# Patient Record
Sex: Male | Born: 1955
Health system: Southern US, Community
[De-identification: ages and names within clinical notes are randomized; demographics above are authoritative.]

## PROBLEM LIST (undated history)

## (undated) DIAGNOSIS — D649 Anemia, unspecified: Secondary | ICD-10-CM

## (undated) DIAGNOSIS — Z87442 Personal history of urinary calculi: Secondary | ICD-10-CM

## (undated) DIAGNOSIS — G4730 Sleep apnea, unspecified: Secondary | ICD-10-CM

## (undated) DIAGNOSIS — I10 Essential (primary) hypertension: Secondary | ICD-10-CM

## (undated) DIAGNOSIS — F32A Depression, unspecified: Secondary | ICD-10-CM

## (undated) DIAGNOSIS — F329 Major depressive disorder, single episode, unspecified: Secondary | ICD-10-CM

## (undated) DIAGNOSIS — M1990 Unspecified osteoarthritis, unspecified site: Secondary | ICD-10-CM

## (undated) DIAGNOSIS — E559 Vitamin D deficiency, unspecified: Secondary | ICD-10-CM

## (undated) DIAGNOSIS — G4700 Insomnia, unspecified: Secondary | ICD-10-CM

## (undated) DIAGNOSIS — I5031 Acute diastolic (congestive) heart failure: Secondary | ICD-10-CM

## (undated) DIAGNOSIS — R0600 Dyspnea, unspecified: Secondary | ICD-10-CM

## (undated) HISTORY — PX: CHOLECYSTECTOMY: SHX55

## (undated) HISTORY — DX: Morbid (severe) obesity due to excess calories: E66.01

## (undated) HISTORY — PX: HERNIA REPAIR: SHX51

## (undated) HISTORY — DX: Insomnia, unspecified: G47.00

## (undated) HISTORY — PX: ANKLE SURGERY: SHX546

## (undated) HISTORY — PX: APPENDECTOMY: SHX54

---
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
## 1998-07-04 ENCOUNTER — Ambulatory Visit (HOSPITAL_COMMUNITY): Admission: RE | Admit: 1998-07-04 | Discharge: 1998-07-04 | Payer: Self-pay | Admitting: Family Medicine

## 1998-07-10 ENCOUNTER — Encounter: Admission: RE | Admit: 1998-07-10 | Discharge: 1998-10-08 | Payer: Self-pay | Admitting: Family Medicine

## 1999-09-28 ENCOUNTER — Ambulatory Visit (HOSPITAL_BASED_OUTPATIENT_CLINIC_OR_DEPARTMENT_OTHER): Admission: RE | Admit: 1999-09-28 | Discharge: 1999-09-28 | Payer: Self-pay | Admitting: Urology

## 2004-06-15 ENCOUNTER — Emergency Department (HOSPITAL_COMMUNITY): Admission: EM | Admit: 2004-06-15 | Discharge: 2004-06-15 | Payer: Self-pay | Admitting: Family Medicine

## 2007-08-28 ENCOUNTER — Emergency Department (HOSPITAL_COMMUNITY): Admission: EM | Admit: 2007-08-28 | Discharge: 2007-08-28 | Payer: Self-pay | Admitting: Family Medicine

## 2007-09-14 ENCOUNTER — Encounter: Admission: RE | Admit: 2007-09-14 | Discharge: 2007-09-14 | Payer: Self-pay | Admitting: Gastroenterology

## 2008-05-09 ENCOUNTER — Emergency Department (HOSPITAL_COMMUNITY): Admission: EM | Admit: 2008-05-09 | Discharge: 2008-05-09 | Payer: Self-pay | Admitting: Emergency Medicine

## 2009-05-31 ENCOUNTER — Emergency Department (HOSPITAL_COMMUNITY): Admission: EM | Admit: 2009-05-31 | Discharge: 2009-05-31 | Payer: Self-pay

## 2011-02-15 NOTE — Op Note (Signed)
[place]. [facility]  Patient:    Jeffrey Skinner                         MRN: [id number] Proc. Date: 09/28/99 Adm. Date:  [id number] Attending:  Lindaann Slough                           Operative Report  PREOPERATIVE DIAGNOSIS:  Elective sterilization.  POSTOPERATIVE DIAGNOSIS:  Elective sterilization.  PROCEDURE:  Bilateral vasectomy.  SURGEON:  Lindaann Slough, M.D.  ANESTHESIA:  General.  INDICATIONS:  Patient is a 55 year old male, who would like to have a vasectomy. He was scheduled for the procedure in the office, but because of discomfort in he scrotal area, the procedure was scheduled to be done under anesthesia.  Patient has two children and the risks and benefits of the procedure were discussed with him and his wife and they are agreeable.  DESCRIPTION OF PROCEDURE:  Under general anesthesia, patient was prepped and draped and placed in the supine position.  A 1 cm incision was made on the right scrotum. The incision was carried down to the subcutaneous tissues.  The vas was secured  with an Allis clamp and brought out through the wound.  The vas was then dissected from the surrounding tissues.  A segment of the vas was excised.  The ends of the vas were fulgurated.  Each end of the vas was then reversed on each end and doubly ligated with #0 Vicryl.  The proximal end of the vas was then covered with subcutaneous tissues and the distal end of the vas was placed under the skin. he skin was then closed with #3-0 Vicryl.  The same procedure was done on the left  side.  The patient tolerated the procedure well and left the OR in satisfactory condition to post anesthesia care unit. DD:  09/28/99 TD:  09/29/99 Job: [id number]

## 2011-06-28 LAB — WOUND CULTURE

## 2017-01-08 ENCOUNTER — Emergency Department (HOSPITAL_COMMUNITY): Payer: 59

## 2017-01-08 ENCOUNTER — Observation Stay (HOSPITAL_COMMUNITY)
Admission: EM | Admit: 2017-01-08 | Discharge: 2017-01-10 | Disposition: A | Discharge status: Home / Self Care | Payer: 59 | Attending: Oncology | Admitting: Oncology

## 2017-01-08 ENCOUNTER — Encounter (HOSPITAL_COMMUNITY): Payer: Self-pay | Admitting: Emergency Medicine

## 2017-01-08 ENCOUNTER — Encounter (HOSPITAL_COMMUNITY): Payer: Self-pay

## 2017-01-08 ENCOUNTER — Ambulatory Visit (HOSPITAL_COMMUNITY)
Admission: EM | Admit: 2017-01-08 | Discharge: 2017-01-08 | Disposition: A | Discharge status: Nursing Home (Includes ICF) | Payer: 59 | Source: Home / Self Care

## 2017-01-08 DIAGNOSIS — R0603 Acute respiratory distress: Secondary | ICD-10-CM

## 2017-01-08 DIAGNOSIS — Z6841 Body Mass Index (BMI) 40.0 and over, adult: Secondary | ICD-10-CM | POA: Diagnosis not present

## 2017-01-08 DIAGNOSIS — G4730 Sleep apnea, unspecified: Secondary | ICD-10-CM | POA: Diagnosis not present

## 2017-01-08 DIAGNOSIS — R000 Tachycardia, unspecified: Secondary | ICD-10-CM | POA: Diagnosis not present

## 2017-01-08 DIAGNOSIS — R0602 Shortness of breath: Secondary | ICD-10-CM

## 2017-01-08 DIAGNOSIS — J9691 Respiratory failure, unspecified with hypoxia: Principal | ICD-10-CM | POA: Diagnosis present

## 2017-01-08 DIAGNOSIS — I10 Essential (primary) hypertension: Secondary | ICD-10-CM | POA: Insufficient documentation

## 2017-01-08 DIAGNOSIS — R0902 Hypoxemia: Secondary | ICD-10-CM

## 2017-01-08 LAB — CBC
HEMATOCRIT: 47.7 % (ref 39.0–52.0)
HEMOGLOBIN: 14.8 g/dL (ref 13.0–17.0)
MCH: 29.1 pg (ref 26.0–34.0)
MCHC: 31 g/dL (ref 30.0–36.0)
MCV: 93.7 fL (ref 78.0–100.0)
Platelets: 199 10*3/uL (ref 150–400)
RBC: 5.09 MIL/uL (ref 4.22–5.81)
RDW: 14.1 % (ref 11.5–15.5)
WBC: 10.4 10*3/uL (ref 4.0–10.5)

## 2017-01-08 LAB — BASIC METABOLIC PANEL
ANION GAP: 7 (ref 5–15)
BUN: 7 mg/dL (ref 6–20)
CHLORIDE: 98 mmol/L — AB (ref 101–111)
CO2: 33 mmol/L — ABNORMAL HIGH (ref 22–32)
Calcium: 8.7 mg/dL — ABNORMAL LOW (ref 8.9–10.3)
Creatinine, Ser: 1.05 mg/dL (ref 0.61–1.24)
GFR calc Af Amer: >60 mL/min (ref >60)
GLUCOSE: 142 mg/dL — AB (ref 65–99)
POTASSIUM: 3.9 mmol/L (ref 3.5–5.1)
Sodium: 138 mmol/L (ref 135–145)

## 2017-01-08 LAB — BRAIN NATRIURETIC PEPTIDE: B NATRIURETIC PEPTIDE 5: 10.5 pg/mL (ref 0.0–100.0)

## 2017-01-08 LAB — I-STAT TROPONIN, ED: Troponin i, poc: 0 ng/mL (ref 0.00–0.08)

## 2017-01-08 MED ORDER — ALBUTEROL SULFATE (2.5 MG/3ML) 0.083% IN NEBU
INHALATION_SOLUTION | RESPIRATORY_TRACT | Status: AC
  Filled 2017-01-08: qty 3

## 2017-01-08 MED ORDER — SODIUM CHLORIDE 0.9 % IN NEBU
INHALATION_SOLUTION | RESPIRATORY_TRACT | Status: AC
  Filled 2017-01-08: qty 3

## 2017-01-08 MED ORDER — IPRATROPIUM-ALBUTEROL 0.5-2.5 (3) MG/3ML IN SOLN
3.0000 mL | Freq: Once | RESPIRATORY_TRACT | Status: AC
  Administered 2017-01-08: 3 mL via RESPIRATORY_TRACT
  Filled 2017-01-08: qty 3

## 2017-01-08 MED ORDER — IOPAMIDOL (ISOVUE-370) INJECTION 76%
INTRAVENOUS | Status: AC
  Administered 2017-01-08: 100 mL
  Filled 2017-01-08: qty 100

## 2017-01-08 MED ORDER — ALBUTEROL SULFATE (2.5 MG/3ML) 0.083% IN NEBU
2.5000 mg | INHALATION_SOLUTION | Freq: Once | RESPIRATORY_TRACT | Status: AC
  Administered 2017-01-08: 2.5 mg via RESPIRATORY_TRACT

## 2017-01-08 NOTE — ED Provider Notes (Signed)
CSN: [id number]     Arrival date & time 01/08/17  1617 History   None    Chief Complaint  Patient presents with  . Shortness of Breath   (Consider location/radiation/quality/duration/timing/severity/associated sxs/prior Treatment) Patient c/o severe SOB.  He states he has had breathing problems for last 6 months.  He states he has come in because his wife has told him he needs to see a doctor.  He has severe sleep apnea untreated and he has developed worsening breathing problems over last few days.  He states he is afraid to go to sleep.   The history is provided by the patient.  Shortness of Breath  Severity:  Severe Onset quality:  Sudden Progression:  Worsening Chronicity:  New Relieved by:  None tried Worsened by:  Nothing Ineffective treatments:  None tried   History reviewed. No pertinent past medical history. Past Surgical History:  Procedure Laterality Date  . APPENDECTOMY    . HERNIA REPAIR     No family history on file. Social History  Substance Use Topics  . Smoking status: Never Smoker  . Smokeless tobacco: Not on file  . Alcohol use Yes    Review of Systems  Constitutional: Negative.   HENT: Negative.   Eyes: Negative.   Respiratory: Positive for shortness of breath.   Cardiovascular: Negative.   Gastrointestinal: Negative.   Endocrine: Negative.   Genitourinary: Negative.   Musculoskeletal: Negative.   Allergic/Immunologic: Negative.   Neurological: Negative.   Hematological: Negative.     Allergies  Patient has no known allergies.  Home Medications   Prior to Admission medications   Not on File   Meds Ordered and Administered this Visit   Medications  albuterol (PROVENTIL) (2.5 MG/3ML) 0.083% nebulizer solution 2.5 mg (2.5 mg Nebulization Given 01/08/17 1650)    Resp (!) 32 Comment: after walking  SpO2 97% Comment: with treatment No data found.   Physical Exam  Constitutional: He is oriented to person, place, and time. He appears  well-developed and well-nourished.  HENT:  Head: Normocephalic and atraumatic.  Eyes: Conjunctivae and EOM are normal. Pupils are equal, round, and reactive to light.  Neck: Normal range of motion. Neck supple.  Cardiovascular: Normal rate, regular rhythm and normal heart sounds.   Pulmonary/Chest: He is in respiratory distress.  Bilateral Breath sounds diminished throughout and patient has tachypnea.  Abdominal: Soft. Bowel sounds are normal.  Musculoskeletal: Normal range of motion.  Neurological: He is alert and oriented to person, place, and time.  Nursing note and vitals reviewed.   Urgent Care Course     Procedures (including critical care time)  Labs Review Labs Reviewed - No data to display  Imaging Review No results found.   Visual Acuity Review  Right Eye Distance:   Left Eye Distance:   Bilateral Distance:    Right Eye Near:   Left Eye Near:    Bilateral Near:         MDM   1. SOB (shortness of breath)   2. Respiratory distress    Neb treatment with albuterol 2.5/3cc's now  Patient advised he needs to go to ED for higher level of care.    Deatra Canter, FNP 01/08/17 1715

## 2017-01-08 NOTE — Discharge Instructions (Signed)
Go straight to Emergency Room °

## 2017-01-08 NOTE — ED Notes (Signed)
Patient transported to CT 

## 2017-01-08 NOTE — ED Provider Notes (Signed)
Emergency Department Provider Note   I have reviewed the triage vital signs and the nursing notes.   HISTORY  Chief Complaint Shortness of Breath   HPI Jeffrey Skinner is a 61 y.o. male with no PMH and no PCP the emergency room in for evaluation of acute on chronic difficulty breathing. Patient states he's had difficulty breathing over the past year but has become significantly worse over the past one to months. Describes significant dyspnea with even minimal amounts of movement. States that his truck is approximately 30 feet outside his house and by the time he gets there is very winded. He denies any chest pain. No fevers or chills. No productive cough. He has noticed some lower extremity edema that seems new but cannot describe exactly when he first noticed it. He presented to the emergency department today because he describes symptoms to his daughter insisted he go. 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  History reviewed. No pertinent past medical history.  There are no active problems to display for this patient.   Past Surgical History:  Procedure Laterality Date  . APPENDECTOMY    . HERNIA REPAIR        Allergies Patient has no known allergies.  No family history on file.  Social History Social History  Substance Use Topics  . Smoking status: Never Smoker  . Smokeless tobacco: Never Used  . Alcohol use Yes    Review of Systems  Constitutional: No fever/chills Eyes: No visual changes. ENT: No sore throat. Cardiovascular: Denies chest pain. Respiratory: Positive shortness of breath. Gastrointestinal: No abdominal pain.  No nausea, no vomiting.  No diarrhea.  No constipation. Genitourinary: Negative for dysuria. Musculoskeletal: Negative for back pain. Positive LE edema.  Skin: Negative for rash. Neurological: Negative for headaches, focal weakness or numbness.  10-point ROS otherwise  negative.  ____________________________________________   PHYSICAL EXAM:  VITAL SIGNS: ED Triage Vitals  Enc Vitals Group     BP 01/08/17 1726 140/81     Pulse Rate 01/08/17 1726 (!) 103     Resp --      Temp 01/08/17 1726 97.7 F (36.5 C)     SpO2 01/08/17 1726 90 %     Pain Score 01/08/17 1844 4   Constitutional: Alert and oriented. Well appearing and in no acute distress. Obese. Becomes dyspneic with talking or moving in bed.  Eyes: Conjunctivae are normal.  Head: Atraumatic. Nose: No congestion/rhinnorhea. Mouth/Throat: Mucous membranes are moist.   Neck: No stridor.  Cardiovascular: Tachcycardia. Good peripheral circulation. Grossly normal heart sounds.   Respiratory: Normal respiratory effort.  No retractions. Lungs CTAB. Gastrointestinal: Soft and nontender. No distention.  Musculoskeletal: No lower extremity tenderness nor edema. No gross deformities of extremities. Neurologic:  Normal speech and language. No gross focal neurologic deficits are appreciated.  Skin:  Skin is warm, dry and intact. No rash noted.  ____________________________________________   LABS (all labs ordered are listed, but only abnormal results are displayed)  Labs Reviewed  BASIC METABOLIC PANEL - Abnormal; Notable for the following:       Result Value   Chloride 98 (*)    CO2 33 (*)    Glucose, Bld 142 (*)    Calcium 8.7 (*)    All other components within normal limits  CBC  BRAIN NATRIURETIC PEPTIDE  I-STAT TROPOININ, ED   ____________________________________________  EKG   EKG Interpretation  Date/Time:  Wednesday January 08 2017  17:37:14 EDT Ventricular Rate:  107 PR Interval:  148 QRS Duration: 84 QT Interval:  342 QTC Calculation: 456 R Axis:   -5 Text Interpretation:  Sinus tachycardia Nonspecific T wave abnormality Abnormal ECG No STEMI.  Confirmed by Jinx Gilden MD, Hannahgrace Lalli [phone]) on 01/08/2017 8:07:02 PM        ____________________________________________  RADIOLOGY  Dg Chest 2 View  Result Date: 01/08/2017 CLINICAL DATA:  Shortness of breath x6 months EXAM: CHEST  2 VIEW COMPARISON:  None. FINDINGS: Lungs are clear.  No pleural effusion or pneumothorax. The heart is normal in size. Visualized osseous structures are within normal limits. IMPRESSION: Normal chest radiographs. Electronically Signed   By: Charline Bills M.D.   On: 01/08/2017 18:22   Ct Angio Chest Pe W And/or Wo Contrast  Result Date: 01/08/2017 CLINICAL DATA:  Chronic shortness of breath.  Initial encounter. EXAM: CT ANGIOGRAPHY CHEST WITH CONTRAST TECHNIQUE: Multidetector CT imaging of the chest was performed using the standard protocol during bolus administration of intravenous contrast. Multiplanar CT image reconstructions and MIPs were obtained to evaluate the vascular anatomy. The study was repeated due to limitations in the timing of the contrast bolus. CONTRAST:  175 mL of Isovue 370 IV contrast COMPARISON:  Chest radiograph performed earlier today at 6:08 p.m. FINDINGS: Cardiovascular: There is no evidence of central pulmonary embolus. Evaluation for pulmonary embolus is suboptimal due to limitations in the timing of the contrast bolus. The heart is borderline normal in size. The great vessels are grossly unremarkable in appearance. The thoracic aorta is grossly unremarkable. Mediastinum/Nodes: Calcified mediastinal nodes are noted at the azygoesophageal recess. No mediastinal lymphadenopathy is seen. The visualized portions of the thyroid gland are unremarkable. No axial lymphadenopathy is appreciated. Lungs/Pleura: Mild bilateral atelectasis is noted. No pleural effusion or pneumothorax is seen. A calcified granuloma is noted at the right lung base. No masses are identified. Upper Abdomen: The visualized portions of the liver and spleen are unremarkable. The visualized portions of the gallbladder, pancreas, adrenal glands and  kidneys are within normal limits. Musculoskeletal: No acute osseous abnormalities are identified. The visualized musculature is unremarkable in appearance. Review of the MIP images confirms the above findings. IMPRESSION: 1. No evidence of central pulmonary embolus. 2. Mild bilateral atelectasis noted. 3. Calcified mediastinal nodes likely reflect remote granulomatous disease. Electronically Signed   By: Roanna Raider M.D.   On: 01/08/2017 23:29    ____________________________________________   PROCEDURES  Procedure(s) performed:   Procedures  None ____________________________________________   INITIAL IMPRESSION / ASSESSMENT AND PLAN / ED COURSE  Pertinent labs & imaging results that were available during my care of the patient were reviewed by me and considered in my medical decision making (see chart for details).  Patient presents to the emergency department for evaluation of acute on chronic dyspnea. He is not a primary care physician and no history of asthma, COPD, congestive heart failure. No chest pain symptoms. Has a large body habitus which may be driving some of his respiratory difficulties. He was 88% on room air in the urgent care and started on oxygen and transferred here to the emergency department. He has very faint mostly right upper old expiratory wheezing. No smoking history. Plan for DuoNeb, chest x-ray, additional lab work including BNP.   08:08 PM Lab work is largely unremarkable. Patient ambulated with pulse ox and oxygen dropped to 84% on room air after his DuoNeb treatment. No chest pain. The patient has notable dyspnea. Plan for CT angiogram the chest given  otherwise unremarkable workup and hypoxemia of unknown etiology.  11:39 PM Patient with normal CT angio. Hypoxemia with any significant movement or ambulation. Lowest O2 sat or 84% with ambulation. Plan feeling slightly better after Duonebs so unclear if there is an underlying COPD component vs new CHF.    Discussed patient's case with IM teaching team. Patient and family (if present) updated with plan. Care transferred to IM teaching service.  I reviewed all nursing notes, vitals, pertinent old records, EKGs, labs, imaging (as available).  ____________________________________________  FINAL CLINICAL IMPRESSION(S) / ED DIAGNOSES  Final diagnoses:  Hypoxia  SOB (shortness of breath)     MEDICATIONS GIVEN DURING THIS VISIT:  Medications  ipratropium-albuterol (DUONEB) 0.5-2.5 (3) MG/3ML nebulizer solution 3 mL (not administered)  ipratropium-albuterol (DUONEB) 0.5-2.5 (3) MG/3ML nebulizer solution 3 mL (3 mLs Nebulization Given 01/08/17 1908)  iopamidol (ISOVUE-370) 76 % injection (100 mLs  Contrast Given 01/08/17 2228)     NEW OUTPATIENT MEDICATIONS STARTED DURING THIS VISIT:  None   Note:  This document was prepared using Dragon voice recognition software and may include unintentional dictation errors.  Alona Bene, MD Emergency Medicine   Maia Plan, MD 01/09/17 0005

## 2017-01-08 NOTE — ED Notes (Signed)
Patient denies a plan to harm self.  Patient just desperate for a change, for feeling better, for losing weight.  "anything's got to be better than this"

## 2017-01-08 NOTE — ED Triage Notes (Signed)
Pt reports feeling short of breath for six months. Reports it is starting to scare him. Denies any pain. Reports some mild swelling in his ankles.  Patient is in no apparent distress in triage. Was 90% on RA and 96% once placed on 2 l [place].

## 2017-01-08 NOTE — ED Triage Notes (Signed)
Patient sob and audible upper airway wheezing while ambulating from lobby to treatment room.  Notified provider.    Patient has been sob for 6 months, for the last 2 months has been "afraid to go to sleep" due to breathing concerns. patient in department today due to daughter nagging him to come.   Denies chest pain.

## 2017-01-09 DIAGNOSIS — R0602 Shortness of breath: Secondary | ICD-10-CM | POA: Insufficient documentation

## 2017-01-09 DIAGNOSIS — R0683 Snoring: Secondary | ICD-10-CM | POA: Diagnosis not present

## 2017-01-09 DIAGNOSIS — J9691 Respiratory failure, unspecified with hypoxia: Secondary | ICD-10-CM | POA: Diagnosis present

## 2017-01-09 DIAGNOSIS — Z809 Family history of malignant neoplasm, unspecified: Secondary | ICD-10-CM | POA: Diagnosis not present

## 2017-01-09 DIAGNOSIS — R0902 Hypoxemia: Secondary | ICD-10-CM

## 2017-01-09 DIAGNOSIS — I10 Essential (primary) hypertension: Secondary | ICD-10-CM | POA: Diagnosis not present

## 2017-01-09 LAB — BASIC METABOLIC PANEL
Anion gap: 7 (ref 5–15)
BUN: 7 mg/dL (ref 6–20)
CHLORIDE: 99 mmol/L — AB (ref 101–111)
CO2: 33 mmol/L — AB (ref 22–32)
CREATININE: 0.85 mg/dL (ref 0.61–1.24)
Calcium: 8.4 mg/dL — ABNORMAL LOW (ref 8.9–10.3)
GFR calc non Af Amer: >60 mL/min (ref >60)
Glucose, Bld: 108 mg/dL — ABNORMAL HIGH (ref 65–99)
Potassium: 4.1 mmol/L (ref 3.5–5.1)
Sodium: 139 mmol/L (ref 135–145)

## 2017-01-09 LAB — LIPID PANEL
Cholesterol: 157 mg/dL (ref 0–200)
HDL: 40 mg/dL — AB (ref >40)
LDL CALC: 108 mg/dL — AB (ref 0–99)
TRIGLYCERIDES: 45 mg/dL (ref <150)
Total CHOL/HDL Ratio: 3.9 RATIO
VLDL: 9 mg/dL (ref 0–40)

## 2017-01-09 LAB — CBC
HCT: 46.4 % (ref 39.0–52.0)
Hemoglobin: 14.2 g/dL (ref 13.0–17.0)
MCH: 28.8 pg (ref 26.0–34.0)
MCHC: 30.6 g/dL (ref 30.0–36.0)
MCV: 94.1 fL (ref 78.0–100.0)
PLATELETS: 189 10*3/uL (ref 150–400)
RBC: 4.93 MIL/uL (ref 4.22–5.81)
RDW: 14.5 % (ref 11.5–15.5)
WBC: 10.4 10*3/uL (ref 4.0–10.5)

## 2017-01-09 LAB — TROPONIN I
Troponin I: <0.03 ng/mL (ref <0.03)
Troponin I: <0.03 ng/mL (ref <0.03)

## 2017-01-09 LAB — HEMOGLOBIN A1C
Hgb A1c MFr Bld: 5.5 % (ref 4.8–5.6)
Mean Plasma Glucose: 111 mg/dL

## 2017-01-09 LAB — MRSA PCR SCREENING: MRSA BY PCR: NEGATIVE

## 2017-01-09 LAB — TSH: TSH: 2.692 u[IU]/mL (ref 0.350–4.500)

## 2017-01-09 LAB — HIV ANTIBODY (ROUTINE TESTING W REFLEX): HIV SCREEN 4TH GENERATION: NONREACTIVE

## 2017-01-09 MED ORDER — LISINOPRIL 10 MG PO TABS
10.0000 mg | ORAL_TABLET | Freq: Every day | ORAL | Status: DC
  Administered 2017-01-09 – 2017-01-10 (×3): 10 mg via ORAL
  Filled 2017-01-09 (×3): qty 1

## 2017-01-09 MED ORDER — ENOXAPARIN SODIUM 120 MG/0.8ML ~~LOC~~ SOLN
110.0000 mg | SUBCUTANEOUS | Status: DC
  Administered 2017-01-09: 110 mg via SUBCUTANEOUS
  Filled 2017-01-09 (×2): qty 0.73

## 2017-01-09 MED ORDER — SENNOSIDES-DOCUSATE SODIUM 8.6-50 MG PO TABS
1.0000 | ORAL_TABLET | Freq: Every evening | ORAL | Status: DC | PRN
  Filled 2017-01-09: qty 1

## 2017-01-09 MED ORDER — SODIUM CHLORIDE 0.9% FLUSH
3.0000 mL | Freq: Two times a day (BID) | INTRAVENOUS | Status: DC
  Administered 2017-01-09 – 2017-01-10 (×2): 3 mL via INTRAVENOUS

## 2017-01-09 MED ORDER — ALBUTEROL SULFATE (2.5 MG/3ML) 0.083% IN NEBU: 2.5000 mg | INHALATION_SOLUTION | RESPIRATORY_TRACT | Status: DC | PRN

## 2017-01-09 MED ORDER — PREDNISONE 20 MG PO TABS
40.0000 mg | ORAL_TABLET | Freq: Every day | ORAL | Status: DC
  Administered 2017-01-09 – 2017-01-10 (×2): 40 mg via ORAL
  Filled 2017-01-09 (×2): qty 2

## 2017-01-09 MED ORDER — IPRATROPIUM-ALBUTEROL 0.5-2.5 (3) MG/3ML IN SOLN
3.0000 mL | Freq: Four times a day (QID) | RESPIRATORY_TRACT | Status: DC
  Administered 2017-01-09 (×3): 3 mL via RESPIRATORY_TRACT
  Filled 2017-01-09 (×3): qty 3

## 2017-01-09 MED ORDER — ACETAMINOPHEN 325 MG PO TABS: 650.0000 mg | ORAL_TABLET | Freq: Four times a day (QID) | ORAL | Status: DC | PRN

## 2017-01-09 MED ORDER — ACETAMINOPHEN 650 MG RE SUPP: 650.0000 mg | Freq: Four times a day (QID) | RECTAL | Status: DC | PRN

## 2017-01-09 MED ORDER — IPRATROPIUM-ALBUTEROL 0.5-2.5 (3) MG/3ML IN SOLN
3.0000 mL | Freq: Two times a day (BID) | RESPIRATORY_TRACT | Status: DC
Start: 2017-01-10 — End: 2017-01-10
  Administered 2017-01-10: 3 mL via RESPIRATORY_TRACT
  Filled 2017-01-09: qty 3

## 2017-01-09 NOTE — Progress Notes (Signed)
Patient admitted to Room 35 from ED via stretcher, self ambulated to bed. Is being admitted for SOB X 6 months, and worse in the last two months.  This 60 year old male is alert and oriented X 4, lives at home with wife. Armband verified. Placed on telemetry per order, box number 18, and had a second verifier.  Has a LAC IV which is saline locked.  Currently on 2 L 02 via nasal cannula. He is to have routine vitals and daily weights.  MRSA swab sent to lab as he has a history of MRSA. Test Pending. Patient weighs 511 lbs. Bariatric bed will be ordered.  Safety maintained.

## 2017-01-09 NOTE — H&P (Signed)
Date: 01/09/2017               Patient Name:  Jeffrey Skinner MRN: [id number]  DOB: 1956-08-14 Age / Sex: 61 y.o., male   PCP: No Pcp Per Patient         Medical Service: Internal Medicine Teaching Service         Attending Physician: Dr. Levert Feinstein, MD    First Contact: Dr. Reymundo Poll  Pager: [phone]  Second Contact: Dr. Deneise Lever Pager: [phone]       After Hours (After 5p/  First Contact Pager: [phone]  weekends / holidays): Second Contact Pager: [phone]   Chief Complaint: Shortness of breath  History of Present Illness: The patient is a 61 year old obese African-American male with no significant past medical history who presents to the emergency department with shortness of breath. The patient states that he has had shortness of breath for the prior 6 months. He describes his shortness of breath as episodes that occur with walking from his house to his truck and from his truck back to his house. These episodes of shortness of breath last between 3 and 5 minutes. There is no associated cough. They are improved with rest. He states they have gotten more frequent and severe over the previous 2 months. He presents to the emergency department today as his daughter persuaded him to come. His symptoms are not currently acutely worse than they have been over the previous 2 months. He denies chest pain, tightness or pressure with these episodes. He does state that he has been told he snores very loudly at night and has episodes where it appears that he stops breathing. He has no history of asthma, allergies or eczema as a child. He denies headache or changes in vision. He denies nausea, vomiting or abdominal pain. He denies diarrhea or constipation. He denies night sweats, fatigue or weight loss. 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  In the emergency department the patient was hypertensive with a blood pressure 146/84, and  tachypneic with a respiratory rate ranging from 18-25, satting 95-98% on 3 L oxygen via nasal cannula. Basic metabolic panel showed an elevated bicarbonate 33 and glucose at 142. CBC was within normal limits. I-STAT troponin negative. BNP not elevated. A two-view diagnostic chest radiograph showed no acute cardiopulmonary abnormality. CT angiography chest showed no evidence of central pulmonary embolism, mild bilateral atelectasis noted and calcified mediastinal nodes which may reflect remote granulomatous disease. He was treated with duo nebulizers and given 40 mg of oral prednisone. He was then admitted to the teaching service for further workup and management.  Meds:  The patient takes no daily medications.   Allergies: Allergies as of 01/08/2017  . (No Known Allergies)   History reviewed. No pertinent past medical history.  Family History: No family history of asthma. Family history of cancer in the patient's mother and brother although he does not remember the type.   Social History: Denies tobacco or illicit drug use. Drinks socially.  Review of Systems: A complete ROS was negative except as per HPI.   Physical Exam: Blood pressure (!) 146/84, pulse 88, temperature 97.7 F (36.5 C), resp. rate 18, SpO2 96 %. Physical Exam  Constitutional: He is oriented to person, place, and time. He appears well-developed and well-nourished.  Morbidly obese  HENT:  Head: Normocephalic and atraumatic.  Nasal cannula in place  Cardiovascular:  Normal rate and regular rhythm.  Exam reveals no gallop and no friction rub.   No murmur heard. Heart sounds distant and difficult to auscultate secondary to patient's body habitus  Respiratory: He has wheezes.  Bilateral expiratory wheezes  GI: Soft. Bowel sounds are normal. He exhibits no distension.  Musculoskeletal: He exhibits edema.  Bilateral lower extremity edema  Neurological: He is alert and oriented to person, place, and time.     EKG:  Sinus tachycardia, no acute ST segment elevation  CXR: No acute cardiopulmonary abnormality  Assessment & Plan by Problem: Active Problems:   Respiratory failure with hypoxia [facility]) The patient is a 61 year old morbidly obese African-American gentleman with no significant past medical history who presents with a 6 month history of exertional shortness of breath.  # Hypoxic respiratory failure with unspecified chronicity Patient has a six-month history of shortness of breath. No history of asthma. Plain film of the chest normal. CT angiography chest negative for pulmonary embolism. Physical examination shows morbidly obese body habitus and bilateral expiratory wheeze. Patient does have occupational exposure to burned wood and cole. He also has a history of snoring with episodes where it appears he is not breathing. The exact etiology of his respiratory failure is unclear at this time although I suspect components of both obstructive sleep apnea and obesity hypoventilation syndrome. Additionally, the patient may have reactive airway disease secondary to his occupational exposure. Also, although less likely he may have late onset asthma. CT findings showed calcified lymphadenopathy suggestive of prior granulomatous disease. This may also be playing a role in the patient's presentation. COPD is also on the differential given the patient's occupational exposure. Patient does have bilateral lower extremity edema and history of dyspnea on exertion. He may have some component of heart failure although his BNP is normal. I think he would also benefit from an echo while inpatient. -- Duo nebs scheduled every 6 hours -- Albuterol every 2 hours as needed for wheezing -- Prednisone 40 mg 5 days -- Echocardiogram -- Would benefit from sleep study in the outpatient setting -- Would benefit from pulmonary function testing in the outpatient setting  # Probable obstructive sleep apnea # Probable obesity  hypoventilation syndrome Patient's history and body habitus suggestive of both obstructive sleep apnea and obesity hypoventilation syndrome. These etiologies may be contributing to his hypertension and also makes it more difficult to lose weight. They may also be contributing to his chronic daily shortness of breath. -- Outpatient workup  # Hypertension Currently, does not carry a diagnosis of hypertension. He is hypertensive since admission. -- Lisinopril 10 mg daily -- Hemoglobin A1c -- Lipid panel  DVT/PE prophylaxis: Lovenox FEN/GI: Normal diet Code: Full code  Dispo: Admit patient to Observation with expected length of stay less than 2 midnights.  Signed: Thomasene Lot, MD 01/09/2017, 12:32 AM  Pager: [phone]

## 2017-01-09 NOTE — Progress Notes (Signed)
Called portable and ordered bariatric bed.

## 2017-01-09 NOTE — Progress Notes (Signed)
Called ED to get report on Patient. Spoke to [place], who said he would call me back.  Awaiting call back.

## 2017-01-09 NOTE — Progress Notes (Signed)
PFT lab notified of PFT order.

## 2017-01-09 NOTE — Progress Notes (Signed)
Called portable ordered K pad

## 2017-01-09 NOTE — Progress Notes (Signed)
SATURATION QUALIFICATIONS: (This note is used to comply with regulatory documentation for home oxygen)  Patient Saturations on Room Air at Rest = 87%      

## 2017-01-09 NOTE — Progress Notes (Signed)
   Subjective: Patient was sleeping comfortably on ounces morning. He was snoring very loudly. He awoke easily. He had no complaints, breathing has improved. Inquiring about discharge.   Objective:  Vital signs in last 24 hours: Vitals:   01/09/17 0129 01/09/17 0300 01/09/17 0611 01/09/17 1059  BP: (!) 168/112 (!) 153/88 (!) 156/63 (!) 154/52  Pulse: 87 79 85   Resp: Temp: 97.8 F (36.6 C) 97.7 F (36.5 C) 98.6 F (37 C)   TempSrc:  Oral Oral   SpO2: 98% 97% 96%   Weight: (!) 511 lb (231.8 kg) (!) 511 lb (231.8 kg)    Height:  (1.956 m)  (1.956 m)     Physical Exam Constitutional: Morbidly obese, NAD, appears comfortable HEENT: Atraumatic, normocephalic. PERRL, anicteric sclera.  Neck: Supple, trachea midline.  Cardiovascular: Distant heart sounds, RRR, no murmurs, rubs, or gallops.  Pulmonary/Chest: Limited due to habitus but CTAB Abdominal: Soft, non tender, distended due to habitus. +BS.  Extremities: Warm and well perfused. Non pitting lower extremity edema. Neurological: A&Ox3, CN II - XII grossly intact.   Assessment/Plan:  The patient is a 61 year old morbidly obese African-American gentleman with no significant past medical history who presents with a 6 month history of exertional shortness of breath.  Hypoxic Respiratory Failure: Unclear chronicity. Patient presented to the ED with 6 month history of SOB and dyspnea on exertion. On arrival to the ED he was noted to be hypoxic to 88% and tachypneic with RR 15-25. Oxygenation improved to 95-98% on 3L [facility]. Chest xray was unremarkable and CTA chest was negative for acute PE, but notable for calcified mediastinal nodes. He was also found to have bilateral wheezing on exam and was treated with albuterol and duonebs. 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 He is also morbidly obese and endorses  symptoms concerning for OSA (snoring, headaches, and intermittent confusion in the mornings). Give his mediastinal nodes, sarcoidosis is certainly on the differential as well. For now we will continue with breathing treatments, obtain PFTs, and plan for outpatient sleep study after discharge.  -- Wean oxygen as tolerated -- Albuterol q2 prn  -- Duonebs q6 scheduled -- Prednisone 40 mg x 5 days  -- Echocardiogram  -- PFTs  -- Will refer for sleep study on discharge   HTN: Elevated BP on admission, does not take any medications at home and has no formal HTN diagnosis.  -- Continue lisinopril 10 mg daily  -- A1C pending  -- Lipid panel >> Total cholesterol 157, HDL 40, LDL 108   FEN: No fluids, replete lytes prn, regular diet VTE ppx: Lovenox  Code Status: FULL   Dispo: Anticipated discharge in approximately 1-2 day(s).   Reymundo Poll, MD 01/09/2017, 1:03 PM Pager: [phone]

## 2017-01-10 ENCOUNTER — Telehealth: Payer: Self-pay | Admitting: Internal Medicine

## 2017-01-10 ENCOUNTER — Observation Stay (HOSPITAL_BASED_OUTPATIENT_CLINIC_OR_DEPARTMENT_OTHER): Payer: 59

## 2017-01-10 DIAGNOSIS — J9691 Respiratory failure, unspecified with hypoxia: Secondary | ICD-10-CM | POA: Diagnosis not present

## 2017-01-10 DIAGNOSIS — Z9981 Dependence on supplemental oxygen: Secondary | ICD-10-CM | POA: Diagnosis not present

## 2017-01-10 DIAGNOSIS — R0600 Dyspnea, unspecified: Secondary | ICD-10-CM

## 2017-01-10 DIAGNOSIS — I10 Essential (primary) hypertension: Secondary | ICD-10-CM | POA: Diagnosis not present

## 2017-01-10 DIAGNOSIS — Z6841 Body Mass Index (BMI) 40.0 and over, adult: Secondary | ICD-10-CM

## 2017-01-10 MED ORDER — TIOTROPIUM BROMIDE MONOHYDRATE 1.25 MCG/ACT IN AERS: 2.0000 | INHALATION_SPRAY | Freq: Every day | RESPIRATORY_TRACT | 2 refills | Status: DC

## 2017-01-10 MED ORDER — ALBUTEROL SULFATE HFA 108 (90 BASE) MCG/ACT IN AERS: 2.0000 | INHALATION_SPRAY | Freq: Four times a day (QID) | RESPIRATORY_TRACT | 2 refills | Status: DC | PRN

## 2017-01-10 MED ORDER — PREDNISONE 20 MG PO TABS: 40.0000 mg | ORAL_TABLET | Freq: Every day | ORAL | 0 refills | Status: DC

## 2017-01-10 MED ORDER — LISINOPRIL 10 MG PO TABS: 10.0000 mg | ORAL_TABLET | Freq: Every day | ORAL | 1 refills | Status: DC

## 2017-01-10 NOTE — Care Management Note (Signed)
Case Management Note  Patient Details  Name: PERKINS MOLINA MRN: [id number] Date of Birth: 12/28/1955  Subjective/Objective:              Admitted with respiratory failure with hypoxia.      Action/Plan: Plan is to d/c to home today.  Expected Discharge Date:  01/10/17               Expected Discharge Plan:  Home/Self Care  In-House Referral:     Discharge planning Services  CM Consult  Post Acute Care Choice:    Choice offered to:  Patient  DME Arranged:  Oxygen DME Agency:  [facility] (referral made with jermaine @ [phone])  HH Arranged:    HH Agency:     Status of Service:  Completed, signed off  If discussed at [employer] of Stay Meetings, dates discussed:    Additional Comments:  Epifanio Lesches, RN 01/10/2017, 4:25 PM

## 2017-01-10 NOTE — Progress Notes (Signed)
SATURATION QUALIFICATIONS: (This note is used to comply with regulatory documentation for home oxygen)  Patient Saturations on Room Air at Rest = 94%  Patient Saturations on Room Air while Ambulating = 77%  Patient Saturations on 1 Liters of oxygen while Ambulating = 97%

## 2017-01-10 NOTE — Telephone Encounter (Signed)
Needs TOC Discharge date 01/10/17 HFU 01/24/17

## 2017-01-10 NOTE — Progress Notes (Signed)
Jeffrey Skinner to be D/C'd  To home with home health O2 once echo has been completed per MD order. Spoke to MD about a cardiologist not been available to read echo today. MD aware and okay to proceed with discharge. Discussed with the patient and all questions fully answered.  VSS, Skin clean, dry and intact without evidence of skin break down, no evidence of skin tears noted. IV catheter discontinued intact. Site without signs and symptoms of complications. Dressing and pressure applied.  An After Visit Summary was printed and given to the patient. Patient received prescriptions and oxygen to room.  D/c education completed with patient/family including follow up instructions, medication list, d/c activities limitations if indicated, with other d/c instructions as indicated by MD - patient able to verbalize understanding, all questions fully answered.   Patient instructed to return to ED, call 911, or call MD for any changes in condition.   Patient escorted via WC, and D/C home via private auto.  [name] 01/10/2017 6:09 PM

## 2017-01-10 NOTE — Progress Notes (Signed)
  Echocardiogram 2D Echocardiogram has been performed.  Fawaz Borquez T Suzie Vandam 01/10/2017, 6:36 PM

## 2017-01-10 NOTE — Progress Notes (Signed)
   Subjective: Patient feels well this morning. Reports his breathing has improved with the breathing treatments. He is off oxygen. Eager for discharge.   Objective:  Vital signs in last 24 hours: Vitals:   01/09/17 2027 01/09/17 2157 01/10/17 0548 01/10/17 1041  BP:  (!) 131/58 138/76 (!) 160/83  Pulse:  86 85   Resp:  19 18   Temp:  97.9 F (36.6 C) 98.1 F (36.7 C)   TempSrc:   Oral   SpO2: 96% 94%    Weight:      Height:       Physical Exam Constitutional: Morbidly obese, NAD, appears comfortable HEENT: Atraumatic, normocephalic. PERRL, anicteric sclera.  Neck: Supple, trachea midline.  Cardiovascular: Distant heart sounds, RRR, no murmurs, rubs, or gallops.  Pulmonary/Chest: Limited due to habitus but CTAB Abdominal: Soft, non tender, distended due to habitus. +BS.  Extremities: Warm and well perfused. Non pitting lower extremity edema. Neurological: A&Ox3, CN II - XII grossly intact.   Assessment/Plan:  The patient is a 61 year old morbidly obese African-American gentleman with no significant past medical history who presents with a 6 month history of exertional shortness of breath.  Hypoxic Respiratory Failure: Likely multifactorial, unclear chronicity. Patient presented to the ED with 6 month history of SOB and dyspnea on exertion. On arrival to the ED he was noted to be hypoxic to 88% and tachypneic with RR 15-25. Oxygenation improved to 95-98% on 3L [place]. Chest xray was unremarkable and CTA chest was negative for acute PE, but notable for calcified mediastinal nodes. He was also found to have bilateral wheezing on exam and was treated with albuterol and duonebs. 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 He is also morbidly obese and endorses symptoms concerning for OSA (snoring, headaches, and intermittent confusion in the mornings). Give his mediastinal nodes,  sarcoidosis is certainly on the differential as well. Will plan for echocardiogram today, PFTs if possible, and discharge with plans for [facility] follow up and outpatient sleep study.  -- Wean oxygen as tolerated -- Albuterol q2 prn  -- Duonebs q6 scheduled -- Prednisone 40 mg x 5 days  -- Echocardiogram  -- PFTs  -- Will refer for sleep study on discharge   HTN: Elevated BP on admission, does not take any medications at home and has no formal HTN diagnosis.  -- Continue lisinopril 10 mg daily  -- A1C pending  -- Lipid panel >> Total cholesterol 157, HDL 40, LDL 108   FEN: No fluids, replete lytes prn, regular diet VTE ppx: Lovenox  Code Status: FULL   Dispo: Anticipated discharge today after echocardiogram and possible PFTs.   Reymundo Poll, MD 01/10/2017, 12:27 PM Pager: [phone]

## 2017-01-10 NOTE — Discharge Summary (Signed)
Name: Jeffrey Skinner MRN: [id number] DOB: 01-21-1956 61 y.o. PCP: No Pcp Per Patient  Date of Admission: 01/08/2017  5:46 PM Date of Discharge: 01/10/2017 Attending Physician: Levert Feinstein, MD  Discharge Diagnosis: 1. Hypoxic Respiratory Failure  Discharge Medications: Allergies as of 01/10/2017   No Known Allergies     Medication List    TAKE these medications   albuterol 108 (90 Base) MCG/ACT inhaler Commonly known as:  PROVENTIL HFA;VENTOLIN HFA Inhale 2 puffs into the lungs every 6 (six) hours as needed for wheezing or shortness of breath.   lisinopril 10 MG tablet Commonly known as:  PRINIVIL,ZESTRIL Take 1 tablet (10 mg total) by mouth daily. Start taking on:  01/11/2017   predniSONE 20 MG tablet Commonly known as:  DELTASONE Take 2 tablets (40 mg total) by mouth daily with breakfast. Start taking on:  01/11/2017   Tiotropium Bromide Monohydrate 1.25 MCG/ACT Aers Commonly known as:  SPIRIVA RESPIMAT Inhale 2 puffs into the lungs daily.       Disposition and follow-up:   Mr.Jeffrey Skinner was discharged from [facility] in Stable condition.  At the hospital follow up visit please address:  1.  Hypoxic Respiratory Failure: Unclear chronicity, likely multifactorial due to undiagnosed COPD vs. OSA vs. Obesity Hypoventilation Syndrome. 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 He was wheezing on admission and symptoms improved with duonebs and albuterol. Patient was discharged with home oxygen 1L, spiriva daily, prn albuterol, and a prescription for prednisone 40 mg daily x 3 days to complete a 5 day course. Patient will need formal PFTs and  nocturnal polysomnogram. Please order at follow up.   2. HTN: Patient was started on lisinopril 10 mg daily this admission. Please follow up blood pressure and adjust as necessary.     3.  Labs / imaging needed at time of follow-up: Pulmonary Function Testing, Sleep Study   4.  Pending labs/ test needing follow-up: None   Follow-up Appointments: Follow-up Information    [facility]. Go on 01/24/2017.   Why:  at 9:45am for hospital follow up. Our clinic is located on the ground floor of the hospial in the east wing. Please bring all of your medications and arrive 15 minutes early. Thank you!  Contact information: [address] [place] [address] [phone]          Hospital Course by problem list:  1. Hypoxic Respiratory Failure: Of unclear chronicity. Likely multifactorial due to undiagnosed COPD vs. OSA vs. Obesity Hypoventilation Syndrome. Patient presented to the ED with 6 month history of SOB and dyspnea on exertion. On arrival to the ED he was noted to be hypoxic to 88% and tachypneic with RR 15-25. Oxygenation improved to 95-98% on 3L [place]. Chest xray was unremarkable and CTA chest was negative for acute PE, but notable for calcified mediastinal nodes. He was also found to have bilateral wheezing on exam and was treated with albuterol and duonebs. 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 He is also morbidly obese putting him at risk for obesity hypoventilation syndrome. Bicarb was elevated on BMP concerning for underlying  chronic respiratory acidosis. He also endorses symptoms concerning for OSA (snoring, headaches, and intermittent confusion in the mornings). Given his mediastinal nodes, sarcoidosis is certainly on the differential as well. Echocardiogram showed normal systolic function (EF 60-65%), no wall motion abnormalities, and mild concentric LVH. Symptoms improved with breathing treatments. He was discharged with home oxygen 1L [place] due to desaturating to 77% on room air while  ambulating. He was discharged with plans for further outpatient work up with pulmonary function testing and formal nocturnal polysomnogram. If his FEV1 is much higher than 1.0, his presumed chronic respiratory acidosis is quite likely from obesity hypoventilation syndrome and this fact will likely dictate BiPap for OSA rather than CPAP, should this be found. He was discharged on Spiriva daily, albuterol prn, and given a prescription for prednisone 40 mg daily x 3 days to complete a 5 day course.   2. HTN: BP was elevated on admission 170s/90s. He has no formal HTN diagnosis but has not seen a doctor in many years. He was started on lisinopril 10 mg daily this admission. There is likely an underlying component of undiagnosed OSA vs. Obesity hypoventilation syndrome contributing to his elevated BP as well. Please follow up.   Discharge Vitals:   BP (!) 160/83   Pulse 85   Temp 98.1 F (36.7 C) (Oral)   Resp 18   Ht  (1.956 m)   Wt (!) 511 lb (231.8 kg)   SpO2 94%   BMI 60.60 kg/m   Pertinent Labs, Studies, and Procedures:   01/08/2017 CXR 2 View:  IMPRESSION: Normal chest radiographs.  01/08/2017 CTA Chest:  IMPRESSION: 1. No evidence of central pulmonary embolus. 2. Mild bilateral atelectasis noted. 3. Calcified mediastinal nodes likely reflect remote granulomatous disease.  01/10/2017 Echocardiogram:  Study Conclusions - Left ventricle: The cavity size was normal. There was mild   concentric hypertrophy with moderate, focal hypertrophy of the   basal septum. Systolic function was normal. The estimated   ejection fraction was in the range of 60% to 65%. Wall motion was   normal; there were no regional wall motion abnormalities. Left   ventricular diastolic function parameters were normal. - Aortic valve: Transvalvular velocity was within the normal range.   There was no stenosis. There was no regurgitation. - Mitral valve: Transvalvular velocity was within the normal range.    There was no evidence for stenosis. There was no regurgitation. - Right ventricle: The cavity size was normal. Wall thickness was   normal. Systolic function was normal. - Tricuspid valve: There was trivial regurgitation.  Discharge Instructions: Discharge Instructions    Call MD for:  difficulty breathing, headache or visual disturbances    Complete by:  As directed    Call MD for:  persistant dizziness or light-headedness    Complete by:  As directed    Call MD for:  temperature >100.4    Complete by:  As directed    Diet - low sodium heart healthy    Complete by:  As directed    Discharge instructions    Complete by:  As directed    Mr. Tuohy,  It was a pleasure taking care of you. You were started on a medicine for high blood pressure this admission called lisinopril. Please continue to take this daily.  I would also like you to continue to take prednisone 40 mg daily for the next 3 days. I have also discharged you on two inhalers. Please use Spiriva 2 puffs daily in  the morning. You may use albuterol 2 puffs every 6 hours as needed for wheezing and shortness of breath. I have sent prescriptions to your pharmacy. You are scheduled to follow up in the Internal Medicine Clinic on Friday the 27th at 9:45 am. Please keep this appointment for hospital follow up. We will refer you for a sleep study at that time. If you have any questions or concerns, call our clinic at [phone] or after hours call [phone] and ask for the internal medicine resident on call. Thank you!  - Dr. Antony Contras   Increase activity slowly    Complete by:  As directed       Signed: Reymundo Poll, MD 01/10/2017, 1:54 PM   Pager: [phone]

## 2017-01-11 LAB — ECHOCARDIOGRAM COMPLETE
Height: 77 in
Weight: 8176 oz

## 2017-01-24 ENCOUNTER — Ambulatory Visit: Payer: 59

## 2018-02-24 ENCOUNTER — Ambulatory Visit: Payer: 59 | Admitting: Cardiovascular Disease

## 2018-02-25 ENCOUNTER — Encounter: Payer: Self-pay | Admitting: Cardiovascular Disease

## 2018-03-04 ENCOUNTER — Other Ambulatory Visit: Payer: Self-pay

## 2018-03-04 ENCOUNTER — Encounter (HOSPITAL_COMMUNITY): Payer: Self-pay | Admitting: *Deleted

## 2018-03-04 ENCOUNTER — Emergency Department (HOSPITAL_COMMUNITY)
Admission: EM | Admit: 2018-03-04 | Discharge: 2018-03-05 | Disposition: A | Discharge status: Home / Self Care | Payer: 59 | Attending: Emergency Medicine | Admitting: Emergency Medicine

## 2018-03-04 ENCOUNTER — Emergency Department (HOSPITAL_COMMUNITY): Payer: 59

## 2018-03-04 DIAGNOSIS — J449 Chronic obstructive pulmonary disease, unspecified: Secondary | ICD-10-CM | POA: Diagnosis not present

## 2018-03-04 DIAGNOSIS — R0602 Shortness of breath: Secondary | ICD-10-CM | POA: Diagnosis not present

## 2018-03-04 DIAGNOSIS — R079 Chest pain, unspecified: Secondary | ICD-10-CM | POA: Diagnosis present

## 2018-03-04 DIAGNOSIS — Z79899 Other long term (current) drug therapy: Secondary | ICD-10-CM | POA: Diagnosis not present

## 2018-03-04 DIAGNOSIS — R2243 Localized swelling, mass and lump, lower limb, bilateral: Secondary | ICD-10-CM | POA: Diagnosis not present

## 2018-03-04 DIAGNOSIS — R071 Chest pain on breathing: Secondary | ICD-10-CM | POA: Insufficient documentation

## 2018-03-04 LAB — BASIC METABOLIC PANEL
Anion gap: 8 (ref 5–15)
BUN: 7 mg/dL (ref 6–20)
CO2: 29 mmol/L (ref 22–32)
Calcium: 8.8 mg/dL — ABNORMAL LOW (ref 8.9–10.3)
Chloride: 103 mmol/L (ref 101–111)
Creatinine, Ser: 0.94 mg/dL (ref 0.61–1.24)
GFR calc Af Amer: >60 mL/min (ref >60)
GLUCOSE: 127 mg/dL — AB (ref 65–99)
Potassium: 3.8 mmol/L (ref 3.5–5.1)
Sodium: 140 mmol/L (ref 135–145)

## 2018-03-04 LAB — CBC
HCT: 41.5 % (ref 39.0–52.0)
Hemoglobin: 12.8 g/dL — ABNORMAL LOW (ref 13.0–17.0)
MCH: 28.4 pg (ref 26.0–34.0)
MCHC: 30.8 g/dL (ref 30.0–36.0)
MCV: 92 fL (ref 78.0–100.0)
PLATELETS: 258 10*3/uL (ref 150–400)
RBC: 4.51 MIL/uL (ref 4.22–5.81)
RDW: 13.2 % (ref 11.5–15.5)
WBC: 10.7 10*3/uL — ABNORMAL HIGH (ref 4.0–10.5)

## 2018-03-04 LAB — TROPONIN I

## 2018-03-04 LAB — BRAIN NATRIURETIC PEPTIDE: B NATRIURETIC PEPTIDE 5: 11.8 pg/mL (ref 0.0–100.0)

## 2018-03-04 NOTE — ED Triage Notes (Signed)
The pt is c/o chest pain today for 8 hours .  No chest pain now  No sob no dizziness no nausea he was hospitalized  In January for  chf and respiratory failure

## 2018-03-04 NOTE — ED Notes (Signed)
Pt in room and alert with family @ bedside VS documented 

## 2018-03-04 NOTE — ED Provider Notes (Signed)
Patient placed in Quick Look pathway, seen and evaluated   Chief Complaint: chest pain   HPI:   62 year old male with a history of respiratory failure with hypoxia who presents to the emergency department with a chief complaint of chest pain.  The patient endorses left-sided chest pain and left-sided upper back pain.  He reports the pain was constant and began approximately 8 hours prior to arrival.  He reports that the pain has since subsided since he arrived to the ED.  He reports associated dizziness, but reports that this is chronic.  He denies nausea, vomiting, dyspnea, fever, or chills.  The patient's wife also reports that his lower legs have been swelling over the last few days.  He denies orthopnea or anasarca.  ROS: chest pain   Physical Exam:   Gen: No distress  Neuro: Awake and Alert  Skin: Warm    Focused Exam: Heart Is regular rate and rhythm.  No murmurs rubs or gallops.  Lungs are clear to auscultation bilaterally.  Edema noted to the bilateral lower extremities.  No reproducible tenderness to the left shoulder, elbow, or wrist.  No tenderness to the spinous processes of the cervical, thoracic, or lumbar spine or to the bilateral paraspinal muscles.  Anterior chest wall is nontender to palpation.   Initiation of care has begun. The patient has been counseled on the process, plan, and necessity for staying for the completion/evaluation, and the remainder of the medical screening examination    Barkley BoardsMcDonald, Tashi Band A, PA-C 03/04/18 1859    Pricilla LovelessGoldston, Scott, MD 03/09/18 [phone]

## 2018-03-05 ENCOUNTER — Emergency Department (HOSPITAL_COMMUNITY): Payer: 59

## 2018-03-05 LAB — I-STAT TROPONIN, ED: Troponin i, poc: 0 ng/mL (ref 0.00–0.08)

## 2018-03-05 MED ORDER — IOPAMIDOL (ISOVUE-370) INJECTION 76%
INTRAVENOUS | Status: AC
  Filled 2018-03-05: qty 100

## 2018-03-05 MED ORDER — HYDROCODONE-ACETAMINOPHEN 5-325 MG PO TABS: 1.0000 | ORAL_TABLET | Freq: Four times a day (QID) | ORAL | 0 refills | Status: DC | PRN

## 2018-03-05 MED ORDER — IOPAMIDOL (ISOVUE-370) INJECTION 76%
100.0000 mL | Freq: Once | INTRAVENOUS | Status: AC | PRN
Start: 2018-03-05 — End: 2018-03-05
  Administered 2018-03-05: 59 mL via INTRAVENOUS

## 2018-03-05 NOTE — ED Notes (Signed)
Patient transported to CT 

## 2018-03-05 NOTE — Discharge Instructions (Addendum)
Hydrocodone is prescribed as needed for pain.  Return to the emergency department for difficulty breathing, high fever, productive cough, or other new and concerning symptoms.

## 2018-03-05 NOTE — ED Notes (Signed)
Patient verbalizes understanding of discharge instructions. Opportunity for questioning and answers were provided. Armband removed by staff, pt discharged from ED.  

## 2018-03-05 NOTE — ED Notes (Signed)
Pt alert and resting 

## 2018-03-05 NOTE — ED Provider Notes (Signed)
[facility] EMERGENCY DEPARTMENT Provider Note   CSN: [id number] Arrival date & time: 03/04/18  1827     History   Chief Complaint Chief Complaint  Patient presents with  . Chest Pain    HPI Easten QUINDELL SHERE is a 62 y.o. male.  Patient is a 62 year old male with past medical history of obesity, obstructive sleep apnea, and recent admission with prolonged hospitalization for hypoxic respiratory failure.  From reviewing the chart, it seems as though the etiology of his respiratory failure was multifactorial with COPD and obesity hypoventilation playing a role.  He did have a normal echocardiogram during that hospitalization, but it was believed there was also a component of CHF.   He presents today with complaints of chest pain.  This started 2 days ago and is located to the left side of the chest, left upper chest, and left upper back.  It is worse when he moves and breathes.  He denies any fevers or chills.  He denies any hemoptysis.  He does report swelling in both legs.  The history is provided by the patient.  Chest Pain   This is a new problem. The current episode started 2 days ago. The problem occurs constantly. The problem has not changed since onset.The pain is associated with breathing and movement. The pain is present in the lateral region. The pain is moderate. The quality of the pain is described as sharp. The pain does not radiate. Associated symptoms include shortness of breath. Pertinent negatives include no cough, no diaphoresis and no sputum production.    History reviewed. No pertinent past medical history.  Patient Active Problem List   Diagnosis Date Noted  . Respiratory failure with hypoxia (HCC) 01/09/2017  . SOB (shortness of breath)   . Hypoxia     Past Surgical History:  Procedure Laterality Date  . APPENDECTOMY    . HERNIA REPAIR          Home Medications    Prior to Admission medications   Medication Sig Start Date End Date  Taking? Authorizing Provider  amLODipine (NORVASC) 5 MG tablet Take 5 mg by mouth daily. 12/14/17  Yes [provider]  buPROPion (WELLBUTRIN XL) 150 MG 24 hr tablet Take 150 mg by mouth daily. 01/29/18  Yes [provider]  furosemide (LASIX) 80 MG tablet Take 80 mg by mouth daily. 12/14/17  Yes [provider]  ipratropium-albuterol (DUONEB) 0.5-2.5 (3) MG/3ML SOLN Take 3 mLs by nebulization every 6 (six) hours as needed for wheezing. 12/14/17  Yes [provider]  naproxen sodium (ALEVE) 220 MG tablet Take 440 mg by mouth daily as needed (pain).   Yes [provider]  albuterol (PROVENTIL HFA;VENTOLIN HFA) 108 (90 Base) MCG/ACT inhaler Inhale 2 puffs into the lungs every 6 (six) hours as needed for wheezing or shortness of breath. Patient not taking: Reported on 03/04/2018 01/10/17   Reymundo Poll, MD  lisinopril (PRINIVIL,ZESTRIL) 10 MG tablet Take 1 tablet (10 mg total) by mouth daily. Patient not taking: Reported on 03/04/2018 01/11/17   Reymundo Poll, MD  Tiotropium Bromide Monohydrate (SPIRIVA RESPIMAT) 1.25 MCG/ACT AERS Inhale 2 puffs into the lungs daily. Patient not taking: Reported on 03/04/2018 01/10/17   Reymundo Poll, MD    Family History No family history on file.  Social History Social History   Tobacco Use  . Smoking status: Never Smoker  . Smokeless tobacco: Never Used  Substance Use Topics  . Alcohol use: Yes  . Drug  use: No     Allergies   Patient has no known allergies.   Review of Systems Review of Systems  Constitutional: Negative for diaphoresis.  Respiratory: Positive for shortness of breath. Negative for cough and sputum production.   Cardiovascular: Positive for chest pain.  All other systems reviewed and are negative.    Physical Exam Updated Vital Signs BP 135/83 (BP Location: Right Arm)   Pulse 83   Temp 97.7 F (36.5 C) (Oral)   Resp (!) 24   Ht 6\' 4"  (1.93 m)   Wt (!) 215.5 kg (475 lb)    SpO2 98%   BMI 57.82 kg/m   Physical Exam  Constitutional: He is oriented to person, place, and time. He appears well-developed and well-nourished. No distress.  HENT:  Head: Normocephalic and atraumatic.  Mouth/Throat: Oropharynx is clear and moist.  Neck: Normal range of motion. Neck supple.  Cardiovascular: Normal rate and regular rhythm. Exam reveals no friction rub.  No murmur heard. Pulmonary/Chest: Effort normal and breath sounds normal. No respiratory distress. He has no wheezes. He has no rales.  Abdominal: Soft. Bowel sounds are normal. He exhibits no distension. There is no tenderness.  Musculoskeletal: Normal range of motion.       Right lower leg: Normal. He exhibits edema. He exhibits no tenderness.       Left lower leg: Normal. He exhibits edema. He exhibits no tenderness.  There is 2+ pitting edema of both lower extremities.  Neurological: He is alert and oriented to person, place, and time. Coordination normal.  Skin: Skin is warm and dry. He is not diaphoretic.  Nursing note and vitals reviewed.    ED Treatments / Results  Labs (all labs ordered are listed, but only abnormal results are displayed) Labs Reviewed  BASIC METABOLIC PANEL - Abnormal; Notable for the following components:      Result Value   Glucose, Bld 127 (*)    Calcium 8.8 (*)    All other components within normal limits  CBC - Abnormal; Notable for the following components:   WBC 10.7 (*)    Hemoglobin 12.8 (*)    All other components within normal limits  TROPONIN I  BRAIN NATRIURETIC PEPTIDE  I-STAT TROPONIN, ED    EKG EKG Interpretation  Date/Time:  Wednesday March 04 2018 18:33:40 EDT Ventricular Rate:  92 PR Interval:  154 QRS Duration: 92 QT Interval:  346 QTC Calculation: 427 R Axis:   4 Text Interpretation:  Normal sinus rhythm Low voltage QRS Borderline ECG Confirmed by Geoffery Lyons ([id number]) on 03/05/2018 12:27:10 AM   Radiology Dg Chest 2 View  Result Date:  03/04/2018 CLINICAL DATA:  Chest pain for 8 hours, now resolved. EXAM: CHEST - 2 VIEW COMPARISON:  Chest radiograph January 08, 2017 FINDINGS: Cardiomediastinal silhouette is normal. No pleural effusions or focal consolidations. Trachea projects midline and there is no pneumothorax. Soft tissue planes and included osseous structures are non-suspicious. Large body habitus. IMPRESSION: Negative. Electronically Signed   By: Awilda Metro M.D.   On: 03/04/2018 19:29    Procedures Procedures (including critical care time)  Medications Ordered in ED Medications - No data to display   Initial Impression / Assessment and Plan / ED Course  I have reviewed the triage vital signs and the nursing notes.  Pertinent labs & imaging results that were available during my care of the patient were reviewed by me and considered in my medical decision making (see chart for details).  Patient with  left-sided chest wall pain.  He was recently hospitalized for respiratory failure, however is breathing comfortably this evening.  Due to his decreased mobility since his prior hospitalization, the concern of pulmonary embolism was raised.  CT scan of the chest fails to show this and troponin x2 shows no evidence of a cardiac etiology.  His pain is worse when he raises his arms above his head and I suspect this is most likely musculoskeletal.  I feel as though I have ruled out emergent pathology and believe he is appropriate for discharge.  Final Clinical Impressions(s) / ED Diagnoses   Final diagnoses:  None    ED Discharge Orders    None       Geoffery Lyonselo, Larua Collier, MD 03/05/18 [id number]

## 2018-03-25 ENCOUNTER — Ambulatory Visit: Payer: 59 | Admitting: Cardiology

## 2019-06-01 DIAGNOSIS — E559 Vitamin D deficiency, unspecified: Secondary | ICD-10-CM

## 2019-06-01 HISTORY — DX: Vitamin D deficiency, unspecified: E55.9

## 2019-06-09 ENCOUNTER — Encounter (HOSPITAL_COMMUNITY): Payer: Self-pay

## 2019-06-09 ENCOUNTER — Emergency Department (HOSPITAL_COMMUNITY): Payer: 59

## 2019-06-09 ENCOUNTER — Inpatient Hospital Stay (HOSPITAL_COMMUNITY)
Admission: EM | Admit: 2019-06-09 | Discharge: 2019-06-19 | DRG: 208 | Disposition: A | Discharge status: Home / Self Care | Payer: 59 | Attending: Internal Medicine | Admitting: Internal Medicine

## 2019-06-09 ENCOUNTER — Inpatient Hospital Stay (HOSPITAL_COMMUNITY): Payer: 59

## 2019-06-09 ENCOUNTER — Other Ambulatory Visit: Payer: Self-pay

## 2019-06-09 DIAGNOSIS — G4733 Obstructive sleep apnea (adult) (pediatric): Secondary | ICD-10-CM | POA: Diagnosis not present

## 2019-06-09 DIAGNOSIS — I2729 Other secondary pulmonary hypertension: Secondary | ICD-10-CM | POA: Diagnosis present

## 2019-06-09 DIAGNOSIS — R079 Chest pain, unspecified: Secondary | ICD-10-CM | POA: Diagnosis present

## 2019-06-09 DIAGNOSIS — E662 Morbid (severe) obesity with alveolar hypoventilation: Secondary | ICD-10-CM | POA: Diagnosis present

## 2019-06-09 DIAGNOSIS — J9601 Acute respiratory failure with hypoxia: Secondary | ICD-10-CM | POA: Diagnosis not present

## 2019-06-09 DIAGNOSIS — E876 Hypokalemia: Secondary | ICD-10-CM | POA: Diagnosis present

## 2019-06-09 DIAGNOSIS — E872 Acidosis: Secondary | ICD-10-CM | POA: Diagnosis present

## 2019-06-09 DIAGNOSIS — R0602 Shortness of breath: Secondary | ICD-10-CM | POA: Diagnosis not present

## 2019-06-09 DIAGNOSIS — R45851 Suicidal ideations: Secondary | ICD-10-CM | POA: Diagnosis present

## 2019-06-09 DIAGNOSIS — T501X5A Adverse effect of loop [high-ceiling] diuretics, initial encounter: Secondary | ICD-10-CM | POA: Diagnosis present

## 2019-06-09 DIAGNOSIS — J9602 Acute respiratory failure with hypercapnia: Secondary | ICD-10-CM | POA: Diagnosis present

## 2019-06-09 DIAGNOSIS — Z6841 Body Mass Index (BMI) 40.0 and over, adult: Secondary | ICD-10-CM | POA: Diagnosis not present

## 2019-06-09 DIAGNOSIS — Z9119 Patient's noncompliance with other medical treatment and regimen: Secondary | ICD-10-CM | POA: Diagnosis not present

## 2019-06-09 DIAGNOSIS — I952 Hypotension due to drugs: Secondary | ICD-10-CM | POA: Diagnosis present

## 2019-06-09 DIAGNOSIS — I5031 Acute diastolic (congestive) heart failure: Secondary | ICD-10-CM | POA: Diagnosis present

## 2019-06-09 DIAGNOSIS — F329 Major depressive disorder, single episode, unspecified: Secondary | ICD-10-CM | POA: Diagnosis present

## 2019-06-09 DIAGNOSIS — Z20828 Contact with and (suspected) exposure to other viral communicable diseases: Secondary | ICD-10-CM | POA: Diagnosis present

## 2019-06-09 DIAGNOSIS — R45850 Homicidal ideations: Secondary | ICD-10-CM | POA: Diagnosis present

## 2019-06-09 DIAGNOSIS — J9622 Acute and chronic respiratory failure with hypercapnia: Secondary | ICD-10-CM | POA: Diagnosis present

## 2019-06-09 DIAGNOSIS — I110 Hypertensive heart disease with heart failure: Secondary | ICD-10-CM | POA: Diagnosis present

## 2019-06-09 DIAGNOSIS — R609 Edema, unspecified: Secondary | ICD-10-CM | POA: Diagnosis not present

## 2019-06-09 DIAGNOSIS — J9621 Acute and chronic respiratory failure with hypoxia: Secondary | ICD-10-CM | POA: Diagnosis present

## 2019-06-09 DIAGNOSIS — Z79891 Long term (current) use of opiate analgesic: Secondary | ICD-10-CM | POA: Diagnosis not present

## 2019-06-09 DIAGNOSIS — J449 Chronic obstructive pulmonary disease, unspecified: Secondary | ICD-10-CM | POA: Diagnosis present

## 2019-06-09 DIAGNOSIS — I10 Essential (primary) hypertension: Secondary | ICD-10-CM | POA: Diagnosis not present

## 2019-06-09 DIAGNOSIS — I272 Pulmonary hypertension, unspecified: Secondary | ICD-10-CM | POA: Diagnosis present

## 2019-06-09 DIAGNOSIS — N179 Acute kidney failure, unspecified: Secondary | ICD-10-CM | POA: Diagnosis present

## 2019-06-09 DIAGNOSIS — Z9911 Dependence on respirator [ventilator] status: Secondary | ICD-10-CM

## 2019-06-09 DIAGNOSIS — Z79899 Other long term (current) drug therapy: Secondary | ICD-10-CM | POA: Diagnosis not present

## 2019-06-09 DIAGNOSIS — Z4659 Encounter for fitting and adjustment of other gastrointestinal appliance and device: Secondary | ICD-10-CM

## 2019-06-09 DIAGNOSIS — I5032 Chronic diastolic (congestive) heart failure: Secondary | ICD-10-CM | POA: Diagnosis not present

## 2019-06-09 HISTORY — DX: Unspecified osteoarthritis, unspecified site: M19.90

## 2019-06-09 HISTORY — DX: Essential (primary) hypertension: I10

## 2019-06-09 HISTORY — DX: Depression, unspecified: F32.A

## 2019-06-09 HISTORY — DX: Sleep apnea, unspecified: G47.30

## 2019-06-09 HISTORY — DX: Dyspnea, unspecified: R06.00

## 2019-06-09 LAB — COMPREHENSIVE METABOLIC PANEL
ALT: 15 U/L (ref 0–44)
AST: 19 U/L (ref 15–41)
Albumin: 3.3 g/dL — ABNORMAL LOW (ref 3.5–5.0)
Alkaline Phosphatase: 69 U/L (ref 38–126)
Anion gap: 8 (ref 5–15)
BUN: 10 mg/dL (ref 8–23)
CO2: 37 mmol/L — ABNORMAL HIGH (ref 22–32)
Calcium: 8.5 mg/dL — ABNORMAL LOW (ref 8.9–10.3)
Chloride: 95 mmol/L — ABNORMAL LOW (ref 98–111)
Creatinine, Ser: 0.88 mg/dL (ref 0.61–1.24)
GFR calc Af Amer: >60 mL/min (ref >60)
GFR calc non Af Amer: >60 mL/min (ref >60)
Glucose, Bld: 120 mg/dL — ABNORMAL HIGH (ref 70–99)
Potassium: 4.2 mmol/L (ref 3.5–5.1)
Sodium: 140 mmol/L (ref 135–145)
Total Bilirubin: 0.5 mg/dL (ref 0.3–1.2)
Total Protein: 8.2 g/dL — ABNORMAL HIGH (ref 6.5–8.1)

## 2019-06-09 LAB — URINALYSIS, ROUTINE W REFLEX MICROSCOPIC
Bilirubin Urine: NEGATIVE
Glucose, UA: NEGATIVE mg/dL
Hgb urine dipstick: NEGATIVE
Ketones, ur: NEGATIVE mg/dL
Leukocytes,Ua: NEGATIVE
Nitrite: NEGATIVE
Protein, ur: NEGATIVE mg/dL
Specific Gravity, Urine: 1.013 (ref 1.005–1.030)
pH: 5 (ref 5.0–8.0)

## 2019-06-09 LAB — CBC WITH DIFFERENTIAL/PLATELET
Abs Immature Granulocytes: 0.03 10*3/uL (ref 0.00–0.07)
Basophils Absolute: 0.1 10*3/uL (ref 0.0–0.1)
Basophils Relative: 1 %
Eosinophils Absolute: 0.2 10*3/uL (ref 0.0–0.5)
Eosinophils Relative: 2 %
HCT: 51.9 % (ref 39.0–52.0)
Hemoglobin: 14.8 g/dL (ref 13.0–17.0)
Immature Granulocytes: 0 %
Lymphocytes Relative: 14 %
Lymphs Abs: 1.3 10*3/uL (ref 0.7–4.0)
MCH: 27.9 pg (ref 26.0–34.0)
MCHC: 28.5 g/dL — ABNORMAL LOW (ref 30.0–36.0)
MCV: 97.9 fL (ref 80.0–100.0)
Monocytes Absolute: 1.3 10*3/uL — ABNORMAL HIGH (ref 0.1–1.0)
Monocytes Relative: 13 %
Neutro Abs: 6.6 10*3/uL (ref 1.7–7.7)
Neutrophils Relative %: 70 %
Platelets: 197 10*3/uL (ref 150–400)
RBC: 5.3 MIL/uL (ref 4.22–5.81)
RDW: 15.6 % — ABNORMAL HIGH (ref 11.5–15.5)
WBC: 9.5 10*3/uL (ref 4.0–10.5)
nRBC: 0.4 % — ABNORMAL HIGH (ref 0.0–0.2)

## 2019-06-09 LAB — BLOOD GAS, ARTERIAL
Acid-Base Excess: 10.6 mmol/L — ABNORMAL HIGH (ref 0.0–2.0)
Bicarbonate: 39.4 mmol/L — ABNORMAL HIGH (ref 20.0–28.0)
Drawn by: 54887
FIO2: 100
O2 Saturation: 89.5 %
PEEP: 8 cmH2O
Patient temperature: 98.6
Pressure control: 30 cmH2O
RATE: 18 resp/min
pCO2 arterial: 118 mmHg (ref 32.0–48.0)
pH, Arterial: 7.151 — CL (ref 7.350–7.450)
pO2, Arterial: 78.6 mmHg — ABNORMAL LOW (ref 83.0–108.0)

## 2019-06-09 LAB — POCT I-STAT 7, (LYTES, BLD GAS, ICA,H+H)
Acid-Base Excess: 9 mmol/L — ABNORMAL HIGH (ref 0.0–2.0)
Acid-Base Excess: 10 mmol/L — ABNORMAL HIGH (ref 0.0–2.0)
Bicarbonate: 40.2 mmol/L — ABNORMAL HIGH (ref 20.0–28.0)
Bicarbonate: 34.3 mmol/L — ABNORMAL HIGH (ref 20.0–28.0)
Calcium, Ion: 1.13 mmol/L — ABNORMAL LOW (ref 1.15–1.40)
Calcium, Ion: 1.06 mmol/L — ABNORMAL LOW (ref 1.15–1.40)
HCT: 48 % (ref 39.0–52.0)
HCT: 50 % (ref 39.0–52.0)
Hemoglobin: 16.3 g/dL (ref 13.0–17.0)
Hemoglobin: 17 g/dL (ref 13.0–17.0)
O2 Saturation: 78 %
O2 Saturation: 92 %
Patient temperature: 98.9
Potassium: 4.4 mmol/L (ref 3.5–5.1)
Potassium: 4.4 mmol/L (ref 3.5–5.1)
Sodium: 140 mmol/L (ref 135–145)
Sodium: 138 mmol/L (ref 135–145)
TCO2: 43 mmol/L — ABNORMAL HIGH (ref 22–32)
TCO2: 36 mmol/L — ABNORMAL HIGH (ref 22–32)
pCO2 arterial: 83.5 mmHg (ref 32.0–48.0)
pCO2 arterial: 42.5 mmHg (ref 32.0–48.0)
pH, Arterial: 7.29 — ABNORMAL LOW (ref 7.350–7.450)
pH, Arterial: 7.515 — ABNORMAL HIGH (ref 7.350–7.450)
pO2, Arterial: 50 mmHg — ABNORMAL LOW (ref 83.0–108.0)
pO2, Arterial: 57 mmHg — ABNORMAL LOW (ref 83.0–108.0)

## 2019-06-09 LAB — ETHANOL: Alcohol, Ethyl (B): <10 mg/dL (ref <10)

## 2019-06-09 LAB — CBC
HCT: 54.3 % — ABNORMAL HIGH (ref 39.0–52.0)
Hemoglobin: 15.3 g/dL (ref 13.0–17.0)
MCH: 27.6 pg (ref 26.0–34.0)
MCHC: 28.2 g/dL — ABNORMAL LOW (ref 30.0–36.0)
MCV: 97.8 fL (ref 80.0–100.0)
Platelets: 205 10*3/uL (ref 150–400)
RBC: 5.55 MIL/uL (ref 4.22–5.81)
RDW: 15.7 % — ABNORMAL HIGH (ref 11.5–15.5)
WBC: 8.8 10*3/uL (ref 4.0–10.5)
nRBC: 0.2 % (ref 0.0–0.2)

## 2019-06-09 LAB — D-DIMER, QUANTITATIVE: D-Dimer, Quant: 1.18 ug/mL-FEU — ABNORMAL HIGH (ref 0.00–0.50)

## 2019-06-09 LAB — APTT: aPTT: 28 seconds (ref 24–36)

## 2019-06-09 LAB — TRIGLYCERIDES: Triglycerides: 73 mg/dL (ref <150)

## 2019-06-09 LAB — SARS CORONAVIRUS 2 BY RT PCR (HOSPITAL ORDER, PERFORMED IN ~~LOC~~ HOSPITAL LAB): SARS Coronavirus 2: NEGATIVE

## 2019-06-09 LAB — RAPID URINE DRUG SCREEN, HOSP PERFORMED
Amphetamines: NOT DETECTED
Barbiturates: NOT DETECTED
Benzodiazepines: NOT DETECTED
Cocaine: NOT DETECTED
Opiates: NOT DETECTED
Tetrahydrocannabinol: NOT DETECTED

## 2019-06-09 LAB — TROPONIN I (HIGH SENSITIVITY)
Troponin I (High Sensitivity): 4 ng/L (ref <18)
Troponin I (High Sensitivity): 4 ng/L (ref <18)

## 2019-06-09 LAB — BRAIN NATRIURETIC PEPTIDE: B Natriuretic Peptide: 24.5 pg/mL (ref 0.0–100.0)

## 2019-06-09 LAB — ACETAMINOPHEN LEVEL: Acetaminophen (Tylenol), Serum: <10 ug/mL — ABNORMAL LOW (ref 10–30)

## 2019-06-09 LAB — PROTIME-INR
INR: 1 (ref 0.8–1.2)
Prothrombin Time: 13.5 seconds (ref 11.4–15.2)

## 2019-06-09 LAB — SALICYLATE LEVEL: Salicylate Lvl: <7 mg/dL (ref 2.8–30.0)

## 2019-06-09 MED ORDER — DOCUSATE SODIUM 50 MG/5ML PO LIQD
100.0000 mg | Freq: Two times a day (BID) | ORAL | Status: DC | PRN
  Filled 2019-06-09: qty 10

## 2019-06-09 MED ORDER — HEPARIN (PORCINE) 25000 UT/250ML-% IV SOLN: 14.0000 [IU]/kg/h | INTRAVENOUS | Status: DC

## 2019-06-09 MED ORDER — METHYLPREDNISOLONE SODIUM SUCC 125 MG IJ SOLR
80.0000 mg | INTRAMUSCULAR | Status: DC
  Administered 2019-06-09: 18:00:00 80 mg via INTRAVENOUS
  Filled 2019-06-09: qty 2

## 2019-06-09 MED ORDER — HEPARIN (PORCINE) 25000 UT/250ML-% IV SOLN
2800.0000 [IU]/h | INTRAVENOUS | Status: DC
  Administered 2019-06-09 – 2019-06-10 (×3): 2300 [IU]/h via INTRAVENOUS
  Administered 2019-06-11: 2800 [IU]/h via INTRAVENOUS
  Administered 2019-06-11: 2500 [IU]/h via INTRAVENOUS
  Administered 2019-06-11: 2300 [IU]/h via INTRAVENOUS
  Administered 2019-06-12: 2800 [IU]/h via INTRAVENOUS
  Filled 2019-06-09 (×7): qty 250

## 2019-06-09 MED ORDER — SODIUM CHLORIDE 0.9 % IV SOLN
500.0000 mg | INTRAVENOUS | Status: DC
  Administered 2019-06-09: 500 mg via INTRAVENOUS
  Filled 2019-06-09 (×2): qty 500

## 2019-06-09 MED ORDER — PROPOFOL 1000 MG/100ML IV EMUL
INTRAVENOUS | Status: AC
  Administered 2019-06-09: 5 ug/kg/min via INTRAVENOUS
  Filled 2019-06-09: qty 100

## 2019-06-09 MED ORDER — FENTANYL CITRATE (PF) 100 MCG/2ML IJ SOLN: 50.0000 ug | Freq: Once | INTRAMUSCULAR | Status: DC

## 2019-06-09 MED ORDER — FENTANYL CITRATE (PF) 100 MCG/2ML IJ SOLN
50.0000 ug | Freq: Once | INTRAMUSCULAR | Status: DC
  Filled 2019-06-09: qty 2

## 2019-06-09 MED ORDER — SODIUM CHLORIDE 0.9% FLUSH
10.0000 mL | Freq: Two times a day (BID) | INTRAVENOUS | Status: DC
  Administered 2019-06-11 – 2019-06-15 (×8): 10 mL

## 2019-06-09 MED ORDER — BISACODYL 10 MG RE SUPP: 10.0000 mg | Freq: Every day | RECTAL | Status: DC | PRN

## 2019-06-09 MED ORDER — IPRATROPIUM-ALBUTEROL 0.5-2.5 (3) MG/3ML IN SOLN: 3.0000 mL | Freq: Four times a day (QID) | RESPIRATORY_TRACT | Status: DC

## 2019-06-09 MED ORDER — ENOXAPARIN SODIUM 40 MG/0.4ML ~~LOC~~ SOLN: 40.0000 mg | SUBCUTANEOUS | Status: DC

## 2019-06-09 MED ORDER — VITAL HIGH PROTEIN PO LIQD
1000.0000 mL | ORAL | Status: DC
  Administered 2019-06-10: 01:00:00 1000 mL

## 2019-06-09 MED ORDER — ETOMIDATE 2 MG/ML IV SOLN
INTRAVENOUS | Status: AC | PRN
  Administered 2019-06-09: 25 mg via INTRAVENOUS

## 2019-06-09 MED ORDER — FAMOTIDINE 40 MG/5ML PO SUSR
20.0000 mg | Freq: Two times a day (BID) | ORAL | Status: DC
  Administered 2019-06-10 – 2019-06-12 (×6): 20 mg
  Filled 2019-06-09 (×6): qty 2.5

## 2019-06-09 MED ORDER — PROPOFOL 10 MG/ML IV BOLUS
INTRAVENOUS | Status: AC | PRN
  Administered 2019-06-09: 50 mg via INTRAVENOUS

## 2019-06-09 MED ORDER — FENTANYL CITRATE (PF) 100 MCG/2ML IJ SOLN
INTRAMUSCULAR | Status: AC | PRN
  Administered 2019-06-09: 100 ug via INTRAVENOUS

## 2019-06-09 MED ORDER — SUCCINYLCHOLINE CHLORIDE 20 MG/ML IJ SOLN
INTRAMUSCULAR | Status: AC | PRN
  Administered 2019-06-09: 125 mg via INTRAVENOUS

## 2019-06-09 MED ORDER — FENTANYL 2500MCG IN NS 250ML (10MCG/ML) PREMIX INFUSION
25.0000 ug/h | INTRAVENOUS | Status: DC
  Administered 2019-06-09 (×2): 50 ug/h via INTRAVENOUS
  Filled 2019-06-09: qty 250

## 2019-06-09 MED ORDER — FENTANYL 2500MCG IN NS 250ML (10MCG/ML) PREMIX INFUSION
50.0000 ug/h | INTRAVENOUS | Status: DC
  Administered 2019-06-10: 100 ug/h via INTRAVENOUS
  Administered 2019-06-11: 75 ug/h via INTRAVENOUS
  Filled 2019-06-09 (×2): qty 250

## 2019-06-09 MED ORDER — INFLUENZA VAC SPLIT QUAD 0.5 ML IM SUSY
0.5000 mL | PREFILLED_SYRINGE | INTRAMUSCULAR | Status: DC
  Filled 2019-06-09: qty 0.5

## 2019-06-09 MED ORDER — ALBUTEROL SULFATE (2.5 MG/3ML) 0.083% IN NEBU: 2.5000 mg | INHALATION_SOLUTION | RESPIRATORY_TRACT | Status: DC | PRN

## 2019-06-09 MED ORDER — FENTANYL CITRATE (PF) 100 MCG/2ML IJ SOLN
INTRAMUSCULAR | Status: AC
  Filled 2019-06-09: qty 4

## 2019-06-09 MED ORDER — PROPOFOL 1000 MG/100ML IV EMUL
0.0000 ug/kg/min | INTRAVENOUS | Status: DC
  Administered 2019-06-09: 22:00:00 30 ug/kg/min via INTRAVENOUS
  Administered 2019-06-09: 40 ug/kg/min via INTRAVENOUS
  Administered 2019-06-09: 15:00:00 50 ug/kg/min via INTRAVENOUS
  Administered 2019-06-09: 18:00:00 30 ug/kg/min via INTRAVENOUS
  Administered 2019-06-09: 15:00:00 5 ug/kg/min via INTRAVENOUS
  Administered 2019-06-09: 20:00:00 30 ug/kg/min via INTRAVENOUS
  Administered 2019-06-10: 20:00:00 25 ug/kg/min via INTRAVENOUS
  Administered 2019-06-10 (×2): 30 ug/kg/min via INTRAVENOUS
  Administered 2019-06-10: 14:00:00 25 ug/kg/min via INTRAVENOUS
  Administered 2019-06-10: 30 ug/kg/min via INTRAVENOUS
  Administered 2019-06-10: 12:00:00 20 ug/kg/min via INTRAVENOUS
  Administered 2019-06-10 (×3): 30 ug/kg/min via INTRAVENOUS
  Administered 2019-06-11: 25 ug/kg/min via INTRAVENOUS
  Administered 2019-06-11 (×2): 30 ug/kg/min via INTRAVENOUS
  Administered 2019-06-11: 15 ug/kg/min via INTRAVENOUS
  Filled 2019-06-09 (×3): qty 200
  Filled 2019-06-09 (×5): qty 100
  Filled 2019-06-09: qty 200
  Filled 2019-06-09 (×4): qty 100

## 2019-06-09 MED ORDER — FENTANYL BOLUS VIA INFUSION
50.0000 ug | INTRAVENOUS | Status: DC | PRN
  Filled 2019-06-09: qty 50

## 2019-06-09 MED ORDER — FUROSEMIDE 10 MG/ML IJ SOLN
80.0000 mg | Freq: Once | INTRAMUSCULAR | Status: AC
  Administered 2019-06-09: 80 mg via INTRAVENOUS
  Filled 2019-06-09: qty 8

## 2019-06-09 MED ORDER — SODIUM CHLORIDE 0.9% FLUSH: 10.0000 mL | INTRAVENOUS | Status: DC | PRN

## 2019-06-09 MED ORDER — IPRATROPIUM-ALBUTEROL 0.5-2.5 (3) MG/3ML IN SOLN
3.0000 mL | Freq: Four times a day (QID) | RESPIRATORY_TRACT | Status: DC
  Administered 2019-06-09 – 2019-06-12 (×10): 3 mL via RESPIRATORY_TRACT
  Filled 2019-06-09 (×11): qty 3

## 2019-06-09 MED ORDER — FENTANYL BOLUS VIA INFUSION
50.0000 ug | INTRAVENOUS | Status: DC | PRN
  Administered 2019-06-10 – 2019-06-11 (×3): 50 ug via INTRAVENOUS
  Filled 2019-06-09: qty 50

## 2019-06-09 MED ORDER — PROPOFOL 10 MG/ML IV BOLUS
INTRAVENOUS | Status: AC
  Administered 2019-06-09: 18:00:00 30 ug/kg/min via INTRAVENOUS
  Filled 2019-06-09: qty 20

## 2019-06-09 MED ORDER — CHLORHEXIDINE GLUCONATE CLOTH 2 % EX PADS
6.0000 | MEDICATED_PAD | Freq: Every day | CUTANEOUS | Status: DC
  Administered 2019-06-09: 6 via TOPICAL

## 2019-06-09 MED ORDER — HEPARIN BOLUS VIA INFUSION
7500.0000 [IU] | Freq: Once | INTRAVENOUS | Status: AC
  Administered 2019-06-09: 7500 [IU] via INTRAVENOUS
  Filled 2019-06-09: qty 7500

## 2019-06-09 MED ORDER — SODIUM CHLORIDE 0.9 % IV SOLN
2.0000 g | INTRAVENOUS | Status: DC
  Administered 2019-06-09: 2 g via INTRAVENOUS
  Filled 2019-06-09: qty 20
  Filled 2019-06-09: qty 2

## 2019-06-09 NOTE — ED Provider Notes (Addendum)
Medical screening examination/treatment/procedure(s) were conducted as a shared visit with non-physician practitioner(s) and myself.  I personally evaluated the patient during the encounter.  EKG Interpretation  Date/Time:  Wednesday June 09 2019 13:01:53 EDT Ventricular Rate:  94 PR Interval:    QRS Duration: 89 QT Interval:  357 QTC Calculation: 447 R Axis:   -7 Text Interpretation:  Sinus rhythm Confirmed by Lacretia Leigh ([id number]) on 06/09/2019 1:32:52 PM 63 year old male presents for shortness of breath.  Patient is talking in short phrases.  He has audible rhonchi on my exam.  Patient started on BiPAP and is improved.  Suspect element of CHF versus worsening chronic COPD.  Chest x-ray and blood work pending at this time.  2:36 PM Patient developed respiratory failure and required mechanical ventilation.  Patient given sedation with propofol and fentanyl.  Initial chest x-ray showed the tube to be very close to the carina and it was retracted.  Discussed with critical care who is requesting occult test result before they can see the patient.  Patient will require ICU admission  CRITICAL CARE Performed by: Leota Jacobsen Total critical care time: 60 minutes Critical care time was exclusive of separately billable procedures and treating other patients. Critical care was necessary to treat or prevent imminent or life-threatening deterioration. Critical care was time spent personally by me on the following activities: development of treatment plan with patient and/or surrogate as well as nursing, discussions with consultants, evaluation of patient's response to treatment, examination of patient, obtaining history from patient or surrogate, ordering and performing treatments and interventions, ordering and review of laboratory studies, ordering and review of radiographic studies, pulse oximetry and re-evaluation of patient's condition.     Lacretia Leigh, MD 06/09/19 1340    Lacretia Leigh, MD 06/09/19 [phone]

## 2019-06-09 NOTE — Progress Notes (Signed)
No changes post ABG per MD

## 2019-06-09 NOTE — ED Notes (Signed)
ED TO INPATIENT HANDOFF REPORT  ED Nurse Name and Phone #: [phone]  S Name/Age/Gender Jeffrey Skinner 63 y.o. male Room/Bed: 029C/029C  Code Status   Code Status: Full Code  Home/SNF/Other Home Patient oriented to: not alert, not oriented Is this baseline? No   Triage Complete: Triage complete  Chief Complaint Slurred speech, leg pain  Triage Note Pt arrives POV for eval of shortness of breath and blt LE swelling. Pt reports that he was doing some yardwork and his family noted that he was more SOB than usual. Pt did call EMS from the parking lot and there was some ? About stroke like symptoms. Pt reports his speech is normal, no obvious slurred speech. Pt does sound short of breath when speaking. Pt reports he is frequently SOB, but a little more than usual today. Satting well on RA.    Allergies No Known Allergies  Level of Care/Admitting Diagnosis ED Disposition    ED Disposition Condition Comment   Admit  [facility][id number]  Level of Care: ICU [6]  Covid Evaluation: Confirmed COVID Negative  Diagnosis: Acute on chronic respiratory failure with hypoxia and hypercapnia [facility](HCC) [[id number]]) [[id number]]  Admitting Physician: Lynnell CatalanAGARWALA, RAVI [[id number]]  Attending Physician: Lynnell CatalanAGARWALA, RAVI [[id number]]  Estimated length of stay: past midnight tomorrow  Certification:: I certify this patient will need inpatient services for at least 2 midnights  PT Class (Do Not Modify): Inpatient [101]  PT Acc Code (Do Not Modify): Private [1]       B Medical/Surgery History History reviewed. No pertinent past medical history. Past Surgical History:  Procedure Laterality Date  . APPENDECTOMY    . HERNIA REPAIR       A IV Location/Drains/Wounds Patient Lines/Drains/Airways Status   Active Line/Drains/Airways    Name:   Placement date:   Placement time:   Site:   Days:   Peripheral IV 06/09/19 Right Antecubital   06/09/19    1334    Antecubital   less than 1   Midline  Single Lumen 06/09/19 Midline Left Cephalic 10 cm 0 cm   06/09/19    1740    Cephalic   less than 1   Airway 7.5 mm   06/09/19    1417     less than 1          Intake/Output Last 24 hours No intake or output data in the 24 hours ending 06/09/19 1857  Labs/Imaging Results for orders placed or performed during the hospital encounter of 06/09/19 (from the past 48 hour(s))  Comprehensive metabolic panel     Status: Abnormal   Collection Time: 06/09/19 11:58 AM  Result Value Ref Range   Sodium 140 135 - 145 mmol/L   Potassium 4.2 3.5 - 5.1 mmol/L   Chloride 95 (L) 98 - 111 mmol/L   CO2 37 (H) 22 - 32 mmol/L   Glucose, Bld 120 (H) 70 - 99 mg/dL   BUN 10 8 - 23 mg/dL   Creatinine, Ser 9.560.88 0.61 - 1.24 mg/dL   Calcium 8.5 (L) 8.9 - 10.3 mg/dL   Total Protein 8.2 (H) 6.5 - 8.1 g/dL   Albumin 3.3 (L) 3.5 - 5.0 g/dL   AST 19 15 - 41 U/L   ALT 15 0 - 44 U/L   Alkaline Phosphatase 69 38 - 126 U/L   Total Bilirubin 0.5 0.3 - 1.2 mg/dL   GFR calc non Af Amer >60 >60 mL/min   GFR calc Af Amer >60 >  60 mL/min   Anion gap 8 5 - 15    Comment: Performed at [facility], [address] [place], [place] [address]  Ethanol     Status: None   Collection Time: 06/09/19 11:58 AM  Result Value Ref Range   Alcohol, Ethyl (B) <10 <10 mg/dL    Comment: (NOTE) Lowest detectable limit for serum alcohol is 10 mg/dL. For medical purposes only. Performed at [facility], [address], [place], [place] [address]   Salicylate level     Status: None   Collection Time: 06/09/19 11:58 AM  Result Value Ref Range   Salicylate Lvl <7.0 2.8 - 30.0 mg/dL    Comment: Performed at [facility], [address], [place], [place] [address]  Acetaminophen level     Status: Abnormal   Collection Time: 06/09/19 11:58 AM  Result Value Ref Range   Acetaminophen (Tylenol), Serum <10 (L) 10 - 30 ug/mL    Comment: (NOTE) Therapeutic concentrations vary significantly. A range of 10-30 ug/mL  may be  an effective concentration for many patients. However, some  are best treated at concentrations outside of this range. Acetaminophen concentrations >150 ug/mL at 4 hours after ingestion  and >50 ug/mL at 12 hours after ingestion are often associated with  toxic reactions. Performed at [facility], [address] [place], [place] [address]   cbc     Status: Abnormal   Collection Time: 06/09/19 11:58 AM  Result Value Ref Range   WBC 8.8 4.0 - 10.5 K/uL   RBC 5.55 4.22 - 5.81 MIL/uL   Hemoglobin 15.3 13.0 - 17.0 g/dL   HCT 19.6 (H) 22.2 - 97.9 %   MCV 97.8 80.0 - 100.0 fL   MCH 27.6 26.0 - 34.0 pg   MCHC 28.2 (L) 30.0 - 36.0 g/dL   RDW 89.2 (H) 11.9 - 41.7 %   Platelets 205 150 - 400 K/uL   nRBC 0.2 0.0 - 0.2 %    Comment: Performed at [facility], [address] [place], [place] [address]  Protime-INR     Status: None   Collection Time: 06/09/19  1:11 PM  Result Value Ref Range   Prothrombin Time 13.5 11.4 - 15.2 seconds   INR 1.0 0.8 - 1.2    Comment: (NOTE) INR goal varies based on device and disease states. Performed at [facility], [address] [place], [place] [address]   APTT     Status: None   Collection Time: 06/09/19  1:11 PM  Result Value Ref Range   aPTT 28 24 - 36 seconds    Comment: Performed at [facility], [address], [place], [place] [address]  Brain natriuretic peptide     Status: None   Collection Time: 06/09/19  1:26 PM  Result Value Ref Range   B Natriuretic Peptide 24.5 0.0 - 100.0 pg/mL    Comment: Performed at [facility], [address] [place], [place] [address]  Troponin I (High Sensitivity)     Status: None   Collection Time: 06/09/19  1:26 PM  Result Value Ref Range   Troponin I (High Sensitivity) 4 <18 ng/L    Comment: (NOTE) Elevated high sensitivity troponin I (hsTnI) values and significant  changes across serial measurements may suggest ACS but many other  chronic and acute conditions are known  to elevate hsTnI results.  Refer to the "Links" section for chest  pain algorithms and additional  guidance. Performed at [facility], [place] [address], [place], [place] [address]   Rapid urine drug screen (hospital performed)     Status: None   Collection Time: 06/09/19  1:35 PM  Result Value Ref Range   Opiates NONE DETECTED NONE DETECTED   Cocaine NONE DETECTED NONE DETECTED   Benzodiazepines NONE DETECTED NONE DETECTED   Amphetamines NONE DETECTED NONE DETECTED   Tetrahydrocannabinol NONE DETECTED NONE DETECTED   Barbiturates NONE DETECTED NONE DETECTED    Comment: (NOTE) DRUG SCREEN FOR MEDICAL PURPOSES ONLY.  IF CONFIRMATION IS NEEDED FOR ANY PURPOSE, NOTIFY LAB WITHIN 5 DAYS. LOWEST DETECTABLE LIMITS FOR URINE DRUG SCREEN Drug Class                     Cutoff (ng/mL) Amphetamine and metabolites    1000 Barbiturate and metabolites    200 Benzodiazepine                 128 Tricyclics and metabolites     300 Opiates and metabolites        300 Cocaine and metabolites        300 THC                            50 Performed at [facility], [place] [address], [place], [place] [address]   Urinalysis, Routine w reflex microscopic     Status: None   Collection Time: 06/09/19  1:40 PM  Result Value Ref Range   Color, Urine YELLOW YELLOW   APPearance CLEAR CLEAR   Specific Gravity, Urine 1.013 1.005 - 1.030   pH 5.0 5.0 - 8.0   Glucose, UA NEGATIVE NEGATIVE mg/dL   Hgb urine dipstick NEGATIVE NEGATIVE   Bilirubin Urine NEGATIVE NEGATIVE   Ketones, ur NEGATIVE NEGATIVE mg/dL   Protein, ur NEGATIVE NEGATIVE mg/dL   Nitrite NEGATIVE NEGATIVE   Leukocytes,Ua NEGATIVE NEGATIVE    Comment: Performed at [facility] [address] [place], [place] [address]  CBC with Differential/Platelet     Status: Abnormal   Collection Time: 06/09/19  1:40 PM  Result Value Ref Range   WBC 9.5 4.0 - 10.5 K/uL   RBC 5.30 4.22 - 5.81 MIL/uL   Hemoglobin 14.8 13.0 - 17.0 g/dL    HCT 51.9 39.0 - 52.0 %   MCV 97.9 80.0 - 100.0 fL   MCH 27.9 26.0 - 34.0 pg   MCHC 28.5 (L) 30.0 - 36.0 g/dL   RDW 15.6 (H) 11.5 - 15.5 %   Platelets 197 150 - 400 K/uL   nRBC 0.4 (H) 0.0 - 0.2 %   Neutrophils Relative % 70 %   Neutro Abs 6.6 1.7 - 7.7 K/uL   Lymphocytes Relative 14 %   Lymphs Abs 1.3 0.7 - 4.0 K/uL   Monocytes Relative 13 %   Monocytes Absolute 1.3 (H) 0.1 - 1.0 K/uL   Eosinophils Relative 2 %   Eosinophils Absolute 0.2 0.0 - 0.5 K/uL   Basophils Relative 1 %   Basophils Absolute 0.1 0.0 - 0.1 K/uL   Immature Granulocytes 0 %   Abs Immature Granulocytes 0.03 0.00 - 0.07 K/uL    Comment: Performed at [facility], [place] [address], [place], [place] [address]  SARS Coronavirus 2 [facility] order, Performed in [facility] hospital lab) Nasopharyngeal Nasopharyngeal Swab     Status: None  Collection Time: 06/09/19  2:40 PM   Specimen: Nasopharyngeal Swab  Result Value Ref Range   SARS Coronavirus 2 NEGATIVE NEGATIVE    Comment: (NOTE) If result is NEGATIVE SARS-CoV-2 target nucleic acids are NOT DETECTED. The SARS-CoV-2 RNA is generally detectable in upper and lower  respiratory specimens during the acute phase of infection. The lowest  concentration of SARS-CoV-2 viral copies this assay can detect is 250  copies / mL. A negative result does not preclude SARS-CoV-2 infection  and should not be used as the sole basis for treatment or other  patient management decisions.  A negative result may occur with  improper specimen collection / handling, submission of specimen other  than nasopharyngeal swab, presence of viral mutation(s) within the  areas targeted by this assay, and inadequate number of viral copies  (<250 copies / mL). A negative result must be combined with clinical  observations, patient history, and epidemiological information. If result is POSITIVE SARS-CoV-2 target nucleic acids are DETECTED. The SARS-CoV-2 RNA is generally detectable in upper  and lower  respiratory specimens dur ing the acute phase of infection.  Positive  results are indicative of active infection with SARS-CoV-2.  Clinical  correlation with patient history and other diagnostic information is  necessary to determine patient infection status.  Positive results do  not rule out bacterial infection or co-infection with other viruses. If result is PRESUMPTIVE POSTIVE SARS-CoV-2 nucleic acids MAY BE PRESENT.   A presumptive positive result was obtained on the submitted specimen  and confirmed on repeat testing.  While 2019 novel coronavirus  (SARS-CoV-2) nucleic acids may be present in the submitted sample  additional confirmatory testing may be necessary for epidemiological  and / or clinical management purposes  to differentiate between  SARS-CoV-2 and other Sarbecovirus currently known to infect humans.  If clinically indicated additional testing with an alternate test  methodology [phone]) is advised. The SARS-CoV-2 RNA is generally  detectable in upper and lower respiratory sp ecimens during the acute  phase of infection. The expected result is Negative. Fact Sheet for Patients:  BoilerBrush.com.cy Fact Sheet for Healthcare Providers: https://pope.com/ This test is not yet approved or cleared by the [place] FDA and has been authorized for detection and/or diagnosis of SARS-CoV-2 by FDA under an Emergency Use Authorization (EUA).  This EUA will remain in effect (meaning this test can be used) for the duration of the COVID-19 declaration under Section 564(b)(1) of the Act, 21 U.S.C. section 360bbb-3(b)(1), unless the authorization is terminated or revoked sooner. Performed at [facility], [address] [place], [place] [address]   Troponin I (High Sensitivity)     Status: None   Collection Time: 06/09/19  3:34 PM  Result Value Ref Range   Troponin I (High Sensitivity) 4 <18 ng/L     Comment: (NOTE) Elevated high sensitivity troponin I (hsTnI) values and significant  changes across serial measurements may suggest ACS but many other  chronic and acute conditions are known to elevate hsTnI results.  Refer to the "Links" section for chest pain algorithms and additional  guidance. Performed at [facility], [address], [place], [place] [address]   Triglycerides     Status: None   Collection Time: 06/09/19  3:34 PM  Result Value Ref Range   Triglycerides 73 <150 mg/dL    Comment: Performed at [facility], [address] [place], [place] [address]  Blood gas, arterial     Status: Abnormal  Collection Time: 06/09/19  3:40 PM  Result Value Ref Range   FIO2 100.00    Delivery systems VENTILATOR    Mode PCV    LHR 18 resp/min   Peep/cpap 8.0 cm H20   Pressure control 30 cm H20   pH, Arterial 7.151 (LL) 7.350 - 7.450    Comment: CRITICAL RESULT CALLED TO, READ BACK BY AND VERIFIED WITH: NATADLIE BRENCHLEY RRT,RCP AT 1547 ON 06/09/2019    pCO2 arterial 118 (HH) 32.0 - 48.0 mmHg    Comment: CRITICAL RESULT CALLED TO, READ BACK BY AND VERIFIED WITH: NATALIE BRENCHLEY RRT,RCP AT 1547 ON 06/09/2019    pO2, Arterial 78.6 (L) 83.0 - 108.0 mmHg   Bicarbonate 39.4 (H) 20.0 - 28.0 mmol/L   Acid-Base Excess 10.6 (H) 0.0 - 2.0 mmol/L   O2 Saturation 89.5 %   Patient temperature 98.6    Collection site LEFT RADIAL    Drawn by [id number]    Sample type ARTERIAL DRAW    Allens test (pass/fail) PASS PASS  I-STAT 7, (LYTES, BLD GAS, ICA, H+H)     Status: Abnormal   Collection Time: 06/09/19  5:49 PM  Result Value Ref Range   pH, Arterial 7.290 (L) 7.350 - 7.450   pCO2 arterial 83.5 (HH) 32.0 - 48.0 mmHg   pO2, Arterial 50.0 (L) 83.0 - 108.0 mmHg   Bicarbonate 40.2 (H) 20.0 - 28.0 mmol/L   TCO2 43 (H) 22 - 32 mmol/L   O2 Saturation 78.0 %   Acid-Base Excess 9.0 (H) 0.0 - 2.0 mmol/L   Sodium 140 135 - 145 mmol/L   Potassium 4.4 3.5 - 5.1 mmol/L   Calcium, Ion  1.13 (L) 1.15 - 1.40 mmol/L   HCT 48.0 39.0 - 52.0 %   Hemoglobin 16.3 13.0 - 17.0 g/dL   Patient temperature HIDE    Collection site RADIAL, ALLEN'S TEST ACCEPTABLE    Drawn by RT    Sample type ARTERIAL    Comment NOTIFIED PHYSICIAN    Dg Chest Port 1 View  Result Date: 06/09/2019 CLINICAL DATA:  Check endotracheal tube placement EXAM: PORTABLE CHEST 1 VIEW COMPARISON:  06/09/2019 FINDINGS: Endotracheal tube is noted in satisfactory position. Cardiac shadow is mildly prominent but stable. Central vascular congestion is again identified with increasing opacities bilaterally likely related to edema. No sizable effusion is seen. No bony abnormality is noted. IMPRESSION: Increasing vascular congestion and parenchymal opacities likely related to worsening edema. Electronically Signed   By: Alcide CleverMark  Lukens M.D.   On: 06/09/2019 16:04   Dg Chest Port 1 View  Result Date: 06/09/2019 CLINICAL DATA:  Shortness of breath EXAM: PORTABLE CHEST 1 VIEW COMPARISON:  03/04/2018 FINDINGS: Endotracheal tube terminates approximately 0.7 cm above the carina. Heart is enlarged, unchanged. Lung volumes low. There is pulmonary vascular congestion and interstitial opacities bilaterally. No large pleural fluid collection. No pneumothorax. IMPRESSION: 1. Endotracheal tube terminates just above the level of the carina. Recommend retraction approximately 3 cm. 2. Findings suggestive of congestive heart failure with interstitial edema. Electronically Signed   By: Duanne GuessNicholas  Plundo M.D.   On: 06/09/2019 14:43    Pending Labs Unresulted Labs (From admission, onward)    Start     Ordered   06/16/19 0500  Creatinine, serum  (enoxaparin (LOVENOX)    CrCl >/= 30 ml/min)  Weekly,   R    Comments: while on enoxaparin therapy    06/09/19 1818   06/10/19 0500  CBC  Tomorrow morning,   R  06/09/19 1818   06/10/19 0500  Basic metabolic panel  Tomorrow morning,   R     06/09/19 1818   06/10/19 0500  Blood gas, arterial  Tomorrow  morning,   R     06/09/19 1818   06/10/19 0500  Triglycerides  (propofol (DIPRIVAN))  Daily,   R    Comments: While on propofol (DIPRIVAN)    06/09/19 1829   06/09/19 1804  HIV antibody (Routine Testing)  Once,   STAT     06/09/19 1818   06/09/19 1804  CBC  (enoxaparin (LOVENOX)    CrCl >/= 30 ml/min)  Once,   STAT    Comments: Baseline for enoxaparin therapy IF NOT ALREADY DRAWN.  Notify MD if PLT < 100 K.    06/09/19 1818   06/09/19 1804  Creatinine, serum  (enoxaparin (LOVENOX)    CrCl >/= 30 ml/min)  Once,   STAT    Comments: Baseline for enoxaparin therapy IF NOT ALREADY DRAWN.    06/09/19 1818   06/09/19 1800  Blood gas, arterial  Once,   R     06/09/19 1600   06/09/19 1500  Blood gas, arterial  Once,   R     06/09/19 1546          Vitals/Pain Today's Vitals   06/09/19 1635 06/09/19 1700 06/09/19 1715 06/09/19 1730  BP: 121/73 120/82 (!) 125/93 121/90  Pulse:      Resp: (!) 25 (!) 24 (!) 28 (!) 25  SpO2:  91%    Weight:      Height:      PainSc:        Isolation Precautions No active isolations  Medications Medications  propofol (DIPRIVAN) 1000 MG/100ML infusion (30 mcg/kg/min  226.8 kg Intravenous New Bag/Given 06/09/19 1804)  methylPREDNISolone sodium succinate (SOLU-MEDROL) 125 mg/2 mL injection 80 mg (80 mg Intravenous Given 06/09/19 1802)  ipratropium-albuterol (DUONEB) 0.5-2.5 (3) MG/3ML nebulizer solution 3 mL (3 mLs Nebulization Not Given 06/09/19 1653)  albuterol (PROVENTIL) (2.5 MG/3ML) 0.083% nebulizer solution 2.5 mg (has no administration in time range)  sodium chloride flush (NS) 0.9 % injection 10-40 mL (has no administration in time range)  sodium chloride flush (NS) 0.9 % injection 10-40 mL (has no administration in time range)  enoxaparin (LOVENOX) injection 40 mg (has no administration in time range)  famotidine (PEPCID) 40 MG/5ML suspension 20 mg (has no administration in time range)  ipratropium-albuterol (DUONEB) 0.5-2.5 (3) MG/3ML nebulizer  solution 3 mL (has no administration in time range)  feeding supplement (VITAL HIGH PROTEIN) liquid 1,000 mL (has no administration in time range)  docusate (COLACE) 50 MG/5ML liquid 100 mg (has no administration in time range)  bisacodyl (DULCOLAX) suppository 10 mg (has no administration in time range)  fentaNYL in NS (9mcg/ml) infusion-PREMIX (has no administration in time range)  fentaNYL (SUBLIMAZE) bolus via infusion 50 mcg (has no administration in time range)  fentaNYL (SUBLIMAZE) injection 50 mcg (has no administration in time range)  etomidate (AMIDATE) injection (25 mg Intravenous Given 06/09/19 1415)  succinylcholine (ANECTINE) injection (125 mg Intravenous Given 06/09/19 1415)  propofol (DIPRIVAN) 10 mg/mL bolus/IV push (50 mg Intravenous Given 06/09/19 1430)  fentaNYL (SUBLIMAZE) injection (100 mcg Intravenous Given 06/09/19 1430)  furosemide (LASIX) injection 80 mg (80 mg Intravenous Given 06/09/19 1801)    Mobility non-ambulatory Moderate fall risk   Focused Assessments Pulmonary Assessment Handoff:  Lung sounds: Bilateral Breath Sounds: Diminished O2 Device: Ventilator, Bi-PAP  R Recommendations: See Admitting Provider Note  Report given to:   Additional Notes:

## 2019-06-09 NOTE — Progress Notes (Signed)
Dear Doctor: This patient has been identified as a candidate for PICC for the following reason (s): IV therapy over 48 hours and poor veins/poor circulatory system (CHF, COPD, emphysema, diabetes, steroid use, IV drug abuse, etc.) If you agree, please write an order for the indicated device. For any questions contact the Vascular Access Team at [phone] if no answer, please leave a message.  Thank you for supporting the early vascular access assessment program. 

## 2019-06-09 NOTE — ED Triage Notes (Signed)
Pt arrives POV for eval of shortness of breath and blt LE swelling. Pt reports that he was doing some yardwork and his family noted that he was more SOB than usual. Pt did call EMS from the parking lot and there was some ? About stroke like symptoms. Pt reports his speech is normal, no obvious slurred speech. Pt does sound short of breath when speaking. Pt reports he is frequently SOB, but a little more than usual today. Satting well on RA.

## 2019-06-09 NOTE — Progress Notes (Signed)
Sputum trap left in place, RN aware.

## 2019-06-09 NOTE — ED Provider Notes (Signed)
[facility] EMERGENCY DEPARTMENT Provider Note   CSN: [id number] Arrival date & time: 06/09/19  1144     History   Chief Complaint Chief Complaint  Patient presents with  . Shortness of Breath   LEVEL 5 CAVEAT - RESPIRATORY FAILURE  HPI Jeffrey Skinner is a 63 y.o. male with PMHx respiratory failure with hypoxia who presents to the ED today due to shortness of breath. Pt is denying shortness of breath currently. He states his family told him to come to the ED because he sounded "funny" when they spoke with him on the phone. Per chart review patient was admitted in 2018 for hypoxia; discussed case with wife on the phone and she reports sometime in 2019 he had to be admitted at the [place] in [place], [place] for similar issues. Pt states that he is supposed to wear oxygen at home but doesn't.      The history is provided by the patient, medical records and the spouse.    History reviewed. No pertinent past medical history.  Patient Active Problem List   Diagnosis Date Noted  . Respiratory failure with hypoxia (HCC) 01/09/2017  . SOB (shortness of breath)   . Hypoxia     Past Surgical History:  Procedure Laterality Date  . APPENDECTOMY    . HERNIA REPAIR          Home Medications    Prior to Admission medications   Medication Sig Start Date End Date Taking? Authorizing Provider  amLODipine (NORVASC) 5 MG tablet Take 5 mg by mouth daily. 12/14/17  Yes [provider]  furosemide (LASIX) 80 MG tablet Take 80 mg by mouth daily. 12/14/17  Yes [provider]  ipratropium-albuterol (DUONEB) 0.5-2.5 (3) MG/3ML SOLN Take 3 mLs by nebulization every 6 (six) hours as needed for wheezing. 12/14/17  Yes [provider]  naproxen sodium (ALEVE) 220 MG tablet Take 440 mg by mouth daily as needed (pain).   Yes [provider]  albuterol (PROVENTIL HFA;VENTOLIN HFA) 108 (90 Base) MCG/ACT inhaler Inhale 2 puffs into the lungs every 6 (six) hours as  needed for wheezing or shortness of breath. Patient not taking: Reported on 03/04/2018 01/10/17   Reymundo Poll, MD  HYDROcodone-acetaminophen (NORCO) 5-325 MG tablet Take 1-2 tablets by mouth every 6 (six) hours as needed. Patient not taking: Reported on 06/09/2019 03/05/18   Geoffery Lyons, MD  lisinopril (PRINIVIL,ZESTRIL) 10 MG tablet Take 1 tablet (10 mg total) by mouth daily. Patient not taking: Reported on 03/04/2018 01/11/17   Reymundo Poll, MD  Tiotropium Bromide Monohydrate (SPIRIVA RESPIMAT) 1.25 MCG/ACT AERS Inhale 2 puffs into the lungs daily. Patient not taking: Reported on 03/04/2018 01/10/17   Reymundo Poll, MD    Family History History reviewed. No pertinent family history.  Social History Social History   Tobacco Use  . Smoking status: Never Smoker  . Smokeless tobacco: Never Used  Substance Use Topics  . Alcohol use: Yes  . Drug use: No     Allergies   Patient has no known allergies.   Review of Systems Review of Systems  Unable to perform ROS: Severe respiratory distress  Respiratory: Positive for shortness of breath.      Physical Exam Updated Vital Signs BP 131/77   Pulse 83   Resp 10   Ht 6\' 5"  (1.956 m)   Wt (!) 226.8 kg   SpO2 96%   BMI 59.29 kg/m   Physical Exam Vitals signs and nursing note reviewed.  Constitutional:      Appearance: He is obese.     Comments: Morbidly obese  HENT:     Head: Normocephalic and atraumatic.  Eyes:     Conjunctiva/sclera: Conjunctivae normal.  Neck:     Musculoskeletal: Neck supple.  Cardiovascular:     Rate and Rhythm: Normal rate and regular rhythm.  Pulmonary:     Effort: Tachypnea present.     Comments: Pt on non-rebreather initially; unable to speak in full sentences and tachypneic.  Abdominal:     Palpations: Abdomen is soft.     Tenderness: There is no abdominal tenderness.  Musculoskeletal:     Right lower leg: Edema present.     Left lower leg: Edema present.     Comments: 2+ pitting  edema bilaterally  Skin:    General: Skin is warm and dry.  Neurological:     Mental Status: He is alert.     Comments: CN 3-12 grossly intact A&O x4 although patient appears confused at times and has nonsensical speech  GCS 15 Sensation and strength intact Coordination with finger-to-nose WNL Neg pronator drift       ED Treatments / Results  Labs (all labs ordered are listed, but only abnormal results are displayed) Labs Reviewed  COMPREHENSIVE METABOLIC PANEL - Abnormal; Notable for the following components:      Result Value   Chloride 95 (*)    CO2 37 (*)    Glucose, Bld 120 (*)    Calcium 8.5 (*)    Total Protein 8.2 (*)    Albumin 3.3 (*)    All other components within normal limits  ACETAMINOPHEN LEVEL - Abnormal; Notable for the following components:   Acetaminophen (Tylenol), Serum <10 (*)    All other components within normal limits  CBC - Abnormal; Notable for the following components:   HCT 54.3 (*)    MCHC 28.2 (*)    RDW 15.7 (*)    All other components within normal limits  CBC WITH DIFFERENTIAL/PLATELET - Abnormal; Notable for the following components:   MCHC 28.5 (*)    RDW 15.6 (*)    nRBC 0.4 (*)    Monocytes Absolute 1.3 (*)    All other components within normal limits  SARS CORONAVIRUS 2 (HOSPITAL ORDER, PERFORMED IN [facility])  ETHANOL  SALICYLATE LEVEL  RAPID URINE DRUG SCREEN, HOSP PERFORMED  PROTIME-INR  APTT  URINALYSIS, ROUTINE W REFLEX MICROSCOPIC  BRAIN NATRIURETIC PEPTIDE  BLOOD GAS, ARTERIAL  TRIGLYCERIDES  TROPONIN I (HIGH SENSITIVITY)  TROPONIN I (HIGH SENSITIVITY)    EKG EKG Interpretation  Date/Time:  Wednesday June 09 2019 13:01:53 EDT Ventricular Rate:  94 PR Interval:    QRS Duration: 89 QT Interval:  357 QTC Calculation: 447 R Axis:   -7 Text Interpretation:  Sinus rhythm Confirmed by Lorre NickAllen, Anthony ([id number]) on 06/09/2019 1:39:27 PM   Radiology Dg Chest Port 1 View  Result Date: 06/09/2019  CLINICAL DATA:  Shortness of breath EXAM: PORTABLE CHEST 1 VIEW COMPARISON:  03/04/2018 FINDINGS: Endotracheal tube terminates approximately 0.7 cm above the carina. Heart is enlarged, unchanged. Lung volumes low. There is pulmonary vascular congestion and interstitial opacities bilaterally. No large pleural fluid collection. No pneumothorax. IMPRESSION: 1. Endotracheal tube terminates just above the level of the carina. Recommend retraction approximately 3 cm. 2. Findings suggestive of congestive heart failure with interstitial edema. Electronically Signed   By: Duanne GuessNicholas  Plundo M.D.   On: 06/09/2019 14:43    Procedures .Critical Care  Performed by: Eustaquio Maize, PA-C Authorized by: Eustaquio Maize, PA-C   Critical care provider statement:    Critical care time (minutes):  45   Critical care was necessary to treat or prevent imminent or life-threatening deterioration of the following conditions:  Respiratory failure   Critical care was time spent personally by me on the following activities:  Discussions with consultants, evaluation of patient's response to treatment, examination of patient, ordering and performing treatments and interventions, ordering and review of laboratory studies, ordering and review of radiographic studies, pulse oximetry, re-evaluation of patient's condition, obtaining history from patient or surrogate and review of old charts   (including critical care time)  Medications Ordered in ED Medications  etomidate (AMIDATE) injection (25 mg Intravenous Given 06/09/19 1415)  succinylcholine (ANECTINE) injection (125 mg Intravenous Given 06/09/19 1415)  propofol (DIPRIVAN) 10 mg/mL bolus/IV push (has no administration in time range)  fentaNYL (SUBLIMAZE) 100 MCG/2ML injection (has no administration in time range)  fentaNYL (SUBLIMAZE) injection 50 mcg (has no administration in time range)  fentaNYL 2585mcg in NS 236mL (39mcg/ml) infusion-PREMIX (100 mcg/hr Intravenous Rate/Dose  Change 06/09/19 1523)  fentaNYL (SUBLIMAZE) bolus via infusion 50 mcg (has no administration in time range)  propofol (DIPRIVAN) 1000 MG/100ML infusion (10 mcg/kg/min  226.8 kg Intravenous Rate/Dose Change 06/09/19 1513)  propofol (DIPRIVAN) 10 mg/mL bolus/IV push (50 mg Intravenous Given 06/09/19 1430)  fentaNYL (SUBLIMAZE) injection (100 mcg Intravenous Given 06/09/19 1430)  furosemide (LASIX) injection 80 mg (has no administration in time range)     Initial Impression / Assessment and Plan / ED Course  I have reviewed the triage vital signs and the nursing notes.  Pertinent labs & imaging results that were available during my care of the patient were reviewed by me and considered in my medical decision making (see chart for details).    63 year old male who presents with complaints of shortness of breath.  He states that his family told to come to the ED because he sounded funny.  Patient drove self to the ED without issue.  Apparently whenever he had difficulty getting out of the car and needed help with transferring.  Patient initially placed on nonrebreather when I evaluated him.  He is tachypneic and unable to speak in full sentences.  Switched to BiPAP.  Patient does have history of respiratory failure with hypoxia.  He states that he is supposed to wear oxygen at home but he does not.  Does appear slightly altered but suspect this is due to her hypoxia.  Obtain portable x-ray, baseline blood work including ABG, EKG.  Patient will need to be admitted.    While ordering labs nursing staff informed myself and attending physician Dr. Zenia Resides that pt's GCS decreased to 6. Able to arouse with painful stimuli but patient unable to follow commands. Dr. Zenia Resides intubated patient without difficulty and post intubation xray obtained which showed ET tube just above the carina; will retract 2 cm.   Xray shows CHF; will order 80 mg IV lasix. Per pt's home meds he is supposed to be taking 80 mg daily. Awaiting  covid test prior to crtical care evaluating patient.   Have updated wife on plan.   Awaiting critical care to admit. Case signed out to Dr. Darl Householder at shift change who was made aware of patient by attending physician Dr. Zenia Resides.        Final Clinical Impressions(s) / ED Diagnoses   Final diagnoses:  Acute respiratory failure with hypoxia and hypercapnia ([place])  ED Discharge Orders    None       Tanda RockersVenter, Cortne Amara, PA-C 06/09/19 1557    Lorre NickAllen, Anthony, MD 06/10/19 [phone]

## 2019-06-09 NOTE — Progress Notes (Signed)
Critical care attending  Consultation requested for admission received.  Patient will be evaluated once COVID-19 status is now as this will affect ultimate disposition.

## 2019-06-09 NOTE — Code Documentation (Signed)
Time out performed

## 2019-06-09 NOTE — Progress Notes (Signed)
ANTICOAGULATION CONSULT NOTE - Initial Consult  Pharmacy Consult for Heparin Indication: pulmonary embolus  No Known Allergies  Patient Measurements: Height: 6\' 5"  (195.6 cm) Weight: (!) 500 lb (226.8 kg) IBW/kg (Calculated) : 89.1 Heparin Dosing Weight: 146 kg  Vital Signs: BP: 121/90 (09/09 1730) Pulse Rate: 74 (09/09 1934)  Labs: Recent Labs    06/09/19 1158 06/09/19 1311 06/09/19 1326 06/09/19 1340 06/09/19 1534 06/09/19 1749  HGB 15.3  --   --  14.8  --  16.3  HCT 54.3*  --   --  51.9  --  48.0  PLT 205  --   --  197  --   --   APTT  --  28  --   --   --   --   LABPROT  --  13.5  --   --   --   --   INR  --  1.0  --   --   --   --   CREATININE 0.88  --   --   --   --   --   TROPONINIHS  --   --  4  --  4  --     Estimated Creatinine Clearance: 179.8 mL/min (by C-G formula based on SCr of 0.88 mg/dL).   Assessment: 63 yr old morbidly obese male admitted with acute shortness of breath requiring prompt intubation, with progressive respiratory failure of unclear cause; pharmacy consulted to dose/monitor heparin for possible PE.  Other medical history includes: HTN, OSA, chronic resp failure, heart failure, COPD  Goal of Therapy:  Heparin level 0.3-0.7 units/ml Monitor platelets by anticoagulation protocol: Yes   Plan:  Heparin 7500 units IV bolus, followed by heparin infusion at 2300 units/hr Check 6-hr heparin level, then daily Monitor CBC daily Monitor pt for signs/symptoms of bleedin   Gillermina Hu, PharmD, BCPS, [facility] Clinical Pharmacist 06/09/2019,8:42 PM

## 2019-06-09 NOTE — H&P (Addendum)
Jeffrey Skinner, MRN:  [id number], DOB:  1955/12/18, LOS: 0 ADMISSION DATE:  06/09/2019, CONSULTATION DATE:  06/09/2019 REFERRING MD:  ED provider, CHIEF COMPLAINT:  Shortness of breath  Brief History   63 year old male with past medical history of morbid obesity, HTN, OSA/OHS and chronic respiratory failure presented to [facility] ED with shortness of breath and bilateral lower extremity swelling.   In ED arrival, he was placed on nonrebreather mask, and required BiPAP and eventually required intubation in ED due to acute on chronic respiratory failure with hypoxia and hyper apnea and admitted in ICU for mechanical ventilation and further management.  History of present illness   63 year old male with past medical history of morbid obesity, heart failure, COPD, chronic respiratory failure, came to the emergency department today with shortness of breath more than his baseline and also because his family noticed he sounds funny today and recommended him to go to the ED.  He has chronic respiratory failure with hypoxia and per his wife he uses BiPAP at night. He drove to emergency department by his own but when he got here, he had further difficulty and had hard time getting out of the car.  On his arrival, he found to be hypoxic and tachypnic and unable to talk full sentences and placed on BiPAP.  He then got altered and developed respiratory distress with hypoxia and his GCS decreased to 6 and got intubated.  Fist ABG: with respiratory acidosis with PH 7.151 PCO2 118, HCO3 39.4.  and His post intubation CXR with congestion (CXR not obtained at full inspiratory though) so he received IV Lasix for for volume overload and probable CHF. PCCM consulted for admission for acute on chronic respiratory failure with hypoxia and hypercapea.  Past Medical History   Morbid obesity, OSA/OHS, chronic respiratory failure,  Significant Hospital Events   9/9 Acute respiratory failure   Consults:     Procedures:   9/9 ET intubation (At ED)  Significant Diagnostic Tests:  Labs on arrival 9/9: 9/9 CXR: Diffuse vascular congestion, and parenchymal opacities suggestive of pulmonary edema. First ABG 9/9 PH 7.151 PCO2 118, HCO3 39.4 PH 7.29, PCO2: 83 and HCO3 40.2 Second ABG 9/9: pCO2 83; pH 7.29;  HCO3 40.2, %O2 Sat High sensitive Trop 4 BNP 24.5 UDS negative Micro Data:  SARS COVID-9 negative  Antimicrobials:  None  Interim history/subjective:  Patient is sedated and on mechanical ventilation  Objective   Blood pressure 131/77, pulse 83, resp. rate 10, height 6\' 5"  (1.956 m), weight (!) 226.8 kg, SpO2 96 %.    Vent Mode: PCV FiO2 (%):  [100 %] 100 % Set Rate:  [16 bmp-18 bmp] 18 bmp Vt Set:  [620 mL] 620 mL PEEP:  [5 cmH20-8 cmH20] 8 cmH20 Plateau Pressure:  [30 cmH20] 30 cmH20  No intake or output data in the 24 hours ending 06/09/19 1540 Filed Weights   06/09/19 1154  Weight: (!) 226.8 kg    Examination: General: Sedated, in no acute distress HENT: Intubated Lungs: On mechanical ventilation, decreased breath sound L>R, no wheezing Cardiovascular: Distant S1S2, bilateral lower extremity edema, not able to assess JVP due to obesity and being intubated Abdomen: Soft and nontender to palpation. BS present Extremities: Chronic statis skin changes, bilateral lower extremity edema Neuro: Sedated   Assessment & Plan:  Acute on chronic respiratory failure with hypoxia and hypercapnia: OSA?OHS CHF? S/p ET intubation at ED and on mechanical ventilation with pressure control.  Likely in setting of obesity  hypoventilation syndrome and volume overload. Patient has Hx of chronic respiratory failure and prior hospitalization at 2018 for acute respiratory failure. Per his wife, his PCO2 level has been up to 120 on prior admissions.  Patient is on 80 mg PO Lasix at home to help with his LEE. How ever, his last TTE 12/2016 with EF 60-65%, and no DD reported and no documented CHF.   Patient has some volume overload on exam but his BNP is low normal. The congestion on chest x-ray might be exaggerated due to hypoinflation on CXR.  And I think his sever respiratory distress is mainly in setting of his OHS rather than sever new CHF. How ever, will assess his respiratory improvement with Lasix tomorrow to consider further diuretic.  Per his wife he never smoked and had a PFT before  that did not show COPD or asthma.   -Continue mechanical ventilation with pressure control -Continue sedation with propofol and fentanyl for RASS score -3, -2 -Duoneb q 6h -PRN Duoneb -Will adjust Lasix dose tomorrow as needed -Try to keep patient at upright position to enhance ventilation  -Continue  -F/u ABG post intubation -ABG tomorrow -CXR tomorrow -Strict intact and out put -Weight daily -Reassess output and volume status tomorrow for daily Lasix dose  HTN: Current BP at 130-150s/80s  -Holding home antihypertensive meds anticipating drop while on sedatives.  Best practice:  Diet: NPO, tube feeding Pain/Anxiety/Delirium protocol (if indicated): Propofol, continuous and PRN Fentanyl VAP protocol (if indicated): Yes DVT prophylaxis: Lovenox GI prophylaxis: Pepcid Glucose control: None Mobility: Bed rest Code Status: Full Family Communication: I talked to patient's wife and updated her. Disposition: ICU  Labs   CBC: Recent Labs  Lab 06/09/19 1158 06/09/19 1340  WBC 8.8 9.5  NEUTROABS  --  6.6  HGB 15.3 14.8  HCT 54.3* 51.9  MCV 97.8 97.9  PLT 205 197    Basic Metabolic Panel: Recent Labs  Lab 06/09/19 1158  NA 140  K 4.2  CL 95*  CO2 37*  GLUCOSE 120*  BUN 10  CREATININE 0.88  CALCIUM 8.5*   GFR: Estimated Creatinine Clearance: 179.8 mL/min (by C-G formula based on SCr of 0.88 mg/dL). Recent Labs  Lab 06/09/19 1158 06/09/19 1340  WBC 8.8 9.5    Liver Function Tests: Recent Labs  Lab 06/09/19 1158  AST 19  ALT 15  ALKPHOS 69  BILITOT 0.5   PROT 8.2*  ALBUMIN 3.3*   No results for input(s): LIPASE, AMYLASE in the last 168 hours. No results for input(s): AMMONIA in the last 168 hours.  ABG No results found for: PHART, PCO2ART, PO2ART, HCO3, TCO2, ACIDBASEDEF, O2SAT   Coagulation Profile: Recent Labs  Lab 06/09/19 1311  INR 1.0    Cardiac Enzymes: No results for input(s): CKTOTAL, CKMB, CKMBINDEX, TROPONINI in the last 168 hours.  HbA1C: Hgb A1c MFr Bld  Date/Time Value Ref Range Status  01/09/2017 01:38 AM 5.5 4.8 - 5.6 % Final    Comment:    (NOTE)         Pre-diabetes: 5.7 - 6.4         Diabetes: >6.4         Glycemic control for adults with diabetes: <7.0     CBG: No results for input(s): GLUCAP in the last 168 hours.  Past Medical History  He,  has no past medical history on file.   Surgical History    Past Surgical History:  Procedure Laterality Date  . APPENDECTOMY    .  HERNIA REPAIR       Social History   reports that he has never smoked. He has never used smokeless tobacco. He reports current alcohol use. He reports that he does not use drugs.   Family History   His family history is not on file.   Allergies No Known Allergies   Home Medications  Prior to Admission medications   Medication Sig Start Date End Date Taking? Authorizing Provider  amLODipine (NORVASC) 5 MG tablet Take 5 mg by mouth daily. 12/14/17  Yes [provider]  furosemide (LASIX) 80 MG tablet Take 80 mg by mouth daily. 12/14/17  Yes [provider]  ipratropium-albuterol (DUONEB) 0.5-2.5 (3) MG/3ML SOLN Take 3 mLs by nebulization every 6 (six) hours as needed for wheezing. 12/14/17  Yes [provider]  naproxen sodium (ALEVE) 220 MG tablet Take 440 mg by mouth daily as needed (pain).   Yes [provider]  albuterol (PROVENTIL HFA;VENTOLIN HFA) 108 (90 Base) MCG/ACT inhaler Inhale 2 puffs into the lungs every 6 (six) hours as needed for wheezing or shortness of breath. Patient not  taking: Reported on 03/04/2018 01/10/17   Reymundo PollGuilloud, Carolyn, MD  HYDROcodone-acetaminophen (NORCO) 5-325 MG tablet Take 1-2 tablets by mouth every 6 (six) hours as needed. Patient not taking: Reported on 06/09/2019 03/05/18   Geoffery Lyonselo, Douglas, MD  lisinopril (PRINIVIL,ZESTRIL) 10 MG tablet Take 1 tablet (10 mg total) by mouth daily. Patient not taking: Reported on 03/04/2018 01/11/17   Reymundo PollGuilloud, Carolyn, MD  Tiotropium Bromide Monohydrate (SPIRIVA RESPIMAT) 1.25 MCG/ACT AERS Inhale 2 puffs into the lungs daily. Patient not taking: Reported on 03/04/2018 01/10/17   Reymundo PollGuilloud, Carolyn, MD     Critical care time:     Teola BradleyElham Phoua [facility] IM PGY-2 Pager [phone]

## 2019-06-09 NOTE — Progress Notes (Signed)
Jeffrey Skinner, MRN:  [id number], DOB:  May 04, 1956, LOS: 0 ADMISSION DATE:  06/09/2019, CONSULTATION DATE: 06/09/2019 REFERRING MD: Zenia Resides -ED Zacarias Pontes, CHIEF COMPLAINT: Acute respiratory failure  HPI/course in hospital  63 year old morbidly obese man who presented with acute shortness of breath requiring prompt intubation. Found to be in severe hypoxic hypercapnic respiratory failure. Morbid obesity with exceedingly limited functional capacity at home.  Barely walks spends most time in bed.  Has not been able to cope with weight loss for psychiatric reasons.  Past Medical History  History reviewed. No pertinent past medical history.   Past Surgical History:  Procedure Laterality Date  . APPENDECTOMY    . HERNIA REPAIR       Review of Systems:   Review of Systems  Unable to perform ROS: Critical illness    Social History   reports that he has never smoked. He has never used smokeless tobacco. He reports current alcohol use. He reports that he does not use drugs.   Family History   His family history is not on file.   Allergies No Known Allergies   Home Medications  Prior to Admission medications   Medication Sig Start Date End Date Taking? Authorizing Provider  amLODipine (NORVASC) 5 MG tablet Take 5 mg by mouth daily. 12/14/17  Yes [provider]  furosemide (LASIX) 80 MG tablet Take 80 mg by mouth daily. 12/14/17  Yes [provider]  ipratropium-albuterol (DUONEB) 0.5-2.5 (3) MG/3ML SOLN Take 3 mLs by nebulization every 6 (six) hours as needed for wheezing. 12/14/17  Yes [provider]  naproxen sodium (ALEVE) 220 MG tablet Take 440 mg by mouth daily as needed (pain).   Yes [provider]  albuterol (PROVENTIL HFA;VENTOLIN HFA) 108 (90 Base) MCG/ACT inhaler Inhale 2 puffs into the lungs every 6 (six) hours as needed for wheezing or shortness of breath. Patient not taking: Reported on 03/04/2018 01/10/17   Velna Ochs, MD   HYDROcodone-acetaminophen (NORCO) 5-325 MG tablet Take 1-2 tablets by mouth every 6 (six) hours as needed. Patient not taking: Reported on 06/09/2019 03/05/18   Veryl Speak, MD  lisinopril (PRINIVIL,ZESTRIL) 10 MG tablet Take 1 tablet (10 mg total) by mouth daily. Patient not taking: Reported on 03/04/2018 01/11/17   Velna Ochs, MD  Tiotropium Bromide Monohydrate (SPIRIVA RESPIMAT) 1.25 MCG/ACT AERS Inhale 2 puffs into the lungs daily. Patient not taking: Reported on 03/04/2018 01/10/17   Velna Ochs, MD     Interim history/subjective:  Intubated in ED.  Difficult to ventilate with severe hypercapnia and persistent hypoxia  Objective   Blood pressure 121/90, pulse 74, resp. rate (!) 28, height 6\' 5"  (1.956 m), weight (!) 226.8 kg, SpO2 97 %.    Vent Mode: PCV FiO2 (%):  [100 %] 100 % Set Rate:  [16 bmp-28 bmp] 28 bmp Vt Set:  [620 mL] 620 mL PEEP:  [5 cmH20-15 cmH20] 15 cmH20 Plateau Pressure:  [30 cmH20-31 cmH20] 31 cmH20  No intake or output data in the 24 hours ending 06/09/19 2017 Filed Weights   06/09/19 1154  Weight: (!) 226.8 kg    Examination: Physical Exam Constitutional:      Appearance: He is obese. He is ill-appearing and diaphoretic.  HENT:     Head: Normocephalic and atraumatic.  Neck:     Comments: Orally intubated Cardiovascular:     Rate and Rhythm: Normal rate and regular rhythm.     Heart sounds: Normal heart sounds.  Pulmonary:  Breath sounds: Normal breath sounds. No wheezing, rhonchi or rales.     Comments: Distant breath sounds.  No adventitial sounds.  Plateau pressure 36.  Intrinsic PEEP up to 19 initially Abdominal:     Palpations: Abdomen is soft.  Musculoskeletal:     Right lower leg: Edema present.     Left lower leg: Edema present.  Skin:    General: Skin is warm.     Capillary Refill: Capillary refill takes 2 to 3 seconds.  Neurological:     Comments: Sedated on propofol and fentanyl.   I performed a brief of care  echocardiogram which shows possible RV dysfunction but image quality was poor.  Ancillary tests (personally reviewed)  CBC: Recent Labs  Lab 06/09/19 1158 06/09/19 1340 06/09/19 1749  WBC 8.8 9.5  --   NEUTROABS  --  6.6  --   HGB 15.3 14.8 16.3  HCT 54.3* 51.9 48.0  MCV 97.8 97.9  --   PLT 205 197  --     Basic Metabolic Panel: Recent Labs  Lab 06/09/19 1158 06/09/19 1749  NA 140 140  K 4.2 4.4  CL 95*  --   CO2 37*  --   GLUCOSE 120*  --   BUN 10  --   CREATININE 0.88  --   CALCIUM 8.5*  --    GFR: Estimated Creatinine Clearance: 179.8 mL/min (by C-G formula based on SCr of 0.88 mg/dL). Recent Labs  Lab 06/09/19 1158 06/09/19 1340  WBC 8.8 9.5    Liver Function Tests: Recent Labs  Lab 06/09/19 1158  AST 19  ALT 15  ALKPHOS 69  BILITOT 0.5  PROT 8.2*  ALBUMIN 3.3*   No results for input(s): LIPASE, AMYLASE in the last 168 hours. No results for input(s): AMMONIA in the last 168 hours.  ABG    Component Value Date/Time   PHART 7.290 (L) 06/09/2019 1749   PCO2ART 83.5 (HH) 06/09/2019 1749   PO2ART 50.0 (L) 06/09/2019 1749   HCO3 40.2 (H) 06/09/2019 1749   TCO2 43 (H) 06/09/2019 1749   O2SAT 78.0 06/09/2019 1749     Coagulation Profile: Recent Labs  Lab 06/09/19 1311  INR 1.0    Cardiac Enzymes: No results for input(s): CKTOTAL, CKMB, CKMBINDEX, TROPONINI in the last 168 hours.  HbA1C: Hgb A1c MFr Bld  Date/Time Value Ref Range Status  01/09/2017 01:38 AM 5.5 4.8 - 5.6 % Final    Comment:    (NOTE)         Pre-diabetes: 5.7 - 6.4         Diabetes: >6.4         Glycemic control for adults with diabetes: <7.0     CBG: No results for input(s): GLUCAP in the last 168 hours.   Assessment & Plan:  Critically ill due to hypoxic hypercapnic respiratory failure requiring mechanical ventilation Difficult to ventilate given obesity. PEEP increased to promote lung recruitment. Respiratory rate decreased to allow for proper exhalation.  Ventilate patient in near seated position if possible to decrease restrictive physiology from obese abdomen.  Progressive respiratory failure. Cause unclear. BNP is low. X-ray consistent with poor inspiratory effort.  No clear infiltrate. No leukocytosis. No wheezing to support COPD exacerbation. Continue empiric bronchodilators. COVID 19 is negative Suspect primarily worsening obesity related hypoventilation. We will try diuresis. Anticoagulate empirically for possible PE.  Best practice:  Diet: N.p.o. Pain/Anxiety/Delirium protocol (if indicated): Fentanyl and propofol infusion VAP protocol (if indicated): Vent bundle in place  DVT prophylaxis: Full dose anticoagulant GI prophylaxis: Famotidine  Urinary catheter: Assessment of intravascular volume Glucose control: Sliding scale phase 1 Mobility: Bedrest Code Status: Full code Family Communication: I have updated his wife have suggested that he may have a prolonged hospitalization. Disposition: ICU   CRITICAL CARE Performed by: Lynnell Catalan   Total critical care time: 60 minutes  Critical care time was exclusive of separately billable procedures and treating other patients.  Critical care was necessary to treat or prevent imminent or life-threatening deterioration.  Critical care was time spent personally by me on the following activities: development of treatment plan with patient and/or surrogate as well as nursing, discussions with consultants, evaluation of patient's response to treatment, examination of patient, obtaining history from patient or surrogate, ordering and performing treatments and interventions, ordering and review of laboratory studies, ordering and review of radiographic studies, pulse oximetry, re-evaluation of patient's condition and participation in multidisciplinary rounds.  Lynnell Catalan, MD [facility] ICU Physician [facility]  Pager: [phone] Mobile: [phone] After hours:  [phone].     Lynnell Catalan, MD [facility] ICU Physician [facility] [place] [facility]  Pager: [phone] Mobile: [phone] After hours: [phone].  06/09/2019, 8:17 PM

## 2019-06-09 NOTE — ED Notes (Signed)
Notified ED MD that pt GCS 6

## 2019-06-09 NOTE — Code Documentation (Signed)
PORTABLE AT BEDSIDE (cxr)

## 2019-06-09 NOTE — Progress Notes (Signed)
eLink Physician-Brief Progress Note Patient Name: JOSUHA FONTANEZ DOB: 1955/11/18 MRN: [id number]   Date of Service  06/09/2019  HPI/Events of Note  Morbidly obese male with obesity hypoventilation syndrome admitted with hypoxemic hypercapnic respiratory failure and currently intubated.  eICU Interventions  New patient evaluation completed. No new orders.        Kerry Kass Ogan 06/09/2019, 9:08 PM

## 2019-06-09 NOTE — ED Provider Notes (Signed)
  Physical Exam  BP (!) 152/85   Pulse (!) 102   Resp (!) 29   Ht 1.956 m (6\' 5" )   Wt (!) 226.8 kg   SpO2 94%   BMI 59.29 kg/m   Physical Exam  ED Course/Procedures     Procedure Name: Intubation Date/Time: 06/09/2019 2:10 PM Performed by: Lacretia Leigh, MD Pre-anesthesia Checklist: Patient identified, Patient being monitored, Emergency Drugs available, Timeout performed and Suction available Oxygen Delivery Method: Non-rebreather mask Preoxygenation: Pre-oxygenation with 100% oxygen Induction Type: Rapid sequence Ventilation: Mask ventilation without difficulty Laryngoscope Size: Glidescope and 1 Grade View: Grade III Tube size: 7.5 mm Number of attempts: 2 Airway Equipment and Method: Video-laryngoscopy Placement Confirmation: ETT inserted through vocal cords under direct vision,  CO2 detector and Breath sounds checked- equal and bilateral Secured at: 25 cm Dental Injury: Teeth and Oropharynx as per pre-operative assessment  Difficulty Due To: Difficulty was anticipated Future Recommendations: Recommend- induction with short-acting agent, and alternative techniques readily available       MDM         Lacretia Leigh, MD 06/09/19 1439

## 2019-06-10 ENCOUNTER — Inpatient Hospital Stay (HOSPITAL_COMMUNITY): Payer: 59

## 2019-06-10 ENCOUNTER — Other Ambulatory Visit: Payer: Self-pay

## 2019-06-10 DIAGNOSIS — R0602 Shortness of breath: Secondary | ICD-10-CM

## 2019-06-10 DIAGNOSIS — R609 Edema, unspecified: Secondary | ICD-10-CM

## 2019-06-10 DIAGNOSIS — J9622 Acute and chronic respiratory failure with hypercapnia: Secondary | ICD-10-CM

## 2019-06-10 DIAGNOSIS — J9621 Acute and chronic respiratory failure with hypoxia: Principal | ICD-10-CM

## 2019-06-10 LAB — HEPARIN LEVEL (UNFRACTIONATED)
Heparin Unfractionated: <0.1 IU/mL — ABNORMAL LOW (ref 0.30–0.70)
Heparin Unfractionated: 0.4 IU/mL (ref 0.30–0.70)

## 2019-06-10 LAB — POCT I-STAT 7, (LYTES, BLD GAS, ICA,H+H)
Acid-Base Excess: 11 mmol/L — ABNORMAL HIGH (ref 0.0–2.0)
Acid-Base Excess: 13 mmol/L — ABNORMAL HIGH (ref 0.0–2.0)
Bicarbonate: 34.9 mmol/L — ABNORMAL HIGH (ref 20.0–28.0)
Bicarbonate: 37.9 mmol/L — ABNORMAL HIGH (ref 20.0–28.0)
Calcium, Ion: 1.06 mmol/L — ABNORMAL LOW (ref 1.15–1.40)
Calcium, Ion: 1.06 mmol/L — ABNORMAL LOW (ref 1.15–1.40)
HCT: 51 % (ref 39.0–52.0)
HCT: 46 % (ref 39.0–52.0)
Hemoglobin: 17.3 g/dL — ABNORMAL HIGH (ref 13.0–17.0)
Hemoglobin: 15.6 g/dL (ref 13.0–17.0)
O2 Saturation: 95 %
O2 Saturation: 97 %
Patient temperature: 97.7
Patient temperature: 98.7
Potassium: 3.9 mmol/L (ref 3.5–5.1)
Potassium: 3.6 mmol/L (ref 3.5–5.1)
Sodium: 138 mmol/L (ref 135–145)
Sodium: 140 mmol/L (ref 135–145)
TCO2: 36 mmol/L — ABNORMAL HIGH (ref 22–32)
TCO2: 39 mmol/L — ABNORMAL HIGH (ref 22–32)
pCO2 arterial: 39.1 mmHg (ref 32.0–48.0)
pCO2 arterial: 48.7 mmHg — ABNORMAL HIGH (ref 32.0–48.0)
pH, Arterial: 7.557 — ABNORMAL HIGH (ref 7.350–7.450)
pH, Arterial: 7.499 — ABNORMAL HIGH (ref 7.350–7.450)
pO2, Arterial: 63 mmHg — ABNORMAL LOW (ref 83.0–108.0)
pO2, Arterial: 81 mmHg — ABNORMAL LOW (ref 83.0–108.0)

## 2019-06-10 LAB — CBC
HCT: 49.9 % (ref 39.0–52.0)
Hemoglobin: 15.1 g/dL (ref 13.0–17.0)
MCH: 27.7 pg (ref 26.0–34.0)
MCHC: 30.3 g/dL (ref 30.0–36.0)
MCV: 91.6 fL (ref 80.0–100.0)
Platelets: 182 10*3/uL (ref 150–400)
RBC: 5.45 MIL/uL (ref 4.22–5.81)
RDW: 15.9 % — ABNORMAL HIGH (ref 11.5–15.5)
WBC: 9.5 10*3/uL (ref 4.0–10.5)
nRBC: 0.2 % (ref 0.0–0.2)

## 2019-06-10 LAB — GLUCOSE, CAPILLARY
Glucose-Capillary: 111 mg/dL — ABNORMAL HIGH (ref 70–99)
Glucose-Capillary: 113 mg/dL — ABNORMAL HIGH (ref 70–99)
Glucose-Capillary: 118 mg/dL — ABNORMAL HIGH (ref 70–99)
Glucose-Capillary: 130 mg/dL — ABNORMAL HIGH (ref 70–99)
Glucose-Capillary: 136 mg/dL — ABNORMAL HIGH (ref 70–99)
Glucose-Capillary: 99 mg/dL (ref 70–99)

## 2019-06-10 LAB — BASIC METABOLIC PANEL
Anion gap: 14 (ref 5–15)
BUN: 11 mg/dL (ref 8–23)
CO2: 29 mmol/L (ref 22–32)
Calcium: 8.1 mg/dL — ABNORMAL LOW (ref 8.9–10.3)
Chloride: 95 mmol/L — ABNORMAL LOW (ref 98–111)
Creatinine, Ser: 1.04 mg/dL (ref 0.61–1.24)
GFR calc Af Amer: >60 mL/min (ref >60)
GFR calc non Af Amer: >60 mL/min (ref >60)
Glucose, Bld: 117 mg/dL — ABNORMAL HIGH (ref 70–99)
Potassium: 4.2 mmol/L (ref 3.5–5.1)
Sodium: 138 mmol/L (ref 135–145)

## 2019-06-10 LAB — ECHOCARDIOGRAM COMPLETE
Height: 74 in
Weight: 8000 oz

## 2019-06-10 LAB — HIV ANTIBODY (ROUTINE TESTING W REFLEX): HIV Screen 4th Generation wRfx: NONREACTIVE

## 2019-06-10 LAB — MRSA PCR SCREENING: MRSA by PCR: NEGATIVE

## 2019-06-10 LAB — TRIGLYCERIDES: Triglycerides: 63 mg/dL (ref <150)

## 2019-06-10 MED ORDER — FUROSEMIDE 10 MG/ML IJ SOLN
80.0000 mg | Freq: Two times a day (BID) | INTRAMUSCULAR | Status: AC
  Administered 2019-06-10 – 2019-06-11 (×3): 80 mg via INTRAVENOUS
  Filled 2019-06-10 (×3): qty 8

## 2019-06-10 MED ORDER — CHLORHEXIDINE GLUCONATE 0.12% ORAL RINSE (MEDLINE KIT)
15.0000 mL | Freq: Two times a day (BID) | OROMUCOSAL | Status: DC
  Administered 2019-06-10 – 2019-06-11 (×4): 15 mL via OROMUCOSAL

## 2019-06-10 MED ORDER — PRO-STAT SUGAR FREE PO LIQD
60.0000 mL | Freq: Four times a day (QID) | ORAL | Status: DC
  Administered 2019-06-10 – 2019-06-11 (×4): 60 mL
  Filled 2019-06-10 (×4): qty 60

## 2019-06-10 MED ORDER — ORAL CARE MOUTH RINSE
15.0000 mL | OROMUCOSAL | Status: DC
  Administered 2019-06-10 – 2019-06-12 (×19): 15 mL via OROMUCOSAL

## 2019-06-10 MED ORDER — VITAL HIGH PROTEIN PO LIQD
1000.0000 mL | ORAL | Status: DC
  Administered 2019-06-10 – 2019-06-11 (×3): 1000 mL

## 2019-06-10 MED ORDER — HEPARIN BOLUS VIA INFUSION
4000.0000 [IU] | Freq: Once | INTRAVENOUS | Status: AC
  Administered 2019-06-10: 4000 [IU] via INTRAVENOUS
  Filled 2019-06-10: qty 4000

## 2019-06-10 MED ORDER — PERFLUTREN LIPID MICROSPHERE
1.0000 mL | INTRAVENOUS | Status: AC | PRN
  Administered 2019-06-10: 15:00:00 3 mL via INTRAVENOUS
  Filled 2019-06-10: qty 10

## 2019-06-10 MED ORDER — CHLORHEXIDINE GLUCONATE CLOTH 2 % EX PADS
6.0000 | MEDICATED_PAD | Freq: Every day | CUTANEOUS | Status: DC
  Administered 2019-06-11 (×2): 6 via TOPICAL

## 2019-06-10 NOTE — Progress Notes (Signed)
  Echocardiogram 2D Echocardiogram has been performed.  Rohen Kimes G Odis Turck 06/10/2019, 3:09 PM

## 2019-06-10 NOTE — Progress Notes (Signed)
Summary- Pt rec'd from ER via stretcher at approximately 1950, moved to bed x 6 assist. Pt sedated on vent, unable to assess orientation or orient to room and unit. Dr Lynetta Mare on floor to assess pt on arrival. Pt spouse, Jeffrey Skinner in room to see pt at approximately 2200, able to provide information for admission assessment. Education provided to spouse regarding infusing medications and unit monitoring. Plan of care discussed with / questions answered to wife's satisfaction.

## 2019-06-10 NOTE — Progress Notes (Signed)
Bilateral lower extremity venous duplex completed. Preliminary results in Chart review CV Proc. [place] Bauer Ausborn,RVS 06/10/2019 5:30 PM

## 2019-06-10 NOTE — Progress Notes (Signed)
Initial Nutrition Assessment  DOCUMENTATION CODES:   Morbid obesity  INTERVENTION:   Tube feeding: - Vital High Protein @ 40 ml/hr (960 ml/day) via OG tube - Pro-stat 60 ml QID  Tube feeding regimen provides 1760 kcal, 204 grams of protein, and 803 ml of H2O.   Tube feeding regimen and current propofol provides 2299 total kcal (100% of needs).  NUTRITION DIAGNOSIS:   Inadequate oral intake related to inability to eat as evidenced by NPO status.  GOAL:   Provide needs based on ASPEN/SCCM guidelines  MONITOR:   Vent status, TF tolerance, Labs, Weight trends, I & O's  REASON FOR ASSESSMENT:   Ventilator, Consult Enteral/tube feeding initiation and management  ASSESSMENT:   63 year old male who presented to the ED on 9/09 with SOB and LE swelling. PMH of respiratory failure with hypoxia, HTN, OSA/OHS, COPD, CHF. Pt required intubation in the ED. X-ray showing CHF.  Discussed pt with RN and during ICU rounds. Per RN, trying to wean sedation.  OG tube in place, currently infusing TF. RD consulted for tube feeding management. Will adjust current regimen to better meet pt's needs.  Current TF: Vital High Protein @ 20 ml/hr  Patient is currently intubated on ventilator support MV: 10.7 L/min Temp (24hrs), Avg:98.1 F (36.7 C), Min:97.5 F (36.4 C), Max:98.9 F (37.2 C) BP (cuff): 122/77 MAP (cuff): 87  Drips: Propofol: 20.4 ml/hr (provides 539 kcal daily from lipid) Fentanyl: 7.5 ml/hr Heparin: 23 ml/hr  Medications reviewed and include: Pepcid, Solu-medrol, IV abx  Labs reviewed. CBG's: 113-136 x 24 hours  UOP: 1100 ml x 12 hours  NUTRITION - FOCUSED PHYSICAL EXAM:    Most Recent Value  Orbital Region  No depletion  Upper Arm Region  No depletion  Thoracic and Lumbar Region  No depletion  Buccal Region  No depletion  Temple Region  No depletion  Clavicle Bone Region  No depletion  Clavicle and Acromion Bone Region  No depletion  Scapular Bone Region   No depletion  Dorsal Hand  No depletion  Patellar Region  No depletion  Anterior Thigh Region  No depletion  Posterior Calf Region  No depletion  Edema (RD Assessment)  Mild [generalized]  Hair  Reviewed  Eyes  Reviewed  Mouth  Reviewed  Skin  Reviewed  Nails  Reviewed       Diet Order:   Diet Order            Diet NPO time specified  Diet effective now              EDUCATION NEEDS:   No education needs have been identified at this time  Skin:  Skin Assessment: Reviewed RN Assessment  Last BM:  no documented BM  Height:   Ht Readings from Last 1 Encounters:  06/09/19 6\' 5"  (1.956 m)    Weight:   Wt Readings from Last 1 Encounters:  06/09/19 (!) 226.8 kg    Ideal Body Weight:  94.5 kg  BMI:  Body mass index is 59.29 kg/m.  Estimated Nutritional Needs:   Kcal:  5631-4970  Protein:  200-230 grams  Fluid:  per MD    Gaynell Face, MS, RD, LDN Inpatient Clinical Dietitian Pager: [phone] Weekend/After Hours: [phone]

## 2019-06-10 NOTE — Progress Notes (Addendum)
Jeffrey Skinner, MRN:  [id number], DOB:  05/01/1956, LOS: 1 ADMISSION DATE:  06/09/2019, CONSULTATION DATE: 06/09/2019 REFERRING MD: Zenia Resides -ED Zacarias Pontes, CHIEF COMPLAINT: Acute respiratory failure  HPI/course in hospital  63 y.o. M with PMH of obesity and OSA who presented to the ED with worsening of his chronic SOB after doing some yard work.  He was initially satting well on room air, but had worsening respiratory failure in the ED that required Bipap and then intubation due to severe hypercarbic respiratory failure. History of admission for respiratory failure in 2018 where he was diagnosed with multi-factorial hypoxic respiratory failure likely secondary to undiagnosed COPD, OSA and obesity hypoventilation syndrome.  Has not been able to cope with weight loss for psychiatric reasons.  He was admitted to the ICU for ventilator support.  Past Medical History   Past Medical History:  Diagnosis Date  . Arthritis   . Depression   . Dyspnea   . Hypertension   . Sleep apnea      Past Surgical History:  Procedure Laterality Date  . APPENDECTOMY    . HERNIA REPAIR    . VASECTOMY  1998       Allergies No Known Allergies   Home Medications  Prior to Admission medications   Medication Sig Start Date End Date Taking? Authorizing Provider  amLODipine (NORVASC) 5 MG tablet Take 5 mg by mouth daily. 12/14/17  Yes [provider]  furosemide (LASIX) 80 MG tablet Take 80 mg by mouth daily. 12/14/17  Yes [provider]  ipratropium-albuterol (DUONEB) 0.5-2.5 (3) MG/3ML SOLN Take 3 mLs by nebulization every 6 (six) hours as needed for wheezing. 12/14/17  Yes [provider]  naproxen sodium (ALEVE) 220 MG tablet Take 440 mg by mouth daily as needed (pain).   Yes [provider]  albuterol (PROVENTIL HFA;VENTOLIN HFA) 108 (90 Base) MCG/ACT inhaler Inhale 2 puffs into the lungs every 6 (six) hours as needed for wheezing or shortness of breath. Patient not  taking: Reported on 03/04/2018 01/10/17   Velna Ochs, MD  HYDROcodone-acetaminophen (NORCO) 5-325 MG tablet Take 1-2 tablets by mouth every 6 (six) hours as needed. Patient not taking: Reported on 06/09/2019 03/05/18   Veryl Speak, MD  lisinopril (PRINIVIL,ZESTRIL) 10 MG tablet Take 1 tablet (10 mg total) by mouth daily. Patient not taking: Reported on 03/04/2018 01/11/17   Velna Ochs, MD  Tiotropium Bromide Monohydrate (SPIRIVA RESPIMAT) 1.25 MCG/ACT AERS Inhale 2 puffs into the lungs daily. Patient not taking: Reported on 03/04/2018 01/10/17   Velna Ochs, [place] Hospital Events   9/9 Admitted to PCCM  Consults:    Procedures:  9/9 ETT  Significant Diagnostic Tests:  9/10 CXR>>stable bilateral opacities  Micro Data:  9/9 MRSA screen>>neg 9/9 Sars-CoV-2>>neg  Antimicrobials:  Azithromycin 9/9 only Ceftriaxone 9/9 only   Interim history/subjective:  Admitted and intubated last night, sedated and ventilator dependent  Objective   Blood pressure 120/79, pulse 64, temperature 97.7 F (36.5 C), temperature source Axillary, resp. rate (!) 28, height 6\' 5"  (1.956 m), weight (!) 226.8 kg, SpO2 98 %.    Vent Mode: PCV FiO2 (%):  [100 %] 100 % Set Rate:  [16 bmp-28 bmp] 28 bmp Vt Set:  [620 mL] 620 mL PEEP:  [5 cmH20-15 cmH20] 15 cmH20 Plateau Pressure:  [28 cmH20-33 cmH20] 29 cmH20   Intake/Output Summary (Last 24 hours) at 06/10/2019 0820 Last data filed at 06/10/2019 0700 Gross per 24 hour  Intake  1568.08 ml  Output 1100 ml  Net 468.08 ml   Filed Weights   06/09/19 1154  Weight: (!) 226.8 kg     General:  Morbidly obese M, sedated and intubated HEENT: MM pink/moist Neuro: responsive to painful stimuli on propofol and fentanyl CV: s1s2 RRR, no m/r/g PULM:  Decreased breath sounds bilateral bases GI: soft, bsx4 active  Extremities: warm/dry, 2+ pitting edema bilateral LE Skin: no rashes or lesions   Ancillary tests (personally reviewed)   CBC: Recent Labs  Lab 06/09/19 1158 06/09/19 1340 06/09/19 1749 06/09/19 2225 06/10/19 0407 06/10/19 0550  WBC 8.8 9.5  --   --   --  9.5  NEUTROABS  --  6.6  --   --   --   --   HGB 15.3 14.8 16.3 17.0 17.3* 15.1  HCT 54.3* 51.9 48.0 50.0 51.0 49.9  MCV 97.8 97.9  --   --   --  91.6  PLT 205 197  --   --   --  182    Basic Metabolic Panel: Recent Labs  Lab 06/09/19 1158 06/09/19 1749 06/09/19 2225 06/10/19 0407 06/10/19 0550  NA 140 140 138 138 138  K 4.2 4.4 4.4 3.9 4.2  CL 95*  --   --   --  95*  CO2 37*  --   --   --  29  GLUCOSE 120*  --   --   --  117*  BUN 10  --   --   --  11  CREATININE 0.88  --   --   --  1.04  CALCIUM 8.5*  --   --   --  8.1*   GFR: Estimated Creatinine Clearance: 152.1 mL/min (by C-G formula based on SCr of 1.04 mg/dL). Recent Labs  Lab 06/09/19 1158 06/09/19 1340 06/10/19 0550  WBC 8.8 9.5 9.5    Liver Function Tests: Recent Labs  Lab 06/09/19 1158  AST 19  ALT 15  ALKPHOS 69  BILITOT 0.5  PROT 8.2*  ALBUMIN 3.3*   No results for input(s): LIPASE, AMYLASE in the last 168 hours. No results for input(s): AMMONIA in the last 168 hours.  ABG    Component Value Date/Time   PHART 7.557 (H) 06/10/2019 0407   PCO2ART 39.1 06/10/2019 0407   PO2ART 63.0 (L) 06/10/2019 0407   HCO3 34.9 (H) 06/10/2019 0407   TCO2 36 (H) 06/10/2019 0407   O2SAT 95.0 06/10/2019 0407     Coagulation Profile: Recent Labs  Lab 06/09/19 1311  INR 1.0    Cardiac Enzymes: No results for input(s): CKTOTAL, CKMB, CKMBINDEX, TROPONINI in the last 168 hours.  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 -Noted he needed PFT's outpatient in 2018, unclear if these were done -Possibly an element of volume overload, though BNP low received Lasix 80mg  on 9/9 with ~1L UOP  -  started empirically on Heparin for possible PE -No evidence of cardiac etiology P: -Obtain Echocardiogram and LE dopplers, consider CT chest if able to accommodate body habitus -Continue empiric heparin -Change vent settings from PC to [facility], attempt to decrease PEEP, repeat ABG -On Fentanyl and Propofol gtt, wean for SAT -Increase Lasix to 80mg  bid, doubt COPD exacerbation so will d/c Azithromycin and Solumedrol, continue duonebs    HTN -holding home Lisinopril     Depression/Concern for suicidal ideation -ED reported to ICU nursing that pt mentioned he "did not want to live like this anymore" P: -nursing requesting psych eval once pt is extubated     Best practice:  Diet: N.p.o. Pain/Anxiety/Delirium protocol (if indicated): Fentanyl and propofol infusion VAP protocol (if indicated): Vent bundle in place DVT prophylaxis: Full dose anticoagulant GI prophylaxis: Famotidine  Urinary catheter: Assessment of intravascular volume Glucose control: Sliding scale phase 1 Mobility: Bedrest Code Status: Full code Family Communication: Pt's wife updated Disposition: ICU   CRITICAL CARE Performed by: Darcella Gasman Etola Mull   Total critical care time: 50 minutes  Critical care time was exclusive of separately billable procedures and treating other patients.  Critical care was necessary to treat or prevent imminent or life-threatening deterioration secondary to acute respiratory failure requiring ventilator management.  Critical care was time spent personally by me on the following activities: development of treatment plan with patient and/or surrogate as well as nursing, discussions with consultants, evaluation of  patient's response to treatment, examination of patient, obtaining history from patient or surrogate, ordering and performing treatments and interventions, ordering and review of laboratory studies, ordering and review of radiographic studies, pulse oximetry and re-evaluation of patient's condition.    Darcella Gasman Darric Plante, PA-C [place] [facility]  Pager# [phone], if no answer [phone]

## 2019-06-10 NOTE — Progress Notes (Signed)
ANTICOAGULATION CONSULT NOTE   Pharmacy Consult for Heparin Indication: pulmonary embolus  No Known Allergies  Patient Measurements: Height: 6\' 5"  (195.6 cm) Weight: (!) 500 lb (226.8 kg) IBW/kg (Calculated) : 89.1 Heparin Dosing Weight: 146 kg  Vital Signs: Temp: 97.7 F (36.5 C) (09/10 0400) Temp Source: Axillary (09/10 0400) BP: 119/79 (09/10 0700) Pulse Rate: 59 (09/10 0700)  Labs: Recent Labs    06/09/19 1158 06/09/19 1311 06/09/19 1326 06/09/19 1340 06/09/19 1534  06/09/19 2225 06/10/19 0407 06/10/19 0550  HGB 15.3  --   --  14.8  --    < > 17.0 17.3* 15.1  HCT 54.3*  --   --  51.9  --    < > 50.0 51.0 49.9  PLT 205  --   --  197  --   --   --   --  182  APTT  --  28  --   --   --   --   --   --   --   LABPROT  --  13.5  --   --   --   --   --   --   --   INR  --  1.0  --   --   --   --   --   --   --   HEPARINUNFRC  --   --   --   --   --   --   --   --  <0.10*  CREATININE 0.88  --   --   --   --   --   --   --  1.04  TROPONINIHS  --   --  4  --  4  --   --   --   --    < > = values in this interval not displayed.    Estimated Creatinine Clearance: 152.1 mL/min (by C-G formula based on SCr of 1.04 mg/dL).   Assessment: 63 yr old morbidly obese male admitted with acute shortness of breath requiring prompt intubation, with progressive respiratory failure of unclear cause; pharmacy consulted to dose/monitor heparin for possible PE.  Other medical history includes: HTN, OSA, chronic resp failure, heart failure, COPD  9/10 AM update:  Heparin level undetectable due to issue with heparin not being connected to line  Goal of Therapy:  Heparin level 0.3-0.7 units/ml Monitor platelets by anticoagulation protocol: Yes   Plan:  Heparin 4000 units BOLUS Cont heparin at 2300 units/hr Re-time heparin level for [place], PharmD, BCPS Clinical Pharmacist Phone: [phone]

## 2019-06-10 NOTE — Progress Notes (Signed)
ANTICOAGULATION CONSULT NOTE   Pharmacy Consult for Heparin Indication: pulmonary embolus  No Known Allergies  Patient Measurements: Height: 6\' 2"  (188 cm) Weight: (!) 500 lb (226.8 kg) IBW/kg (Calculated) : 82.2 Heparin Dosing Weight: 146 kg  Vital Signs: Temp: 98.7 F (37.1 C) (09/10 1204) Temp Source: Oral (09/10 1204) BP: 95/60 (09/10 1400) Pulse Rate: 62 (09/10 1400)  Labs: Recent Labs    06/09/19 1158 06/09/19 1311 06/09/19 1326 06/09/19 1340 06/09/19 1534  06/09/19 2225 06/10/19 0407 06/10/19 0550 06/10/19 1358  HGB 15.3  --   --  14.8  --    < > 17.0 17.3* 15.1  --   HCT 54.3*  --   --  51.9  --    < > 50.0 51.0 49.9  --   PLT 205  --   --  197  --   --   --   --  182  --   APTT  --  28  --   --   --   --   --   --   --   --   LABPROT  --  13.5  --   --   --   --   --   --   --   --   INR  --  1.0  --   --   --   --   --   --   --   --   HEPARINUNFRC  --   --   --   --   --   --   --   --  <0.10* 0.40  CREATININE 0.88  --   --   --   --   --   --   --  1.04  --   TROPONINIHS  --   --  4  --  4  --   --   --   --   --    < > = values in this interval not displayed.    Estimated Creatinine Clearance: 147.7 mL/min (by C-G formula based on SCr of 1.04 mg/dL).   Assessment: 63 yr old morbidly obese male admitted with acute shortness of breath requiring prompt intubation, with progressive respiratory failure of unclear cause; pharmacy consulted to dose/monitor heparin for possible PE. No anticoagulants PTA.  Other medical history includes: HTN, OSA, chronic resp failure, heart failure, COPD  Heparin level therapeutic at 0.4 on heparin 2300 units/hr. No infusion issues or bleeding noted. CBC wnl.  Goal of Therapy:  Heparin level 0.3-0.7 units/ml Monitor platelets by anticoagulation protocol: Yes   Plan:  Cont heparin at 2300 units/hr Daily heparin level, CBC Monitor for bleeding  Vertis Kelch, PharmD PGY2 Cardiology Pharmacy Resident Phone [phone] 06/10/2019       2:37 PM  Please check [email or web address] for unit-specific pharmacist phone numbers

## 2019-06-11 ENCOUNTER — Other Ambulatory Visit: Payer: Self-pay

## 2019-06-11 ENCOUNTER — Inpatient Hospital Stay (HOSPITAL_COMMUNITY): Payer: 59

## 2019-06-11 LAB — POCT I-STAT 7, (LYTES, BLD GAS, ICA,H+H)
Acid-Base Excess: 11 mmol/L — ABNORMAL HIGH (ref 0.0–2.0)
Bicarbonate: 36.6 mmol/L — ABNORMAL HIGH (ref 20.0–28.0)
Calcium, Ion: 1.1 mmol/L — ABNORMAL LOW (ref 1.15–1.40)
HCT: 43 % (ref 39.0–52.0)
Hemoglobin: 14.6 g/dL (ref 13.0–17.0)
O2 Saturation: 95 %
Patient temperature: 98.6
Potassium: 3.3 mmol/L — ABNORMAL LOW (ref 3.5–5.1)
Sodium: 140 mmol/L (ref 135–145)
TCO2: 38 mmol/L — ABNORMAL HIGH (ref 22–32)
pCO2 arterial: 50.8 mmHg — ABNORMAL HIGH (ref 32.0–48.0)
pH, Arterial: 7.466 — ABNORMAL HIGH (ref 7.350–7.450)
pO2, Arterial: 72 mmHg — ABNORMAL LOW (ref 83.0–108.0)

## 2019-06-11 LAB — CBC
HCT: 47.1 % (ref 39.0–52.0)
Hemoglobin: 14.1 g/dL (ref 13.0–17.0)
MCH: 27.5 pg (ref 26.0–34.0)
MCHC: 29.9 g/dL — ABNORMAL LOW (ref 30.0–36.0)
MCV: 91.8 fL (ref 80.0–100.0)
Platelets: 191 10*3/uL (ref 150–400)
RBC: 5.13 MIL/uL (ref 4.22–5.81)
RDW: 16.4 % — ABNORMAL HIGH (ref 11.5–15.5)
WBC: 9.9 10*3/uL (ref 4.0–10.5)
nRBC: 0 % (ref 0.0–0.2)

## 2019-06-11 LAB — COMPREHENSIVE METABOLIC PANEL
ALT: 18 U/L (ref 0–44)
AST: 22 U/L (ref 15–41)
Albumin: 2.3 g/dL — ABNORMAL LOW (ref 3.5–5.0)
Alkaline Phosphatase: 50 U/L (ref 38–126)
Anion gap: 12 (ref 5–15)
BUN: 19 mg/dL (ref 8–23)
CO2: 32 mmol/L (ref 22–32)
Calcium: 7.7 mg/dL — ABNORMAL LOW (ref 8.9–10.3)
Chloride: 98 mmol/L (ref 98–111)
Creatinine, Ser: 1.24 mg/dL (ref 0.61–1.24)
GFR calc Af Amer: >60 mL/min (ref >60)
GFR calc non Af Amer: >60 mL/min (ref >60)
Glucose, Bld: 97 mg/dL (ref 70–99)
Potassium: 3.7 mmol/L (ref 3.5–5.1)
Sodium: 142 mmol/L (ref 135–145)
Total Bilirubin: 1.1 mg/dL (ref 0.3–1.2)
Total Protein: 6.2 g/dL — ABNORMAL LOW (ref 6.5–8.1)

## 2019-06-11 LAB — GLUCOSE, CAPILLARY
Glucose-Capillary: 102 mg/dL — ABNORMAL HIGH (ref 70–99)
Glucose-Capillary: 109 mg/dL — ABNORMAL HIGH (ref 70–99)
Glucose-Capillary: 86 mg/dL (ref 70–99)
Glucose-Capillary: 90 mg/dL (ref 70–99)
Glucose-Capillary: 92 mg/dL (ref 70–99)

## 2019-06-11 LAB — HEPARIN LEVEL (UNFRACTIONATED)
Heparin Unfractionated: 0.2 IU/mL — ABNORMAL LOW (ref 0.30–0.70)
Heparin Unfractionated: 0.26 IU/mL — ABNORMAL LOW (ref 0.30–0.70)

## 2019-06-11 LAB — MAGNESIUM: Magnesium: 1.7 mg/dL (ref 1.7–2.4)

## 2019-06-11 LAB — TRIGLYCERIDES: Triglycerides: 69 mg/dL (ref <150)

## 2019-06-11 MED ORDER — HEPARIN BOLUS VIA INFUSION
3000.0000 [IU] | Freq: Once | INTRAVENOUS | Status: AC
  Administered 2019-06-11: 06:00:00 3000 [IU] via INTRAVENOUS
  Filled 2019-06-11: qty 3000

## 2019-06-11 MED ORDER — SODIUM CHLORIDE 0.9 % IV SOLN
INTRAVENOUS | Status: DC
  Administered 2019-06-11: 09:00:00 via INTRAVENOUS

## 2019-06-11 MED ORDER — HEPARIN BOLUS VIA INFUSION
3000.0000 [IU] | Freq: Once | INTRAVENOUS | Status: AC
  Administered 2019-06-11: 3000 [IU] via INTRAVENOUS
  Filled 2019-06-11: qty 3000

## 2019-06-11 MED ORDER — POTASSIUM CHLORIDE 20 MEQ PO PACK
40.0000 meq | PACK | Freq: Once | ORAL | Status: AC
  Administered 2019-06-11: 40 meq via ORAL
  Filled 2019-06-11: qty 2

## 2019-06-11 MED ORDER — LACTATED RINGERS IV BOLUS
500.0000 mL | Freq: Once | INTRAVENOUS | Status: AC
  Administered 2019-06-11: 05:00:00 500 mL via INTRAVENOUS

## 2019-06-11 MED ORDER — POTASSIUM CHLORIDE 20 MEQ/15ML (10%) PO SOLN
20.0000 meq | ORAL | Status: AC
  Administered 2019-06-11 (×2): 20 meq
  Filled 2019-06-11 (×2): qty 15

## 2019-06-11 MED ORDER — MAGNESIUM SULFATE 2 GM/50ML IV SOLN
2.0000 g | Freq: Once | INTRAVENOUS | Status: AC
  Administered 2019-06-11: 2 g via INTRAVENOUS
  Filled 2019-06-11: qty 50

## 2019-06-11 NOTE — Progress Notes (Signed)
Approx [id number]  Weaned sedation d/t soft BP. With Diprivan gtt @ 20 mcg/kg/min BP improved but pt became agitated, hitting siderails. Fentanyl gtt at 50 mcg/hr  Pt follows commands, nods yes appropriately, wiggles toes. Explained events and care being given, provided reassurance.Pt endorses discomfort, fentanyl/diprivan gtts increased.   Mitts remain on for safety.

## 2019-06-11 NOTE — Progress Notes (Signed)
RT NOTE: Changes to vent made per MD as follows: RR 20, PC 35, FIO2 50%, PEEP 8. Vitals are stable. RT will continue to monitor.

## 2019-06-11 NOTE — Progress Notes (Signed)
Jeffrey Skinner, MRN:  [id number], DOB:  [birth year], LOS: 2 ADMISSION DATE:  06/09/2019, CONSULTATION DATE: 06/09/2019 REFERRING MD: Zenia Resides -ED Zacarias Pontes, CHIEF COMPLAINT: Acute respiratory failure  HPI/course in hospital  63 y.o. M with PMH of obesity and OSA who presented to the ED with worsening of his chronic SOB after doing some yard work.  He was initially satting well on room air, but had worsening respiratory failure in the ED that required Bipap and then intubation due to severe hypercarbic respiratory failure. History of admission for respiratory failure in 2018 where he was diagnosed with multi-factorial hypoxic respiratory failure likely secondary to undiagnosed COPD, OSA and obesity hypoventilation syndrome.  Has not been able to cope with weight loss for psychiatric reasons.  He was admitted to the ICU for ventilator support.  Past Medical History   Past Medical History:  Diagnosis Date  . Arthritis   . Depression   . Dyspnea   . Hypertension   . Sleep apnea      Past Surgical History:  Procedure Laterality Date  . APPENDECTOMY    . HERNIA REPAIR    . VASECTOMY  1998    Allergies No Known Allergies   Home Medications  Prior to Admission medications   Medication Sig Start Date End Date Taking? Authorizing Provider  amLODipine (NORVASC) 5 MG tablet Take 5 mg by mouth daily. 12/14/17  Yes [provider]  furosemide (LASIX) 80 MG tablet Take 80 mg by mouth daily. 12/14/17  Yes [provider]  ipratropium-albuterol (DUONEB) 0.5-2.5 (3) MG/3ML SOLN Take 3 mLs by nebulization every 6 (six) hours as needed for wheezing. 12/14/17  Yes [provider]  naproxen sodium (ALEVE) 220 MG tablet Take 440 mg by mouth daily as needed (pain).   Yes [provider]  albuterol (PROVENTIL HFA;VENTOLIN HFA) 108 (90 Base) MCG/ACT inhaler Inhale 2 puffs into the lungs every 6 (six) hours as needed for wheezing or shortness of breath. Patient not taking:  Reported on 03/04/2018 01/10/17   Velna Ochs, MD  HYDROcodone-acetaminophen (NORCO) 5-325 MG tablet Take 1-2 tablets by mouth every 6 (six) hours as needed. Patient not taking: Reported on 06/09/2019 03/05/18   Veryl Speak, MD  lisinopril (PRINIVIL,ZESTRIL) 10 MG tablet Take 1 tablet (10 mg total) by mouth daily. Patient not taking: Reported on 03/04/2018 01/11/17   Velna Ochs, MD  Tiotropium Bromide Monohydrate (SPIRIVA RESPIMAT) 1.25 MCG/ACT AERS Inhale 2 puffs into the lungs daily. Patient not taking: Reported on 03/04/2018 01/10/17   Velna Ochs, MD    Significant Hospital Events   9/9 Admitted to PCCM  Consults:    Procedures:  9/9 ETT  Significant Diagnostic Tests:  9/10 CXR>>stable bilateral opacities 9/10 Bilateral Lower Extremity Doppler US>> No signs of DVT but limited by body habitus 9/10 Echocardiogram>> Normal LV function, EF 60-65% with moderate concentric LV hypertrophy. RV has moderately decreased ventricular function with enlarged cavity, concerning for PE. Unable to assess RV systolic pressure. IVC enlarged with less than 50% respiratory variability. 9/11 CXR>> R basilar atelectasis with persistent atelectasis versus consolidation at L base.  Micro Data:  9/9 MRSA screen>>neg 9/9 Sars-CoV-2>>neg  Antimicrobials:  Azithromycin 9/9 only Ceftriaxone 9/9 only   Interim history/subjective:  Overnight nursing reports restlessness and biting of the ET tube when the patient is cared for. When sedation was briefly decreased, nursing reports the patient was able to follow commands, nod yes, and express discomfort. Overnight, the patient had dyssynchronous breathing frequently before resolving  with increased sedation. He also had several bouts of hypotension overnight and this AM, which resolved with administration of a 500 mL LR bolus.  Objective   Blood pressure 107/74, pulse 80, temperature 98.6 F (37 C), temperature source Oral, resp. rate (!) 24, height 6'  2" (1.88 m), weight (!) 226.8 kg, SpO2 92 %.    Vent Mode: PCV FiO2 (%):  [60 %-80 %] 60 % Set Rate:  [24 bmp-28 bmp] 24 bmp Vt Set:  [650 mL] 650 mL PEEP:  [8 cmH20-15 cmH20] 8 cmH20 Plateau Pressure:  [30 cmH20-40 cmH20] 32 cmH20   Intake/Output Summary (Last 24 hours) at 06/11/2019 1100 Last data filed at 06/11/2019 1056 Gross per 24 hour  Intake 3520.13 ml  Output 4690 ml  Net -1169.87 ml   Filed Weights   06/09/19 1154  Weight: (!) 226.8 kg     General:  Morbidly obese older gentleman, intubated and sedated HEENT: Normocephalic, atraumatic. Anicteric sclerae. Mucous membranes moist. Neck: Supple, no JVD appreciated. CV: RRR with no murmurs, rubs, or gallops. Heart sounds distant due to adiposity.  PULM:  Singular, loud, diffuse wheeze noted with each breath throughout chest. Decreased breath sounds bilateral bases GI: Normoactive bowel sounds, non-distended Extremities: No edema appreciated, warm and well-perfused Neuro: Responsive to sound and painful stimuli on propofol and fentanyl. Opens eyes to sound and nods head, but unable to follow commands. Moves all four limbs spontaneously. Pupils equal, round, and reactive to light. 1+ patellar and biceps reflexes bilaterally. Negative Babinski reflex. MSK: Normal muscle bulk and tone Skin: no rashes or lesions   Ancillary tests (personally reviewed)  CBC: Recent Labs  Lab 06/09/19 1158 06/09/19 1340  06/10/19 0407 06/10/19 0550 06/10/19 1543 06/11/19 0417 06/11/19 0449  WBC 8.8 9.5  --   --  9.5  --  9.9  --   NEUTROABS  --  6.6  --   --   --   --   --   --   HGB 15.3 14.8   < > 17.3* 15.1 15.6 14.1 14.6  HCT 54.3* 51.9   < > 51.0 49.9 46.0 47.1 43.0  MCV 97.8 97.9  --   --  91.6  --  91.8  --   PLT 205 197  --   --  182  --  191  --    < > = values in this interval not displayed.    Basic Metabolic Panel: Recent Labs  Lab 06/09/19 1158  06/10/19 0407 06/10/19 0550 06/10/19 1543 06/11/19 0417 06/11/19  0449  NA 140   < > 138 138 140 142 140  K 4.2   < > 3.9 4.2 3.6 3.7 3.3*  CL 95*  --   --  95*  --  98  --   CO2 37*  --   --  29  --  32  --   GLUCOSE 120*  --   --  117*  --  97  --   BUN 10  --   --  11  --  19  --   CREATININE 0.88  --   --  1.04  --  1.24  --   CALCIUM 8.5*  --   --  8.1*  --  7.7*  --   MG  --   --   --   --   --  1.7  --    < > = values in this interval not displayed.   GFR: Estimated  Creatinine Clearance: 123.9 mL/min (by C-G formula based on SCr of 1.24 mg/dL). Recent Labs  Lab 06/09/19 1158 06/09/19 1340 06/10/19 0550 06/11/19 0417  WBC 8.8 9.5 9.5 9.9    Liver Function Tests: Recent Labs  Lab 06/09/19 1158 06/11/19 0417  AST 19 22  ALT 15 18  ALKPHOS 69 50  BILITOT 0.5 1.1  PROT 8.2* 6.2*  ALBUMIN 3.3* 2.3*   No results for input(s): LIPASE, AMYLASE in the last 168 hours. No results for input(s): AMMONIA in the last 168 hours.  ABG    Component Value Date/Time   PHART 7.466 (H) 06/11/2019 0449   PCO2ART 50.8 (H) 06/11/2019 0449   PO2ART 72.0 (L) 06/11/2019 0449   HCO3 36.6 (H) 06/11/2019 0449   TCO2 38 (H) 06/11/2019 0449   O2SAT 95.0 06/11/2019 0449     Coagulation Profile: Recent Labs  Lab 06/09/19 1311  INR 1.0    Cardiac Enzymes: No results for input(s): CKTOTAL, CKMB, CKMBINDEX, TROPONINI in the last 168 hours.  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 Recommended for outpatient PFT's in 2018 but no records of  completion. Potential volume overload, though BNP low on admission. Echo completed on 9/10 showed normal LV fxn with EF 60-65% as well as moderate RV dysfunction w/ an enlarged ventricle. IVC is enlarged as well, with <50% respiratory variability. LE dopplers completed on 9/10 were negative for DVT but limited by body habitus. - Continue Duoneb Q6 and PRN -Continue empiric heparin -Continue vent on PC, wean as tolerated -On Fentanyl and Propofol gtt, wean as tolerated - 1x dose of Lasix 80 mg today, will redose tomorrow  HTN -holding home Lisinopril in setting of soft BPs  Depression/Concern for suicidal ideation-ED reported to ICU nursing that pt mentioned he "did not want to live like this anymore" -nursing requesting psych eval once pt is extubated  Hypokalemia- Likely secondary to Lasix administration - Replenish - Follow BMP   Best practice:  Diet: N.p.o. Pain/Anxiety/Delirium protocol (if indicated): Fentanyl and propofol infusion VAP protocol (if indicated): Vent bundle in place DVT prophylaxis: Full dose anticoagulant GI prophylaxis: Famotidine  Urinary catheter: Assessment of intravascular volume Glucose control: Sliding scale phase 1 Mobility: Bedrest Code Status: Full code Family Communication: Pt's wife updated Disposition: ICU   CRITICAL CARE Performed by: Orville GovernJonathan Mitra Duling, MS4    Total critical care time:

## 2019-06-11 NOTE — Progress Notes (Signed)
ANTICOAGULATION CONSULT NOTE   Pharmacy Consult for Heparin Indication: pulmonary embolus  No Known Allergies  Patient Measurements: Height: 6\' 2"  (188 cm) Weight: (bed scale broken) IBW/kg (Calculated) : 82.2 Heparin Dosing Weight: 146 kg  Vital Signs: Temp: 98.1 F (36.7 C) (09/11 1530) Temp Source: Oral (09/11 1530) BP: 94/53 (09/11 1530) Pulse Rate: 110 (09/11 1530)  Labs: Recent Labs    06/09/19 1158 06/09/19 1311 06/09/19 1326 06/09/19 1340 06/09/19 1534  06/10/19 0550 06/10/19 1358 06/10/19 1543 06/11/19 0417 06/11/19 0449 06/11/19 1559  HGB 15.3  --   --  14.8  --    < > 15.1  --  15.6 14.1 14.6  --   HCT 54.3*  --   --  51.9  --    < > 49.9  --  46.0 47.1 43.0  --   PLT 205  --   --  197  --   --  182  --   --  191  --   --   APTT  --  28  --   --   --   --   --   --   --   --   --   --   LABPROT  --  13.5  --   --   --   --   --   --   --   --   --   --   INR  --  1.0  --   --   --   --   --   --   --   --   --   --   HEPARINUNFRC  --   --   --   --   --    < > <0.10* 0.40  --  0.20*  --  0.26*  CREATININE 0.88  --   --   --   --   --  1.04  --   --  1.24  --   --   TROPONINIHS  --   --  4  --  4  --   --   --   --   --   --   --    < > = values in this interval not displayed.    Estimated Creatinine Clearance: 123.9 mL/min (by C-G formula based on SCr of 1.24 mg/dL).   Assessment: 63 yr old morbidly obese male admitted with acute shortness of breath requiring prompt intubation, with progressive respiratory failure of unclear cause; pharmacy consulted to dose/monitor heparin for possible PE. Dopplers negative for DVT.  Other medical history includes: HTN, OSA, chronic resp failure, heart failure, COPD  Heparin level remains subtherapeutic at 0.26 despite rate increase this AM  Goal of Therapy:  Heparin level 0.3-0.7 units/ml Monitor platelets by anticoagulation protocol: Yes   Plan:  Heparin 3000 units [employer] heparin to 2800  units/hr Re-check heparin level in 6 hours after rate change Daily heparin level, CBC  Vertis Kelch, PharmD PGY2 Cardiology Pharmacy Resident Phone [phone] 06/11/2019       4:53 PM  Please check [email or web address] for unit-specific pharmacist phone numbers

## 2019-06-11 NOTE — Progress Notes (Signed)
RT NOTE: RT called to room by RN as patient had self extubated. Upon arrival RN had placed patient on NRB mask with sats at 100%. Patient is alert, able to follow commands, and asking questions. MD notified and RT has bipap on standby in room. Vitals are stable. RT will continue to monitor.

## 2019-06-11 NOTE — Progress Notes (Signed)
ANTICOAGULATION CONSULT NOTE   Pharmacy Consult for Heparin Indication: pulmonary embolus  No Known Allergies  Patient Measurements: Height: 6\' 2"  (188 cm) Weight: (!) 500 lb (226.8 kg) IBW/kg (Calculated) : 82.2 Heparin Dosing Weight: 146 kg  Vital Signs: Temp: 98.6 F (37 C) (09/11 0405) Temp Source: Oral (09/11 0405) BP: 99/62 (09/11 0530) Pulse Rate: 77 (09/11 0530)  Labs: Recent Labs    06/09/19 1158 06/09/19 1311 06/09/19 1326 06/09/19 1340 06/09/19 1534  06/10/19 0550 06/10/19 1358 06/10/19 1543 06/11/19 0417 06/11/19 0449  HGB 15.3  --   --  14.8  --    < > 15.1  --  15.6 14.1 14.6  HCT 54.3*  --   --  51.9  --    < > 49.9  --  46.0 47.1 43.0  PLT 205  --   --  197  --   --  182  --   --  191  --   APTT  --  28  --   --   --   --   --   --   --   --   --   LABPROT  --  13.5  --   --   --   --   --   --   --   --   --   INR  --  1.0  --   --   --   --   --   --   --   --   --   HEPARINUNFRC  --   --   --   --   --   --  <0.10* 0.40  --  0.20*  --   CREATININE 0.88  --   --   --   --   --  1.04  --   --  1.24  --   TROPONINIHS  --   --  4  --  4  --   --   --   --   --   --    < > = values in this interval not displayed.    Estimated Creatinine Clearance: 123.9 mL/min (by C-G formula based on SCr of 1.24 mg/dL).   Assessment: 63 yr old morbidly obese male admitted with acute shortness of breath requiring prompt intubation, with progressive respiratory failure of unclear cause; pharmacy consulted to dose/monitor heparin for possible PE.  Other medical history includes: HTN, OSA, chronic resp failure, heart failure, COPD  9/11 AM update:  Heparin level sub-therapeutic this AM No issues per RN  Goal of Therapy:  Heparin level 0.3-0.7 units/ml Monitor platelets by anticoagulation protocol: Yes   Plan:  Heparin 3000 units [employer] heparin to 2500 units/hr Re-check heparin level at [place], PharmD, [place] Pharmacist Phone:  [phone]

## 2019-06-11 NOTE — Progress Notes (Signed)
eLink Physician-Brief Progress Note Patient Name: Jeffrey Skinner DOB: 09-24-1956 MRN: [id number]   Date of Service  06/11/2019  HPI/Events of Note  Hypotension with sedation  eICU Interventions  LR 500 ml iv fluid bolus x 1        Jaquasia Doscher U Morgaine Kimball 06/11/2019, 4:19 AM

## 2019-06-11 NOTE — Progress Notes (Addendum)
Jeffrey Skinner:  [place], MRN:  [id number], DOB:  Jun 10, 1956, LOS: 2 ADMISSION DATE:  06/09/2019, CONSULTATION DATE: 06/09/2019 REFERRING MD: [facility], CHIEF COMPLAINT: Acute respiratory failure  HPI/course in hospital  63 y.o. M with PMH of obesity and OSA who presented to the ED with worsening of his chronic SOB after doing some yard work.  He was initially satting well on room air, but had worsening respiratory failure in the ED that required Bipap and then intubation due to severe hypercarbic respiratory failure. History of admission for respiratory failure in 2018 where he was diagnosed with multi-factorial hypoxic respiratory failure likely secondary to undiagnosed COPD, OSA and obesity hypoventilation syndrome.  Has not been able to cope with weight loss for psychiatric reasons.  He was admitted to the ICU for ventilator support.  Past Medical History   Past Medical History:  Diagnosis Date  . Arthritis   . Depression   . Dyspnea   . Hypertension   . Sleep apnea      Past Surgical History:  Procedure Laterality Date  . APPENDECTOMY    . HERNIA REPAIR    . VASECTOMY  1998       Allergies No Known Allergies   Home Medications  Prior to Admission medications   Medication Sig Start Date End Date Taking? Authorizing Provider  amLODipine (NORVASC) 5 MG tablet Take 5 mg by mouth daily. 12/14/17  Yes [provider]  furosemide (LASIX) 80 MG tablet Take 80 mg by mouth daily. 12/14/17  Yes [provider]  ipratropium-albuterol (DUONEB) 0.5-2.5 (3) MG/3ML SOLN Take 3 mLs by nebulization every 6 (six) hours as needed for wheezing. 12/14/17  Yes [provider]  naproxen sodium (ALEVE) 220 MG tablet Take 440 mg by mouth daily as needed (pain).   Yes [provider]  albuterol (PROVENTIL HFA;VENTOLIN HFA) 108 (90 Base) MCG/ACT inhaler Inhale 2 puffs into the lungs every 6 (six) hours as needed for wheezing or shortness of breath. Patient not  taking: Reported on 03/04/2018 01/10/17   Reymundo PollGuilloud, Carolyn, MD  HYDROcodone-acetaminophen (NORCO) 5-325 MG tablet Take 1-2 tablets by mouth every 6 (six) hours as needed. Patient not taking: Reported on 06/09/2019 03/05/18   Geoffery Lyonselo, Douglas, MD  lisinopril (PRINIVIL,ZESTRIL) 10 MG tablet Take 1 tablet (10 mg total) by mouth daily. Patient not taking: Reported on 03/04/2018 01/11/17   Reymundo PollGuilloud, Carolyn, MD  Tiotropium Bromide Monohydrate (SPIRIVA RESPIMAT) 1.25 MCG/ACT AERS Inhale 2 puffs into the lungs daily. Patient not taking: Reported on 03/04/2018 01/10/17   Reymundo PollGuilloud, Carolyn, MD    Significant Hospital Events   9/9 Admitted to PCCM  Consults:    Procedures:  9/9 ETT  Significant Diagnostic Tests:  9/10 CXR>>stable bilateral opacities 9/10 Echo>>EF 60-65%, moderately reduced RV systolic function 9/10 LE dopplers, inconclusive due to poor visualization, but no obvious DVT  Micro Data:  9/9 MRSA screen>>neg 9/9 Sars-CoV-2>>neg  Antimicrobials:  Azithromycin 9/9 only Ceftriaxone 9/9 only   Interim history/subjective:  Hypotensive overnight, responded to IVF bolus, remains intubated and sedated  Objective   Blood pressure (!) 84/52, pulse 73, temperature 98.6 F (37 C), temperature source Oral, resp. rate (!) 24, height 6\' 2"  (1.88 m), weight (!) 226.8 kg, SpO2 96 %.    Vent Mode: PCV FiO2 (%):  [60 %-100 %] 60 % Set Rate:  [24 bmp-28 bmp] 24 bmp Vt Set:  [650 mL] 650 mL PEEP:  [8 cmH20-15 cmH20] 8 cmH20 Plateau Pressure:  [30 cmH20-40 cmH20] 40  cmH20   Intake/Output Summary (Last 24 hours) at 06/11/2019 0734 Last data filed at 06/11/2019 0646 Gross per 24 hour  Intake 2720.18 ml  Output 3475 ml  Net -754.82 ml   Filed Weights   06/09/19 1154  Weight: (!) 226.8 kg    General:  Obese M, no distress, sedated and intubated HEENT: MM pink/moist Neuro: withdraws to pain CV: s1s2 RRR, no m/r/g PULM:  Decreased breath sounds bilateral bases GI: soft, bsx4 active  Extremities:  warm/dry, 2+ edema  Skin: no rashes or lesions    Ancillary tests (personally reviewed)  CBC: Recent Labs  Lab 06/09/19 1158 06/09/19 1340  06/10/19 0407 06/10/19 0550 06/10/19 1543 06/11/19 0417 06/11/19 0449  WBC 8.8 9.5  --   --  9.5  --  9.9  --   NEUTROABS  --  6.6  --   --   --   --   --   --   HGB 15.3 14.8   < > 17.3* 15.1 15.6 14.1 14.6  HCT 54.3* 51.9   < > 51.0 49.9 46.0 47.1 43.0  MCV 97.8 97.9  --   --  91.6  --  91.8  --   PLT 205 197  --   --  182  --  191  --    < > = values in this interval not displayed.    Basic Metabolic Panel: Recent Labs  Lab 06/09/19 1158  06/10/19 0407 06/10/19 0550 06/10/19 1543 06/11/19 0417 06/11/19 0449  NA 140   < > 138 138 140 142 140  K 4.2   < > 3.9 4.2 3.6 3.7 3.3*  CL 95*  --   --  95*  --  98  --   CO2 37*  --   --  29  --  32  --   GLUCOSE 120*  --   --  117*  --  97  --   BUN 10  --   --  11  --  19  --   CREATININE 0.88  --   --  1.04  --  1.24  --   CALCIUM 8.5*  --   --  8.1*  --  7.7*  --   MG  --   --   --   --   --  1.7  --    < > = values in this interval not displayed.   GFR: Estimated Creatinine Clearance: 123.9 mL/min (by C-G formula based on SCr of 1.24 mg/dL). Recent Labs  Lab 06/09/19 1158 06/09/19 1340 06/10/19 0550 06/11/19 0417  WBC 8.8 9.5 9.5 9.9    Liver Function Tests: Recent Labs  Lab 06/09/19 1158 06/11/19 0417  AST 19 22  ALT 15 18  ALKPHOS 69 50  BILITOT 0.5 1.1  PROT 8.2* 6.2*  ALBUMIN 3.3* 2.3*   No results for input(s): LIPASE, AMYLASE in the last 168 hours. No results for input(s): AMMONIA in the last 168 hours.  ABG    Component Value Date/Time   PHART 7.466 (H) 06/11/2019 0449   PCO2ART 50.8 (H) 06/11/2019 0449   PO2ART 72.0 (L) 06/11/2019 0449   HCO3 36.6 (H) 06/11/2019 0449   TCO2 38 (H) 06/11/2019 0449   O2SAT 95.0 06/11/2019 0449     Coagulation Profile: Recent Labs  Lab 06/09/19 1311  INR 1.0    Cardiac Enzymes: No results for input(s):  CKTOTAL, CKMB, CKMBINDEX, TROPONINI in the last 168 hours.  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 -Wife notes he had PFT's done at the [facility] where he follows, did not show obstructive lung disease -Possibly an element of volume overload, though BNP low received Lasix 80mg  on 9/9 with ~1L UOP  -started empirically on Heparin for possible PE -No evidence of cardiac etiology, no obvious DVT on doppler P: -Continue Full Vent support with goal of weaning sedation, requiring less FiO2 and PEEP today  consider CT chest if able to accommodate body habitus -Continue empiric heparin -On Fentanyl and Propofol gtt, wean for SAT -Increase Lasix to 80mg  bid,out 3L urine yesterday, continue duonebs    HTN -holding home Lisinopril     Depression/Concern for suicidal ideation -ED reported to ICU nursing that pt mentioned he "did not want to live like this anymore" P: -nursing requesting psych eval once pt is extubated     Best practice:  Diet: N.p.o. Pain/Anxiety/Delirium protocol (if indicated): Fentanyl and propofol infusion VAP protocol (if indicated): Vent bundle in place DVT prophylaxis: Full dose anticoagulant GI prophylaxis: Famotidine  Urinary catheter: Assessment of intravascular volume Glucose control: Sliding scale phase 1 Mobility: Bedrest Code Status: Full code Family Communication: Pt's wife updated Disposition: ICU   CRITICAL CARE Performed by: Otilio Carpen Ernest Popowski    Total critical care time: 35 minutes  Critical care time was exclusive of separately billable procedures and treating other patients.  Critical care was necessary to treat or prevent imminent or life-threatening deterioration secondary to acute respiratory failure requiring ventilator management.  Critical care was time spent personally by me on the following activities: development of treatment plan with patient and/or surrogate as well as nursing, discussions with consultants, evaluation of patient's response to treatment, examination of patient, obtaining history from patient or surrogate, ordering and performing treatments and interventions, ordering and review of laboratory studies, ordering and review of radiographic studies, pulse oximetry and re-evaluation of patient's condition.    Otilio Carpen Bonnie Roig, PA-C [place] [facility]  Pager# [phone], if no answer [phone]

## 2019-06-11 NOTE — Progress Notes (Signed)
Pt continues to buck vent, calm but dyssynchronous with vent. RT to bedside to assess.   Called eICU team, spoke with [facility] RN and discussed concerns.   Diprivan gtt is at 30 mcg/kg/min. Will give Fentanyl 50 mcg IV bolus and increase drip to 125 mcg/hr to attempt appropriate comfort/sedation/synchrony with vent. BP stable now but team aware of SBP high 80s earlier.

## 2019-06-11 NOTE — Progress Notes (Signed)
Remains on vent, difficulty with appropriate sedation as pt rests/calm if no care being give but frequently becomes restless/bites on ETT when care being give, RT managing vent/pressure alarms which increase when pt not properly sedated. BP soft, titrating diprivan gtt now @ 25 mcg/kg/min and fentanyl gtt @ 125 mcg/hr.   Mitts remain on for safety. Reassurance and reorientation provided.

## 2019-06-11 NOTE — Progress Notes (Signed)
RT obtained ABG on pt with the following results. No changes at this time. RT will continue to monitor.   Results for VILAS, EDGERLY (MRN [id number]) as of 06/11/2019 05:19  Ref. Range 06/11/2019 04:49  Sample type Unknown ARTERIAL  pH, Arterial Latest Ref Range: 7.350 - 7.450  7.466 (H)  pCO2 arterial Latest Ref Range: 32.0 - 48.0 mmHg 50.8 (H)  pO2, Arterial Latest Ref Range: 83.0 - 108.0 mmHg 72.0 (L)  TCO2 Latest Ref Range: 22 - 32 mmol/L 38 (H)  Acid-Base Excess Latest Ref Range: 0.0 - 2.0 mmol/L 11.0 (H)  Bicarbonate Latest Ref Range: 20.0 - 28.0 mmol/L 36.6 (H)  O2 Saturation Latest Units: % 95.0  Patient temperature Unknown 98.6 F  Collection site Unknown RADIAL, ALLEN'S TEST ACCEPTABLE

## 2019-06-11 NOTE — Progress Notes (Signed)
Resting well on vent with Diprivan gt @ 30 mcg/kg/min and Fentanyl gtt @ 125 mcg/hr, no longer dyssynchronous with vent. BP stable after LR bolus.

## 2019-06-11 NOTE — Plan of Care (Signed)
  Problem: Clinical Measurements: Goal: Ability to maintain clinical measurements within normal limits will improve Outcome: Progressing Goal: Will remain free from infection Outcome: Progressing Goal: Diagnostic test results will improve Outcome: Progressing Goal: Cardiovascular complication will be avoided Outcome: Progressing   Problem: Nutrition: Goal: Adequate nutrition will be maintained Outcome: Progressing   Problem: Coping: Goal: Level of anxiety will decrease Outcome: Progressing   Problem: Elimination: Goal: Will not experience complications related to bowel motility Outcome: Progressing Goal: Will not experience complications related to urinary retention Outcome: Progressing   Problem: Pain Managment: Goal: General experience of comfort will improve Outcome: Progressing   Problem: Safety: Goal: Ability to remain free from injury will improve Outcome: Progressing   Problem: Skin Integrity: Goal: Risk for impaired skin integrity will decrease Outcome: Progressing   

## 2019-06-11 NOTE — Progress Notes (Signed)
140 ml of Fentanyl wasted with Pauline Good, RN at this time.

## 2019-06-12 LAB — BASIC METABOLIC PANEL
Anion gap: 11 (ref 5–15)
BUN: 18 mg/dL (ref 8–23)
CO2: 31 mmol/L (ref 22–32)
Calcium: 7.7 mg/dL — ABNORMAL LOW (ref 8.9–10.3)
Chloride: 100 mmol/L (ref 98–111)
Creatinine, Ser: 1.05 mg/dL (ref 0.61–1.24)
GFR calc Af Amer: >60 mL/min (ref >60)
GFR calc non Af Amer: >60 mL/min (ref >60)
Glucose, Bld: 97 mg/dL (ref 70–99)
Potassium: 3.8 mmol/L (ref 3.5–5.1)
Sodium: 142 mmol/L (ref 135–145)

## 2019-06-12 LAB — CBC
HCT: 45.7 % (ref 39.0–52.0)
Hemoglobin: 13.4 g/dL (ref 13.0–17.0)
MCH: 27.1 pg (ref 26.0–34.0)
MCHC: 29.3 g/dL — ABNORMAL LOW (ref 30.0–36.0)
MCV: 92.3 fL (ref 80.0–100.0)
Platelets: 188 10*3/uL (ref 150–400)
RBC: 4.95 MIL/uL (ref 4.22–5.81)
RDW: 16.2 % — ABNORMAL HIGH (ref 11.5–15.5)
WBC: 10.5 10*3/uL (ref 4.0–10.5)
nRBC: 0 % (ref 0.0–0.2)

## 2019-06-12 LAB — CULTURE, RESPIRATORY W GRAM STAIN: Culture: NORMAL

## 2019-06-12 LAB — TRIGLYCERIDES: Triglycerides: 69 mg/dL (ref <150)

## 2019-06-12 LAB — HEPARIN LEVEL (UNFRACTIONATED): Heparin Unfractionated: 0.48 [IU]/mL (ref 0.30–0.70)

## 2019-06-12 MED ORDER — IPRATROPIUM-ALBUTEROL 0.5-2.5 (3) MG/3ML IN SOLN: 3.0000 mL | Freq: Four times a day (QID) | RESPIRATORY_TRACT | Status: DC | PRN

## 2019-06-12 MED ORDER — ORAL CARE MOUTH RINSE
15.0000 mL | Freq: Two times a day (BID) | OROMUCOSAL | Status: DC
  Administered 2019-06-12 – 2019-06-18 (×12): 15 mL via OROMUCOSAL

## 2019-06-12 MED ORDER — FUROSEMIDE 10 MG/ML IJ SOLN
40.0000 mg | Freq: Two times a day (BID) | INTRAMUSCULAR | Status: DC
  Administered 2019-06-12 – 2019-06-13 (×4): 40 mg via INTRAVENOUS
  Filled 2019-06-12 (×4): qty 4

## 2019-06-12 MED ORDER — AMLODIPINE BESYLATE 5 MG PO TABS
5.0000 mg | ORAL_TABLET | Freq: Every day | ORAL | Status: DC
  Administered 2019-06-12 – 2019-06-19 (×8): 5 mg via ORAL
  Filled 2019-06-12 (×8): qty 1

## 2019-06-12 MED ORDER — CHLORHEXIDINE GLUCONATE CLOTH 2 % EX PADS
6.0000 | MEDICATED_PAD | Freq: Every day | CUTANEOUS | Status: DC
  Administered 2019-06-13 – 2019-06-15 (×3): 6 via TOPICAL

## 2019-06-12 NOTE — Progress Notes (Signed)
ANTICOAGULATION CONSULT NOTE   Pharmacy Consult for Heparin Indication: pulmonary embolus  No Known Allergies  Patient Measurements: Height: 6\' 2"  (188 cm) Weight: (bed scale broken) IBW/kg (Calculated) : 82.2 Heparin Dosing Weight: 146 kg  Vital Signs: Temp: 98.1 F (36.7 C) (09/11 2334) Temp Source: Oral (09/11 2334) BP: 116/60 (09/12 0248) Pulse Rate: 85 (09/12 0248)  Labs: Recent Labs    06/09/19 1311 06/09/19 1326  06/09/19 1534  06/10/19 0550  06/11/19 0417 06/11/19 0449 06/11/19 1559 06/12/19 0013  HGB  --   --    < >  --    < > 15.1   < > 14.1 14.6  --  13.4  HCT  --   --    < >  --    < > 49.9   < > 47.1 43.0  --  45.7  PLT  --   --    < >  --   --  182  --  191  --   --  188  APTT 28  --   --   --   --   --   --   --   --   --   --   LABPROT 13.5  --   --   --   --   --   --   --   --   --   --   INR 1.0  --   --   --   --   --   --   --   --   --   --   HEPARINUNFRC  --   --   --   --   --  <0.10*   < > 0.20*  --  0.26* 0.48  CREATININE  --   --   --   --   --  1.04  --  1.24  --   --  1.05  TROPONINIHS  --  4  --  4  --   --   --   --   --   --   --    < > = values in this interval not displayed.    Estimated Creatinine Clearance: 146.3 mL/min (by C-G formula based on SCr of 1.05 mg/dL).   Assessment: .63 y.o. male with PE for heparin  Goal of Therapy:  Heparin level 0.3-0.7 units/ml Monitor platelets by anticoagulation protocol: Yes   Plan:  Continue Heparin at current rate    Phillis Knack, PharmD, BCPS

## 2019-06-12 NOTE — Progress Notes (Signed)
RT placed pt on Servo I in NIV PCV (BIPAP) mode 20/5, rate of 20 and 40% FIO2. Pt tolerating well, respiratory status is stable at this time. RT will continue to monitor.

## 2019-06-12 NOTE — Plan of Care (Signed)

## 2019-06-12 NOTE — Progress Notes (Signed)
Pt calm, cooperative, interactive throughout the shift. Sitter here d/t concerns about pt verbalizing not wanting to continue living like this while in the ED. Discussed with pt/explained role of sitter. Pt expresses no desire for self harm but agreeable to sitter until evaluated by MD.

## 2019-06-12 NOTE — Progress Notes (Signed)
Jeffrey Skinner, MRN:  [id number], DOB:  1955/12/24, LOS: 3 ADMISSION DATE:  06/09/2019, CONSULTATION DATE: 06/09/2019 REFERRING MD: Freida Busman -[facility], CHIEF COMPLAINT: Acute respiratory failure  HPI/course in hospital  63 y.o. M with PMH of obesity and OSA who presented to the ED with worsening of his chronic SOB after doing some yard work.  He was initially satting well on room air, but had worsening respiratory failure in the ED that required Bipap and then intubation due to severe hypercarbic respiratory failure. History of admission for respiratory failure in 2018 where he was diagnosed with multi-factorial hypoxic respiratory failure likely secondary to undiagnosed COPD, OSA and obesity hypoventilation syndrome.  Has not been able to cope with weight loss for psychiatric reasons.  He was admitted to the ICU for ventilator support.    Significant Hospital Events   9/9 Admitted to PCCM  Consults:    Procedures:  9/9 ETT >> 9/11  Significant Diagnostic Tests:  9/10 CXR>>stable bilateral opacities 9/10 Echo>>EF 60-65%, moderately reduced RV systolic function 9/10 LE dopplers, inconclusive due to poor visualization, but no obvious DVT  Micro Data:  9/9 MRSA screen>>neg 9/9 Sars-CoV-2>>neg  Antimicrobials:  Azithromycin 9/9 only Ceftriaxone 9/9 only   Interim history/subjective:  Self extubated 9/11, tolerated. He did not wear BiPAP reliably overnight last night, only for about 30 minutes. Remains on heparin infusion  Objective   Blood pressure 122/83, pulse 79, temperature 98.3 F (36.8 C), temperature source Oral, resp. rate 18, height 6\' 2"  (1.88 m), weight (!) 226.8 kg, SpO2 93 %.    Vent Mode: Other (Comment);PCV FiO2 (%):  [40 %-60 %] 40 % Set Rate:  [20 bmp] 20 bmp PEEP:  [5 cmH20-8 cmH20] 5 cmH20 Plateau Pressure:  [28 cmH20-31 cmH20] 28 cmH20   Intake/Output Summary (Last 24 hours) at 06/12/2019 1116 Last data filed at 06/12/2019 1100 Gross per 24 hour   Intake 1424.47 ml  Output 1700 ml  Net -275.53 ml   Filed Weights   06/09/19 1154  Weight: (!) 226.8 kg    General: Obese man, comfortable, sitting up in bed on oxygen HEENT: Oropharynx clear, voice strong, no stridor Neuro: Awake, interacting, follows commands and answers questions appropriately.  His memory of the events surrounding his hospitalization remains clouded CV: Distant regular, no murmur PULM: Very distant, decreased at both bases, no wheeze, no crackles GI: Massively obese, soft, nondistended, positive bowel sounds Extremities: 2+ bilateral pretibial edema Skin: No rash    Ancillary tests (personally reviewed)  CBC: Recent Labs  Lab 06/09/19 1158 06/09/19 1340  06/10/19 0550 06/10/19 1543 06/11/19 0417 06/11/19 0449 06/12/19 0013  WBC 8.8 9.5  --  9.5  --  9.9  --  10.5  NEUTROABS  --  6.6  --   --   --   --   --   --   HGB 15.3 14.8   < > 15.1 15.6 14.1 14.6 13.4  HCT 54.3* 51.9   < > 49.9 46.0 47.1 43.0 45.7  MCV 97.8 97.9  --  91.6  --  91.8  --  92.3  PLT 205 197  --  182  --  191  --  188   < > = values in this interval not displayed.    Basic Metabolic Panel: Recent Labs  Lab 06/09/19 1158  06/10/19 0550 06/10/19 1543 06/11/19 0417 06/11/19 0449 06/12/19 0013  NA 140   < > 138 140 142 140 142  K 4.2   < >  4.2 3.6 3.7 3.3* 3.8  CL 95*  --  95*  --  98  --  100  CO2 37*  --  29  --  32  --  31  GLUCOSE 120*  --  117*  --  97  --  97  BUN 10  --  11  --  19  --  18  CREATININE 0.88  --  1.04  --  1.24  --  1.05  CALCIUM 8.5*  --  8.1*  --  7.7*  --  7.7*  MG  --   --   --   --  1.7  --   --    < > = values in this interval not displayed.   GFR: Estimated Creatinine Clearance: 146.3 mL/min (by C-G formula based on SCr of 1.05 mg/dL). Recent Labs  Lab 06/09/19 1340 06/10/19 0550 06/11/19 0417 06/12/19 0013  WBC 9.5 9.5 9.9 10.5    Liver Function Tests: Recent Labs  Lab 06/09/19 1158 06/11/19 0417  AST 19 22  ALT 15 18   ALKPHOS 69 50  BILITOT 0.5 1.1  PROT 8.2* 6.2*  ALBUMIN 3.3* 2.3*   No results for input(s): LIPASE, AMYLASE in the last 168 hours. No results for input(s): AMMONIA in the last 168 hours.  ABG    Component Value Date/Time   PHART 7.466 (H) 06/11/2019 0449   PCO2ART 50.8 (H) 06/11/2019 0449   PO2ART 72.0 (L) 06/11/2019 0449   HCO3 36.6 (H) 06/11/2019 0449   TCO2 38 (H) 06/11/2019 0449   O2SAT 95.0 06/11/2019 0449     Coagulation Profile: Recent Labs  Lab 06/09/19 1311  INR 1.0    Cardiac Enzymes: No results for input(s): CKTOTAL, CKMB, CKMBINDEX, TROPONINI in the last 168 hours.  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  Suspect coexisting COPD although apparently not supported [facility] -Suspect total body volume overload due to decompensated secondary pulmonary hypertension, cor pulmonale.  Associated right pleural effusion P: -He needs BiPAP nightly and when napping.  He apparently has good BiPAP compliance at home. -Push PT, up to chair -Continue slow steady diuresis as he can tolerate.,  Currently net -1 L.  Lasix 40 mg twice daily (twice his home dose) -Minimize sedating medications -Currently on empiric heparin given the possibility of pulmonary embolism at presentation.  Lower extremity Dopplers were limited by body habitus but were negative for DVT.  I think we can stop the heparin 9/12 and follow  Mild acute renal failure -Follow  BMP, urine output and adjust diuretics as indicated/tolerated -Hold off on adding back lisinopril for now  HTN -holding home Lisinopril -add back home amlodipine  Depression/Concern for suicidal ideation at presentation -ED reported to ICU nursing that pt mentioned he "did  not want to live like this anymore" P: -Discussed this with him today.  He denies any suicidal ideation or intention to harm himself.  I will discontinue the safety sitter  Dispo >> He can likely move out to floor bed on 9/13 if he tolerates and remains stable on BiPAP overnight tonight   Baltazar Apo, MD, PhD 06/12/2019, 11:27 AM [facility] [phone] or if no answer [phone]

## 2019-06-13 DIAGNOSIS — J9602 Acute respiratory failure with hypercapnia: Secondary | ICD-10-CM

## 2019-06-13 LAB — CBC
HCT: 45 % (ref 39.0–52.0)
Hemoglobin: 13.1 g/dL (ref 13.0–17.0)
MCH: 27.5 pg (ref 26.0–34.0)
MCHC: 29.1 g/dL — ABNORMAL LOW (ref 30.0–36.0)
MCV: 94.3 fL (ref 80.0–100.0)
Platelets: 183 10*3/uL (ref 150–400)
RBC: 4.77 MIL/uL (ref 4.22–5.81)
RDW: 15.9 % — ABNORMAL HIGH (ref 11.5–15.5)
WBC: 8.4 10*3/uL (ref 4.0–10.5)
nRBC: 0 % (ref 0.0–0.2)

## 2019-06-13 LAB — MAGNESIUM: Magnesium: 2 mg/dL (ref 1.7–2.4)

## 2019-06-13 LAB — BASIC METABOLIC PANEL
Anion gap: 9 (ref 5–15)
BUN: 12 mg/dL (ref 8–23)
CO2: 34 mmol/L — ABNORMAL HIGH (ref 22–32)
Calcium: 7.9 mg/dL — ABNORMAL LOW (ref 8.9–10.3)
Chloride: 98 mmol/L (ref 98–111)
Creatinine, Ser: 0.83 mg/dL (ref 0.61–1.24)
GFR calc Af Amer: >60 mL/min (ref >60)
GFR calc non Af Amer: >60 mL/min (ref >60)
Glucose, Bld: 101 mg/dL — ABNORMAL HIGH (ref 70–99)
Potassium: 4.2 mmol/L (ref 3.5–5.1)
Sodium: 141 mmol/L (ref 135–145)

## 2019-06-13 LAB — BRAIN NATRIURETIC PEPTIDE: B Natriuretic Peptide: 26.4 pg/mL (ref 0.0–100.0)

## 2019-06-13 MED ORDER — FUROSEMIDE 10 MG/ML IJ SOLN
20.0000 mg | Freq: Two times a day (BID) | INTRAMUSCULAR | Status: DC
  Administered 2019-06-14 – 2019-06-15 (×4): 20 mg via INTRAVENOUS
  Filled 2019-06-13 (×4): qty 2

## 2019-06-13 NOTE — Evaluation (Signed)
Physical Therapy Evaluation Patient Details Name: Jeffrey Skinner S Belles MRN: [id number] DOB: Oct 11, 1955 Today's Date: 06/13/2019   History of Present Illness  Pt adm with Acute on chronic hypercapnic and hypoxic respiratory failure and intubated 9/9-9/11. PMH - morbid obesity, HTN, OSA/OHS and chronic respiratory failure   Clinical Impression  Pt presents to PT with decr mobility due to illness/inactivity. Needs skilled PT to maximize independence. Hopeful that pt will progress quickly with mobility and be able to return home with family. May initially need some incr support at home from family.     Follow Up Recommendations Home health PT;Supervision for mobility/OOB    Equipment Recommendations  Rolling walker with 5" wheels(bariatric)    Recommendations for Other Services       Precautions / Restrictions Precautions Precautions: Fall Restrictions Weight Bearing Restrictions: No      Mobility  Bed Mobility Overal bed mobility: Needs Assistance Bed Mobility: Supine to Sit     Supine to sit: Min assist;HOB elevated;+2 for safety/equipment     General bed mobility comments: Assist to elevate trunk into sitting. Incr time and effort  Transfers Overall transfer level: Needs assistance Equipment used: Rolling walker (2 wheeled) Transfers: Sit to/from Stand Sit to Stand: Min guard;+2 safety/equipment;From elevated surface         General transfer comment: Assist for safety and lines.  Ambulation/Gait Ambulation/Gait assistance: Min guard;+2 safety/equipment Gait Distance (Feet): 25 Feet Assistive device: Rolling walker (2 wheeled) Gait Pattern/deviations: Step-through pattern;Decreased step length - right;Decreased step length - left;Trunk flexed;Wide base of support Gait velocity: decr Gait velocity interpretation: <1.31 ft/sec, indicative of household ambulator General Gait Details: Assist for safety and lines. Labored gait but no loss of balance or instability using  walker. Pt on 2L of O2 with VSS. Dyspnea 2/4  Stairs            Wheelchair Mobility    Modified Rankin (Stroke Patients Only)       Balance Overall balance assessment: No apparent balance deficits (not formally assessed)                                           Pertinent Vitals/Pain Pain Assessment: No/denies pain    Home Living Family/patient expects to be discharged to:: Private residence Living Arrangements: Spouse/significant other;Children Available Help at Discharge: Family;Available PRN/intermittently Type of Home: House Home Access: Ramped entrance     Home Layout: Two level;Laundry or work area in basement;Able to live on main level with Pilgrim's Pridebedroom/bathroom Home Equipment: None      Prior Function Level of Independence: Needs assistance   Gait / Transfers Assistance Needed: independent without assistive device     Comments: Pt owns barbeque business. States he mainly sits     Hand Dominance        Extremity/Trunk Assessment   Upper Extremity Assessment Upper Extremity Assessment: Defer to OT evaluation    Lower Extremity Assessment Lower Extremity Assessment: Generalized weakness       Communication   Communication: No difficulties  Cognition Arousal/Alertness: Awake/alert Behavior During Therapy: WFL for tasks assessed/performed Overall Cognitive Status: Within Functional Limits for tasks assessed                                        General Comments  Exercises     Assessment/Plan    PT Assessment Patient needs continued PT services  PT Problem List Decreased strength;Decreased activity tolerance;Decreased mobility;Obesity       PT Treatment Interventions DME instruction;Gait training;Functional mobility training;Therapeutic activities;Therapeutic exercise;Patient/family education    PT Goals (Current goals can be found in the Care Plan section)  Acute Rehab PT Goals Patient Stated  Goal: get better PT Goal Formulation: With patient Time For Goal Achievement: 06/27/19 Potential to Achieve Goals: Good    Frequency Min 3X/week   Barriers to discharge Decreased caregiver support Intermittent assist at home    Co-evaluation               AM-PAC PT "6 Clicks" Mobility  Outcome Measure Help needed turning from your back to your side while in a flat bed without using bedrails?: A Little Help needed moving from lying on your back to sitting on the side of a flat bed without using bedrails?: A Little Help needed moving to and from a bed to a chair (including a wheelchair)?: A Little Help needed standing up from a chair using your arms (e.g., wheelchair or bedside chair)?: A Little Help needed to walk in hospital room?: A Little Help needed climbing 3-5 steps with a railing? : Total 6 Click Score: 16    End of Session Equipment Utilized During Treatment: Oxygen Activity Tolerance: Patient limited by fatigue Patient left: in chair;with call bell/phone within reach Nurse Communication: Mobility status(nurse helped with mobility) PT Visit Diagnosis: Other abnormalities of gait and mobility (R26.89);Muscle weakness (generalized) (M62.81)    Time: 6195-0932 PT Time Calculation (min) (ACUTE ONLY): 37 min   Charges:   PT Evaluation $PT Eval Moderate Complexity: 1 Mod PT Treatments $Gait Training: 8-22 mins        [place] Pager [phone] Office [place] 06/13/2019, 11:30 AM

## 2019-06-13 NOTE — Progress Notes (Signed)
Jeffrey Skinner:  Jeffrey Skinner, MRN:  [id number], DOB:  [birth year], LOS: 4 ADMISSION DATE:  06/09/2019, CONSULTATION DATE: 06/09/2019 REFERRING MD: [facility] CHIEF COMPLAINT: Acute respiratory failure  HPI/course in hospital  63 y.o. M with PMH of obesity and OSA who presented to the ED with worsening of his chronic SOB after doing some yard work.  He was initially satting well on room air, but had worsening respiratory failure in the ED that required Bipap and then intubation due to severe hypercarbic respiratory failure. History of admission for respiratory failure in 2018 where he was diagnosed with multi-factorial hypoxic respiratory failure likely secondary to undiagnosed COPD, OSA and obesity hypoventilation syndrome.  Has not been able to cope with weight loss for psychiatric reasons.  He was admitted to the ICU for ventilator support.    Significant Hospital Events   9/9 Admitted to PCCM  Consults:    Procedures:  9/9 ETT >> 9/11  Significant Diagnostic Tests:  9/10 CXR>>stable bilateral opacities 9/10 Echo>>EF 60-65%, moderately reduced RV systolic function 9/10 LE dopplers, inconclusive due to poor visualization, but no obvious DVT  Micro Data:  9/9 MRSA screen>>neg 9/9 Sars-CoV-2>>neg  Antimicrobials:  Azithromycin 9/9 only Ceftriaxone 9/9 only   Interim history/subjective:  Self extubated 9/11, tolerated. He did not wear BiPAP reliably overnight last night, only for about 30 minutes.  -3L this am (x 24 hrs) Norvasc, lasix 40 bid, heparin held  Objective   Blood pressure (!) 146/92, pulse 89, temperature 98.4 F (36.9 C), temperature source Oral, resp. rate (!) 47, height 6\' 4"  (1.93 m), weight (!) 241.1 kg, SpO2 95 %.    Vent Mode: BIPAP FiO2 (%):  [40 %] 40 % Set Rate:  [20 bmp] 20 bmp PEEP:  [5 cmH20] 5 cmH20   Intake/Output Summary (Last 24 hours) at 06/13/2019 1055 Last data filed at 06/13/2019 0800 Gross per 24 hour  Intake 342.81 ml  Output 3595 ml   Net -3252.19 ml   Filed Weights   06/12/19 2100 06/13/19 0300  Weight: (!) 241.1 kg (!) 241.1 kg    General: Obese man, comfortable, sitting up in bed on oxygen HEENT: Oropharynx clear, voice strong, no stridor Neuro: Awake, interacting, follows commands and answers questions appropriately.  His memory of the events surrounding his hospitalization remains clouded CV: Distant regular, no murmur PULM: Very distant, decreased at both bases, no wheeze, no crackles GI: Massively obese, soft, nondistended, positive bowel sounds Extremities: 2+ bilateral pretibial edema Skin: No rash    Ancillary tests (personally reviewed)  CBC: Recent Labs  Lab 06/09/19 1340  06/10/19 0550 06/10/19 1543 06/11/19 0417 06/11/19 0449 06/12/19 0013 06/13/19 0235  WBC 9.5  --  9.5  --  9.9  --  10.5 8.4  NEUTROABS 6.6  --   --   --   --   --   --   --   HGB 14.8   < > 15.1 15.6 14.1 14.6 13.4 13.1  HCT 51.9   < > 49.9 46.0 47.1 43.0 45.7 45.0  MCV 97.9  --  91.6  --  91.8  --  92.3 94.3  PLT 197  --  182  --  191  --  188 183   < > = values in this interval not displayed.    Basic Metabolic Panel: Recent Labs  Lab 06/09/19 1158  06/10/19 0550 06/10/19 1543 06/11/19 0417 06/11/19 0449 06/12/19 0013 06/13/19 0235  NA 140   < > 138  140 142 140 142 141  K 4.2   < > 4.2 3.6 3.7 3.3* 3.8 4.2  CL 95*  --  95*  --  98  --  100 98  CO2 37*  --  29  --  32  --  31 34*  GLUCOSE 120*  --  117*  --  97  --  97 101*  BUN 10  --  11  --  19  --  18 12  CREATININE 0.88  --  1.04  --  1.24  --  1.05 0.83  CALCIUM 8.5*  --  8.1*  --  7.7*  --  7.7* 7.9*  MG  --   --   --   --  1.7  --   --  2.0   < > = values in this interval not displayed.   GFR: Estimated Creatinine Clearance: 196.3 mL/min (by C-G formula based on SCr of 0.83 mg/dL). Recent Labs  Lab 06/10/19 0550 06/11/19 0417 06/12/19 0013 06/13/19 0235  WBC 9.5 9.9 10.5 8.4    Liver Function Tests: Recent Labs  Lab 06/09/19 1158  06/11/19 0417  AST 19 22  ALT 15 18  ALKPHOS 69 50  BILITOT 0.5 1.1  PROT 8.2* 6.2*  ALBUMIN 3.3* 2.3*   No results for input(s): LIPASE, AMYLASE in the last 168 hours. No results for input(s): AMMONIA in the last 168 hours.  ABG    Component Value Date/Time   PHART 7.466 (H) 06/11/2019 0449   PCO2ART 50.8 (H) 06/11/2019 0449   PO2ART 72.0 (L) 06/11/2019 0449   HCO3 36.6 (H) 06/11/2019 0449   TCO2 38 (H) 06/11/2019 0449   O2SAT 95.0 06/11/2019 0449     Coagulation Profile: Recent Labs  Lab 06/09/19 1311  INR 1.0    Cardiac Enzymes: No results for input(s): CKTOTAL, CKMB, CKMBINDEX, TROPONINI in the last 168 hours.  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  Suspect coexisting COPD although apparently not supported when he had PFT done at the [place] total body volume overload due to decompensated secondary pulmonary hypertension, cor pulmonale.  Associated right pleural effusion P: -He needs BiPAP nightly and when napping.  He apparently has good BiPAP compliance at home. -Push PT, up to chair -Continue slow steady diuresis as he can tolerate.,  Currently net -3 L.   Decrease lasix today to home dose.  -Minimize sedating medications -empiric heparin for possible PE, held yesterday.   Mild acute renal failure Improved today.  -Follow BMP, urine output and adjust diuretics as indicated/tolerated -Hold off on adding back lisinopril for now - consider  tomorrow.   HTN -holding home Lisinopril -add back home amlodipine  Depression/Concern for suicidal ideation at presentation -ED reported to ICU nursing that pt mentioned he "did not want to  live like this anymore" P: Dr. Delton Coombes discussed yesterday.  He denies any suicidal ideation or intention to harm himself.   Dispo >> Transfer out of ICU to tele.    Total critical care time 35 min

## 2019-06-14 ENCOUNTER — Encounter (HOSPITAL_COMMUNITY): Payer: Self-pay | Admitting: Internal Medicine

## 2019-06-14 DIAGNOSIS — I5031 Acute diastolic (congestive) heart failure: Secondary | ICD-10-CM

## 2019-06-14 HISTORY — DX: Acute diastolic (congestive) heart failure: I50.31

## 2019-06-14 LAB — CBC
HCT: 47.2 % (ref 39.0–52.0)
Hemoglobin: 13.6 g/dL (ref 13.0–17.0)
MCH: 27.6 pg (ref 26.0–34.0)
MCHC: 28.8 g/dL — ABNORMAL LOW (ref 30.0–36.0)
MCV: 95.9 fL (ref 80.0–100.0)
Platelets: 192 10*3/uL (ref 150–400)
RBC: 4.92 MIL/uL (ref 4.22–5.81)
RDW: 15.9 % — ABNORMAL HIGH (ref 11.5–15.5)
WBC: 8.5 10*3/uL (ref 4.0–10.5)
nRBC: 0 % (ref 0.0–0.2)

## 2019-06-14 MED ORDER — PRO-STAT SUGAR FREE PO LIQD
30.0000 mL | Freq: Two times a day (BID) | ORAL | Status: DC
  Administered 2019-06-15 – 2019-06-17 (×3): 30 mL via ORAL
  Filled 2019-06-14 (×7): qty 30

## 2019-06-14 MED ORDER — HEPARIN SODIUM (PORCINE) 5000 UNIT/ML IJ SOLN
5000.0000 [IU] | Freq: Three times a day (TID) | INTRAMUSCULAR | Status: DC
  Administered 2019-06-14 – 2019-06-19 (×15): 5000 [IU] via SUBCUTANEOUS
  Filled 2019-06-14 (×16): qty 1

## 2019-06-14 MED ORDER — ADULT MULTIVITAMIN W/MINERALS CH
1.0000 | ORAL_TABLET | Freq: Every day | ORAL | Status: DC
  Administered 2019-06-14 – 2019-06-19 (×6): 1 via ORAL
  Filled 2019-06-14 (×6): qty 1

## 2019-06-14 NOTE — Progress Notes (Signed)
PROGRESS NOTE    Jeffrey Skinner  ZOX:[id number] DOB: [birth year] DOA: 06/09/2019 PCP: Patient, No Pcp Per   Brief Narrative:  63 y.o. M with PMH of obesity and OSA who presented to the ED with worsening of his chronic SOB after doing some yard work.  He was initially satting well on room air, but had worsening respiratory failure in the ED that required Bipap and then intubation due to severe hypercarbic respiratory failure. History of admission for respiratory failure in 2018 where he was diagnosed with multi-factorial hypoxic respiratory failure likely secondary to undiagnosed COPD, OSA and obesity hypoventilation syndrome.  Has not been able to cope with weight loss for psychiatric reasons.  He was admitted to the ICU for ventilator support.  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 -Suspect coexisting COPD although apparently not supported [facility] -Suspect total body volume overload due to decompensated secondary pulmonary hypertension, cor pulmonale with associated right pleural effusion and diastolic dysfunction as above. -Continue BiPAP nightly and when napping. -PT ongoing -Continue Lasix. Currently net -6.2L.   -Minimize sedating medications  AKI, mild, likely secondary to aggressive diuresis as above  -Now improved on home diuretic regimen -Follow BMP -Hold off on adding back lisinopril for now -blood pressure currently well controlled  HTN, essential, well-controlled -holding home Lisinopril in the setting  of AKI, aggressive diuresis and control blood pressure as above -Continues on home amlodipine 5 mg daily  Depression/Concern for suicidal ideation at presentation -ED reported to ICU nursing that pt mentioned he "did not want to live like this anymore" P: Dr. Delton CoombesByrum previously evaluated.  He denies any suicidal ideation or intention to harm himself.    DVT prophylaxis: Heparin, per pharmacy recommendations Code Status: Full Disposition Plan: Pending clinical improvement, likely ultimate disposition home once able to ambulate without hypoxia  Subjective: Issues or events overnight, tolerating PT somewhat well this morning, ambulation appears to be improving daily per the patient.  Otherwise declines fevers, chills, chest pain, nausea, vomiting, diarrhea, constipation, headache.  Patient indicates his shortness of breath and edema does appear to be improving daily but not yet back to baseline.  Objective: Vitals:   06/14/19 0038 06/14/19 0503 06/14/19 0742 06/14/19 0925  BP:  108/74 106/65 (!) 110/56  Pulse: 86 88 82 86  Resp: 20 16 20 20   Temp:  97.7 F (36.5 C) 97.6 F (36.4 C) 98.2 F (36.8 C)  TempSrc:  Oral Oral Oral  SpO2: 93% 95% 97% 96%  Weight:  (!) 248 kg    Height:        Intake/Output Summary (Last 24 hours) at 06/14/2019 1221 Last data filed at 06/14/2019 1043 Gross per 24 hour  Intake 857.45 ml  Output 2275 ml  Net -1417.55 ml   Filed Weights   06/13/19 0300 06/13/19 2216 06/14/19 0503  Weight: (!) 241.1 kg (!) 248.6 kg (!) 248 kg    Examination:  General exam: Appears calm and comfortable  Respiratory system: Clear to auscultation. Respiratory effort normal. Cardiovascular system: S1 & S2 heard, RRR.  No JVD, murmurs, rubs, gallops or clicks. No pedal edema. Gastrointestinal system: Abdomen is nondistended, soft and nontender. No organomegaly or masses felt. Normal bowel sounds heard. Central nervous system: Alert and oriented. No focal neurological  deficits. Extremities: Symmetric 5 x 5 power. Skin: No rashes, lesions or ulcers Psychiatry: Judgement and insight appear normal. Mood & affect appropriate.     Data Reviewed: I have personally reviewed following labs and imaging studies  CBC: Recent Labs  Lab 06/09/19 1340  06/10/19 0550  06/11/19 0417 06/11/19 0449 06/12/19 0013 06/13/19 0235 06/14/19 0759  WBC 9.5  --  9.5  --  9.9  --  10.5 8.4 8.5  NEUTROABS 6.6  --   --   --   --   --   --   --   --   HGB 14.8   < > 15.1   < > 14.1 14.6 13.4 13.1 13.6  HCT 51.9   < > 49.9   < > 47.1 43.0 45.7 45.0 47.2  MCV 97.9  --  91.6  --  91.8  --  92.3 94.3 95.9  PLT 197  --  182  --  191  --  188 183 192   < > = values in this interval not displayed.   Basic Metabolic Panel: Recent Labs  Lab 06/09/19 1158  06/10/19 0550 06/10/19 1543 06/11/19 0417 06/11/19 0449 06/12/19 0013 06/13/19 0235  NA 140   < > 138 140 142 140 142 141  K 4.2   < > 4.2 3.6 3.7 3.3* 3.8 4.2  CL 95*  --  95*  --  98  --  100 98  CO2 37*  --  29  --  32  --  31 34*  GLUCOSE 120*  --  117*  --  97  --  97 101*  BUN 10  --  11  --  19  --  18 12  CREATININE 0.88  --  1.04  --  1.24  --  1.05 0.83  CALCIUM 8.5*  --  8.1*  --  7.7*  --  7.7* 7.9*  MG  --   --   --   --  1.7  --   --  2.0   < > = values in this interval not displayed.   GFR: Estimated Creatinine Clearance: 200 mL/min (by C-G formula based on SCr of 0.83 mg/dL). Liver Function Tests: Recent Labs  Lab 06/09/19 1158 06/11/19 0417  AST 19 22  ALT 15 18  ALKPHOS 69 50  BILITOT 0.5 1.1  PROT 8.2* 6.2*  ALBUMIN 3.3* 2.3*   No results for input(s): LIPASE, AMYLASE in the last 168 hours. No results for input(s): AMMONIA in the last 168 hours. Coagulation Profile: Recent Labs  Lab 06/09/19 1311  INR 1.0   Cardiac Enzymes: No results for input(s): CKTOTAL, CKMB, CKMBINDEX, TROPONINI in the last 168 hours. BNP (last 3 results) No results for input(s): PROBNP in the last 8760  hours. HbA1C: No results for input(s): HGBA1C in the last 72 hours. CBG: Recent Labs  Lab 06/11/19 0000 06/11/19 0359 06/11/19 0839 06/11/19 1215 06/11/19 1604  GLUCAP 109* 86 92 90 102*   Lipid Profile: Recent Labs    06/12/19 0013  TRIG 69   Thyroid Function Tests: No results for input(s): TSH, T4TOTAL, FREET4, T3FREE, THYROIDAB in the last 72 hours. Anemia Panel: No results for input(s): VITAMINB12, FOLATE, FERRITIN, TIBC, IRON, RETICCTPCT in the last 72 hours.  Sepsis Labs: No results for input(s): PROCALCITON, LATICACIDVEN in the last 168 hours.  Recent Results (from the past 240 hour(s))  SARS Coronavirus 2 [facility] order, Performed in [facility] hospital lab) Nasopharyngeal Nasopharyngeal Swab     Status: None   Collection Time: 06/09/19  2:40 PM   Specimen: Nasopharyngeal Swab  Result Value Ref Range Status   SARS Coronavirus 2 NEGATIVE NEGATIVE Final    Comment: (NOTE) If result is NEGATIVE SARS-CoV-2 target nucleic acids are NOT DETECTED. The SARS-CoV-2 RNA is generally detectable in upper and lower  respiratory specimens during the acute phase of infection. The lowest  concentration of SARS-CoV-2 viral copies this assay can detect is 250  copies / mL. A negative result does not preclude SARS-CoV-2 infection  and should not be used as the sole basis for treatment or other  patient management decisions.  A negative result may occur with  improper specimen collection / handling, submission of specimen other  than nasopharyngeal swab, presence of viral mutation(s) within the  areas targeted by this assay, and inadequate number of viral copies  (<250 copies / mL). A negative result must be combined with clinical  observations, patient history, and epidemiological information. If result is POSITIVE SARS-CoV-2 target nucleic acids are DETECTED. The SARS-CoV-2 RNA is generally detectable in upper and lower  respiratory specimens dur ing the acute phase of  infection.  Positive  results are indicative of active infection with SARS-CoV-2.  Clinical  correlation with patient history and other diagnostic information is  necessary to determine patient infection status.  Positive results do  not rule out bacterial infection or co-infection with other viruses. If result is PRESUMPTIVE POSTIVE SARS-CoV-2 nucleic acids MAY BE PRESENT.   A presumptive positive result was obtained on the submitted specimen  and confirmed on repeat testing.  While 2019 novel coronavirus  (SARS-CoV-2) nucleic acids may be present in the submitted sample  additional confirmatory testing may be necessary for epidemiological  and / or clinical management purposes  to differentiate between  SARS-CoV-2 and other Sarbecovirus currently known to infect humans.  If clinically indicated additional testing with an alternate test  methodology [phone]) is advised. The SARS-CoV-2 RNA is generally  detectable in upper and lower respiratory sp ecimens during the acute  phase of infection. The expected result is Negative. Fact Sheet for Patients:  StrictlyIdeas.no Fact Sheet for Healthcare Providers: BankingDealers.co.za This test is not yet approved or cleared by the [place] FDA and has been authorized for detection and/or diagnosis of SARS-CoV-2 by FDA under an Emergency Use Authorization (EUA).  This EUA will remain in effect (meaning this test can be used) for the duration of the COVID-19 declaration under Section 564(b)(1) of the Act, 21 U.S.C. section 360bbb-3(b)(1), unless the authorization is terminated or revoked sooner. Performed at [facility], [place] [address] [place], [place] [address]   MRSA PCR Screening     Status: None   Collection Time: 06/09/19  8:30 PM   Specimen: Nasopharyngeal  Result Value Ref Range Status   MRSA by PCR NEGATIVE NEGATIVE Final    Comment:        The GeneXpert MRSA Assay (FDA  approved for NASAL specimens only), is one component of a comprehensive MRSA colonization surveillance program. It is not intended to diagnose MRSA infection nor to guide or monitor treatment for MRSA infections. Performed at [facility], [place] [address] [place], [place] [address]   Culture, respiratory (non-expectorated)  Status: None   Collection Time: 06/10/19  4:27 PM   Specimen: Tracheal Aspirate; Respiratory  Result Value Ref Range Status   Specimen Description TRACHEAL ASPIRATE  Final   Special Requests NONE  Final   Gram Stain   Final    MODERATE WBC PRESENT,BOTH PMN AND MONONUCLEAR MODERATE GRAM POSITIVE COCCI IN PAIRS FEW GRAM POSITIVE COCCI IN CHAINS    Culture   Final    FEW Consistent with normal respiratory flora. Performed at [facility], [address], [place], [place] [address]    Report Status 06/12/2019 FINAL  Final         Radiology Studies: No results found.      Scheduled Meds: . amLODipine  5 mg Oral Daily  . Chlorhexidine Gluconate Cloth  6 each Topical Daily  . furosemide  20 mg Intravenous BID  . influenza vac split quadrivalent PF  0.5 mL Intramuscular Tomorrow-1000  . mouth rinse  15 mL Mouth Rinse BID  . sodium chloride flush  10-40 mL Intracatheter Q12H   Continuous Infusions: . sodium chloride 10 mL/hr at 06/13/19 2000    LOS: 5 days   Time spent: >35 minutes  Azucena Fallen, DO [facility]  If 7PM-7AM, please contact night-coverage [email or web address] Password [facility] 06/14/2019, 12:21 PM

## 2019-06-14 NOTE — Progress Notes (Signed)
Pt refuses CPAP for the night. positive reinforcement given to patient that its needed for him to wear CPAP at night and the benefits of it. Patient continues to push my hand away and keep removing the mask. Patient remains on nasal cannula 2L.

## 2019-06-14 NOTE — Progress Notes (Signed)
Nutrition Follow-up  DOCUMENTATION CODES:   Morbid obesity  INTERVENTION:   -Double protein portions with meals -MVI with minerals daily -30 ml Prostat BID, each supplement provides 100 kcals and 15 grams protein  NUTRITION DIAGNOSIS:   Increased nutrient needs related to chronic illness as evidenced by estimated needs.  Progressing  GOAL:   Patient will meet greater than or equal to 90% of their needs  Progressing   MONITOR:   PO intake, Supplement acceptance, Labs, Weight trends, Skin, I & O's  REASON FOR ASSESSMENT:   Ventilator, Consult Enteral/tube feeding initiation and management  ASSESSMENT:   63 year old male who presented to the ED on 9/09 with SOB and LE swelling. PMH of respiratory failure with hypoxia, HTN, OSA/OHS, COPD, CHF. Pt required intubation in the ED. X-ray showing CHF.  9/11- self-extubated  Reviewed I/O's: -2 L x 24 hours and -6.5 L since admission  UOP: 3 L x 24 hours  Pt on phone at time of visit. Pt with good appetite. Noted meal completion 75-100%.   Per CSW notes, plan for home health services at discharge.   Medications reviewed and 0.9% sodium chloride infusion @ 10 ml/hr.   Labs reviewed: CBGS: 102.   Diet Order:   Diet Order            Diet - low sodium heart healthy        Diet Heart Room service appropriate? Yes; Fluid consistency: Thin  Diet effective now              EDUCATION NEEDS:   No education needs have been identified at this time  Skin:  Skin Assessment: Reviewed RN Assessment  Last BM:  06/11/19  Height:   Ht Readings from Last 1 Encounters:  06/12/19 6\' 4"  (1.93 m)    Weight:   Wt Readings from Last 1 Encounters:  06/14/19 (!) 248 kg    Ideal Body Weight:  94.5 kg  BMI:  Body mass index is 66.55 kg/m.  Estimated Nutritional Needs:   Kcal:  2150-2350  Protein:  140-155 grams  Fluid:  > 2.1 L    [place] Every A. Jimmye Norman, RD, LDN, [place] Registered Dietitian II Certified Diabetes  Care and Education Specialist Pager: [phone] After hours Pager: [phone]

## 2019-06-14 NOTE — Progress Notes (Signed)
RT placed patient on BIPAP with 5L O2 bled into circuit. Patient tolerating well at this time. RT will monitor as needed.

## 2019-06-14 NOTE — Progress Notes (Signed)
Physical Therapy Treatment Patient Details Name: Jeffrey Skinner MRN: [id number] DOB: 30-Jan-1956 Today's Date: 06/14/2019    History of Present Illness Pt adm with Acute on chronic hypercapnic and hypoxic respiratory failure and intubated 9/9-9/11. PMH - morbid obesity, HTN, OSA/OHS and chronic respiratory failure     PT Comments    Pt pleasant in bed on arrival and able to exit to left of bed with bed noted to be saturated in urine. Assist for pericare and linen change prior to gait. Pt with SpO2 80% on RA on arrival with [place] on pt forehead with education for need for use with SpO2 93% on 2L at rest, 94% on 4L with gait. Pt with drop arm recliner with arm rest stuck and therefore recliner too narrow for pt and pt returned to bed with unit notified of need for bari recliner. Pt educated for HEP and mobility progression to return home.    Follow Up Recommendations  Home health PT;Supervision for mobility/OOB     Equipment Recommendations  Rolling walker with 5" wheels(bari)    Recommendations for Other Services       Precautions / Restrictions Precautions Precautions: Fall Precaution Comments: watch sats    Mobility  Bed Mobility Overal bed mobility: Needs Assistance Bed Mobility: Supine to Sit;Sit to Supine     Supine to sit: Supervision Sit to supine: Supervision   General bed mobility comments: increased time with reliance on rail and increaed effort to exit to left of bed. Return to bed with use of rail and cues for positioning in bed. Pt with bari bed with rail removed and air inflated end of session  Transfers Overall transfer level: Needs assistance   Transfers: Sit to/from Stand Sit to Stand: Min guard         General transfer comment: cues for hand placement for safety with pt able to stand from bari bed and low recliner with use of momentum  Ambulation/Gait Ambulation/Gait assistance: Min guard Gait Distance (Feet): 60 Feet Assistive device: Rolling walker  (2 wheeled) Gait Pattern/deviations: Step-through pattern;Decreased stride length;Trunk flexed   Gait velocity interpretation: >2.62 ft/sec, indicative of community ambulatory General Gait Details: wide BOS with use of bari RW with pt reporting increased stability with use of RW as he does not use at home. Pt limited by back pain with SpO2 94% on 4L during gait   Stairs             Wheelchair Mobility    Modified Rankin (Stroke Patients Only)       Balance Overall balance assessment: Mild deficits observed, not formally tested                                          Cognition Arousal/Alertness: Awake/alert Behavior During Therapy: WFL for tasks assessed/performed Overall Cognitive Status: Impaired/Different from baseline Area of Impairment: Safety/judgement;Problem solving                             Problem Solving: Slow processing General Comments: pt with [place] off on arrival and when placing it back on pt stated "well, how am I going to talk with that thing?"      Exercises General Exercises - Lower Extremity Long Arc Quad: AROM;Both;Seated;15 reps Hip Flexion/Marching: AROM;Both;Seated;15 reps    General Comments        Pertinent  Vitals/Pain Pain Assessment: 0-10 Pain Score: 3  Pain Location: low back pain with gait Pain Descriptors / Indicators: Aching Pain Intervention(s): Limited activity within patient's tolerance;Repositioned    Home Living                      Prior Function            PT Goals (current goals can now be found in the care plan section) Progress towards PT goals: Progressing toward goals    Frequency    Min 3X/week      PT Plan Current plan remains appropriate    Co-evaluation              AM-PAC PT "6 Clicks" Mobility   Outcome Measure  Help needed turning from your back to your side while in a flat bed without using bedrails?: A Little Help needed moving from lying on  your back to sitting on the side of a flat bed without using bedrails?: A Little Help needed moving to and from a bed to a chair (including a wheelchair)?: A Little Help needed standing up from a chair using your arms (e.g., wheelchair or bedside chair)?: A Little Help needed to walk in hospital room?: A Little Help needed climbing 3-5 steps with a railing? : A Lot 6 Click Score: 17    End of Session Equipment Utilized During Treatment: Oxygen Activity Tolerance: Patient limited by fatigue Patient left: in bed;with call bell/phone within reach Nurse Communication: Mobility status PT Visit Diagnosis: Other abnormalities of gait and mobility (R26.89);Muscle weakness (generalized) (M62.81)     Time: 6761-9509 PT Time Calculation (min) (ACUTE ONLY): 36 min  Charges:  $Gait Training: 8-22 mins $Therapeutic Activity: 8-22 mins                     [place], PT Acute Rehabilitation Services Pager: [phone] Office: [place] 06/14/2019, 1:44 PM

## 2019-06-14 NOTE — Progress Notes (Signed)
Occupational Therapy Evaluation Patient Details Name: Jeffrey Skinner MRN: [id number] DOB: Jun 29, 1956 Today's Date: 06/14/2019    History of Present Illness Pt adm with Acute on chronic hypercapnic and hypoxic respiratory failure and intubated 9/9-9/11. PMH - morbid obesity, HTN, OSA/OHS and chronic respiratory failure    Clinical Impression   PTA, pt was living at home with his wife, and reports he was independent with ADL/IADL and functional mobility with use of RW. 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 Pt reports he leads a sedentary life. Pt currently requires minguard for ADL completion at sink level and minguard for functional mobility at RW level. Pt on 2lnc, spO2 90%-95% throughout session. Educated pt on importance of pursed lip breathing and energy conservation strategies during ADL. Pt stated he feels he "gave up on life somewhere along the line", discussed pt discussing this with MD and looking into counseling/therapy services in the community. Due to decline in current level of function, pt would benefit from acute OT to address established goals to facilitate safe D/C to venue listed below. At this time, recommend HHOT follow-up. Will continue to follow acutely.     Follow Up Recommendations  Home health OT;Supervision - Intermittent    Equipment Recommendations  None recommended by OT    Recommendations for Other Services (counseling services)     Precautions / Restrictions Precautions Precautions: Fall Precaution Comments: watch sats Restrictions Weight Bearing Restrictions: No      Mobility Bed Mobility Overal bed mobility: Needs Assistance Bed Mobility: Supine to Sit;Sit to Supine     Supine to sit: Mod assist;+2 for physical assistance Sit to supine: Supervision   General bed mobility comments: modA+2 to progress trunk to upright position, use of bed rail, increased assistance required due to bed limiting HOB elevation and problems with air inflation of  bed  Transfers Overall transfer level: Needs assistance Equipment used: Rolling walker (2 wheeled) Transfers: Sit to/from Stand Sit to Stand: Min guard         General transfer comment: cues for hand placement for safety with pt able to stand from bari bed and low recliner with use of momentum    Balance Overall balance assessment: Mild deficits observed, not formally tested                                         ADL either performed or assessed with clinical judgement   ADL Overall ADL's : Needs assistance/impaired Eating/Feeding: Set up;Sitting   Grooming: Min guard;Standing Grooming Details (indicate cue type and reason): completed at sink level;pt would lean and support BUE on sink to decrease lower back pain Upper Body Bathing: Supervision/ safety;Sitting   Lower Body Bathing: Minimal assistance;Sit to/from stand Lower Body Bathing Details (indicate cue type and reason): to access feet due to large body habitus Upper Body Dressing : Supervision/safety   Lower Body Dressing: Minimal assistance Lower Body Dressing Details (indicate cue type and reason): access to feet due to large body habitus Toilet Transfer: Min guard;Requires wide/bariatric;RW Toilet Transfer Details (indicate cue type and reason): simulated in room Toileting- Clothing Manipulation and Hygiene: Min guard;Sit to/from stand Toileting - Clothing Manipulation Details (indicate cue type and reason): for stability in standing     Functional mobility during ADLs: Min guard;Rolling walker General ADL Comments: minguard for safety;began education on energy conservation strategies during ADL completion, pt familiar with the idea but would  benefit from continued reinforcement and education     Vision Baseline Vision/History: Wears glasses Patient Visual Report: No change from baseline       Perception     Praxis      Pertinent Vitals/Pain Pain Assessment: Faces Pain Score: 3  Faces  Pain Scale: Hurts little more Pain Location: low back pain with gait Pain Descriptors / Indicators: Aching Pain Intervention(s): Limited activity within patient's tolerance;Monitored during session     Hand Dominance Right   Extremity/Trunk Assessment Upper Extremity Assessment Upper Extremity Assessment: Overall WFL for tasks assessed   Lower Extremity Assessment Lower Extremity Assessment: Defer to PT evaluation       Communication Communication Communication: No difficulties   Cognition Arousal/Alertness: Awake/alert Behavior During Therapy: WFL for tasks assessed/performed Overall Cognitive Status: Within Functional Limits for tasks assessed Area of Impairment: Safety/judgement;Problem solving                             Problem Solving: Slow processing General Comments: Pt reports wanting to "get his head straight" and talk with a therapist and discussed having "given up on life" at some point along the line;discussed communicating this with MD and looking into counseling services in the community.    General Comments  pt on 2lnc, spo2 90-95 during ADL completion at sink level, educated pt on importance of pursed lip breathing and energy conservation strategies    Exercises General Exercises - Lower Extremity Long Arc Quad: AROM;Both;Seated;15 reps Hip Flexion/Marching: AROM;Both;Seated;15 reps   Shoulder Instructions      Home Living Family/patient expects to be discharged to:: Private residence Living Arrangements: Spouse/significant other;Children Available Help at Discharge: Family;Available PRN/intermittently Type of Home: House Home Access: Ramped entrance     Home Layout: Two level;Laundry or work area in basement;Able to live on main level with Actor: None          Prior Functioning/Environment Level of Independence: Needs assistance  Gait / Transfers Assistance  Needed: independent without assistive device ADL's / Homemaking Assistance Needed: independent   Comments: 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        OT Problem List: Decreased strength;Decreased activity tolerance;Impaired balance (sitting and/or standing);Decreased knowledge of use of DME or AE;Cardiopulmonary status limiting activity;Pain      OT Treatment/Interventions: Self-care/ADL training;Therapeutic exercise;Energy conservation;DME and/or AE instruction;Therapeutic activities;Patient/family education;Balance training    OT Goals(Current goals can be found in the care plan section) Acute Rehab OT Goals Patient Stated Goal: get better OT Goal Formulation: With patient Time For Goal Achievement: 06/28/19 Potential to Achieve Goals: Good ADL Goals Pt Will Perform Grooming: with modified independence Pt Will Perform Upper Body Dressing: with modified independence Pt Will Perform Lower Body Dressing: with modified independence Pt Will Transfer to Toilet: with modified independence Additional ADL Goal #1: Pt will independently use 3 energy conservation strategies during ADL completion.  OT Frequency: Min 2X/week   Barriers to D/C:            Co-evaluation              AM-PAC OT "6 Clicks" Daily Activity     Outcome Measure Help from another person eating meals?: None Help from another person taking care of personal grooming?: A Little Help from another person toileting, which includes using toliet, bedpan, or urinal?: A Little Help from another  person bathing (including washing, rinsing, drying)?: A Little Help from another person to put on and taking off regular upper body clothing?: A Little Help from another person to put on and taking off regular lower body clothing?: A Little 6 Click Score: 19   End of Session Equipment Utilized During Treatment: Rolling walker;Oxygen Nurse Communication: Mobility status  Activity Tolerance: Patient tolerated  treatment well Patient left: in chair;with call bell/phone within reach  OT Visit Diagnosis: Other abnormalities of gait and mobility (R26.89);Pain Pain - part of body: (lower back)                Time: [id number] OT Time Calculation (min): 37 min Charges:  OT General Charges $OT Visit: 1 Visit OT Evaluation $OT Eval Moderate Complexity: 1 Mod OT Treatments $Self Care/Home Management : 8-22 mins  Diona Brownereresa Minda Faas OTR/L Acute Rehabilitation Services Office: [phone]   Rebeca Alerteresa J Taina Landry 06/14/2019, 2:40 PM

## 2019-06-14 NOTE — Plan of Care (Signed)
  Problem: Clinical Measurements: Goal: Will remain free from infection Outcome: Progressing   Problem: Activity: Goal: Risk for activity intolerance will decrease Outcome: Progressing   Problem: Elimination: Goal: Will not experience complications related to urinary retention Outcome: Progressing   Problem: Safety: Goal: Ability to remain free from injury will improve Outcome: Progressing   

## 2019-06-14 NOTE — TOC Initial Note (Signed)
Transition of Care [facility]) - Initial/Assessment Note    Patient Details  Name: Jeffrey Skinner MRN: [id number] Date of Birth: [birth year]  Transition of [facility]) CM/SW Contact:    Alberteen Sam, [place] Phone Number: [phone] 06/14/2019, 2:44 PM  Clinical Narrative:                  CSW met with patient to discuss home health needs at discharge. Patient agreeable to [place] with no preference of agency. He reports he would need a walker at home as well as his is broken.   CSW has reached out to [facility] who is able to provide patient with PT services and [employer] for bariatric rolling walker to be delivered to room.   Patient reports family will pick him up at time of discharge.   No further needs identified.   Expected Discharge Plan: [place] Barriers to Discharge: Continued Medical Work up   Patient Goals and CMS Choice Patient states their goals for this hospitalization and ongoing recovery are:: to go home CMS Medicare.gov Compare Post Acute Care list provided to:: Patient Choice offered to / list presented to : Patient  Expected Discharge Plan and Services Expected Discharge Plan: [place]       Living arrangements for the past 2 months: Single Family Home                           HH Arranged: PT [place] Agency: [facility] [employer] Date [place]: 06/14/19 Time [place]: 1443 Representative spoke with at [place]: [place] Arrangements/Services Living arrangements for the past 2 months: [place] with:: Self Patient language and need for interpreter reviewed:: Yes Do you feel safe going back to the place where you live?: Yes      Need for Family Participation in Patient Care: No (Comment) Care giver support system in place?: Yes (comment)   Criminal Activity/Legal Involvement Pertinent to Current Situation/Hospitalization: No -  Comment as needed  Activities of Daily Living Home Assistive Devices/Equipment: Nebulizer, Reacher, Eyeglasses, BIPAP, Walker (specify type), Blood pressure cuff, Raised toilet seat with rails ADL Screening (condition at time of admission) Patient's cognitive ability adequate to safely complete daily activities?: No Is the patient deaf or have difficulty hearing?: No Does the patient have difficulty seeing, even when wearing glasses/contacts?: Yes Does the patient have difficulty concentrating, remembering, or making decisions?: Yes Patient able to express need for assistance with ADLs?: Yes Does the patient have difficulty dressing or bathing?: Yes Independently performs ADLs?: No Communication: Independent Dressing (OT): Independent Grooming: Needs assistance Feeding: Independent Bathing: Independent Toileting: Needs assistance Is this a change from baseline?: Pre-admission baseline In/Out Bed: Independent with device (comment) Walks in Home: Needs assistance Is this a change from baseline?: Pre-admission baseline Does the patient have difficulty walking or climbing stairs?: Yes Weakness of Legs: Left(tingles) Weakness of Arms/Hands: Both(tingle)  Permission Sought/Granted Permission sought to share information with : Case Manager, Customer service manager, Family Supports Permission granted to share information with : Yes, Verbal Permission Granted     Permission granted to share info w AGENCY: Home Health        Emotional Assessment Appearance:: Appears stated age Attitude/Demeanor/Rapport: Gracious Affect (typically observed): Calm Orientation: : Oriented to Self, Oriented to Place, Oriented to Situation, Oriented to  Time Alcohol / Substance Use: Not  Applicable Psych Involvement: No (comment)  Admission diagnosis:  Chest pain [R07.9] Acute respiratory failure with hypoxia and hypercapnia (HCC) [J96.01, J96.02] Patient Active Problem List   Diagnosis Date  Noted  . Acute diastolic CHF (congestive heart failure) ([place]) 06/14/2019  . Acute respiratory failure with hypoxia and hypercapnia ([place]) 06/09/2019  . Acute on chronic respiratory failure with hypoxia and hypercapnia ([place]) 06/09/2019  . Respiratory failure with hypoxia ([place]) 01/09/2017  . SOB (shortness of breath)   . Hypoxia    PCP:  Patient, No Pcp Per Pharmacy:   [id number]: [phone]: [phone]    Social Determinants of Health (SDOH) Interventions    Readmission Risk Interventions No flowsheet data found.

## 2019-06-14 NOTE — Progress Notes (Signed)
OT Cancellation Note  Patient Details Name: Jeffrey Skinner MRN: [id number] DOB: 01/15/1956   Cancelled Treatment:    Reason Eval/Treat Not Completed: Fatigue/lethargy limiting ability to participate pt requesting OT return this afternoon. Pt reports increased fatigue after working with PT this am stating his days in the hospital have been "more strenuous than my typical day at home". OT to return later as time allows and pt is appropriate.   Dorinda Hill OTR/L Acute Rehabilitation Services Office: [place] 06/14/2019, 9:46 AM

## 2019-06-15 LAB — BASIC METABOLIC PANEL
Anion gap: 12 (ref 5–15)
BUN: 12 mg/dL (ref 8–23)
CO2: 33 mmol/L — ABNORMAL HIGH (ref 22–32)
Calcium: 8.3 mg/dL — ABNORMAL LOW (ref 8.9–10.3)
Chloride: 95 mmol/L — ABNORMAL LOW (ref 98–111)
Creatinine, Ser: 0.72 mg/dL (ref 0.61–1.24)
GFR calc Af Amer: >60 mL/min (ref >60)
GFR calc non Af Amer: >60 mL/min (ref >60)
Glucose, Bld: 101 mg/dL — ABNORMAL HIGH (ref 70–99)
Potassium: 4.6 mmol/L (ref 3.5–5.1)
Sodium: 140 mmol/L (ref 135–145)

## 2019-06-15 LAB — CBC
HCT: 45.6 % (ref 39.0–52.0)
Hemoglobin: 13.2 g/dL (ref 13.0–17.0)
MCH: 27.6 pg (ref 26.0–34.0)
MCHC: 28.9 g/dL — ABNORMAL LOW (ref 30.0–36.0)
MCV: 95.2 fL (ref 80.0–100.0)
Platelets: 187 10*3/uL (ref 150–400)
RBC: 4.79 MIL/uL (ref 4.22–5.81)
RDW: 15.8 % — ABNORMAL HIGH (ref 11.5–15.5)
WBC: 8.2 10*3/uL (ref 4.0–10.5)
nRBC: 0 % (ref 0.0–0.2)

## 2019-06-15 MED ORDER — MENTHOL 3 MG MT LOZG
1.0000 | LOZENGE | OROMUCOSAL | Status: DC | PRN
  Administered 2019-06-15: 3 mg via ORAL
  Filled 2019-06-15: qty 9

## 2019-06-15 NOTE — Progress Notes (Signed)
PROGRESS NOTE    Jeffrey Skinner  ZOX:[id number] DOB: Nov 24, 1955 DOA: 06/09/2019 PCP: Patient, No Pcp Per   Brief Narrative:  63 y.o. M with PMH of obesity and OSA who presented to the ED with worsening of his chronic SOB after doing some yard work.  He was initially satting well on room air, but had worsening respiratory failure in the ED that required Bipap and then intubation due to severe hypercarbic respiratory failure. History of admission for respiratory failure in 2018 where he was diagnosed with multi-factorial hypoxic respiratory failure likely secondary to undiagnosed COPD, OSA and obesity hypoventilation syndrome.  Has not been able to cope with weight loss for psychiatric reasons.  He was admitted to the ICU for ventilator support.  Assessment & Plan:   Active Problems:   Acute respiratory failure with hypoxia and hypercapnia (HCC)   Acute on chronic respiratory failure with hypoxia and hypercapnia (HCC)   Acute diastolic CHF (congestive heart failure) (HCC)   Acute on chronic hypercapnic and hypoxic respiratory failure, POA, multifactorial OSA/OHS Heart failure with preserved ejection fraction, diastolic dysfunction EF 60 to 81%65% -Continues to be hypoxic requiring upwards of 5 L nasal cannula at rest; baseline oxygen requirements at home are room air even with exertion -Unclear if heart failure is a new diagnosis for the patient -previous echocardiogram here 2018 unremarkable -Suspect coexisting COPD although apparently not supported when [facility] -Continue aggressive diuresis, net -6.3 L -Continue BiPAP nightly and when napping - moderately noncompliant over past 24h -PT ongoing -poor ambulation ongoing likely in the setting of morbid obesity -Minimize sedating medications  AKI, resolved -Now improved on home diuretic regimen -Follow BMP -Hold off on adding back lisinopril for now -blood pressure currently well controlled  HTN, essential,  well-controlled -holding home Lisinopril in the setting of AKI, aggressive diuresis and control blood pressure as above -Continues on home amlodipine 5 mg daily  Ambulatory dysfunction  -Likely the setting of morbid obesity as above, PT OT ongoing, likely disposition home with home health  Morbid obesity  -Lengthy discussion at bedside, patient understands that losing weight and doing a more active lifestyle would greatly benefit his respiratory status -continues to work with PCP on dietary changes; has seen nutrition/dietary previously  Depression/Concern for suicidal ideation at presentation -ED reported to ICU nursing that pt mentioned he "did not want to live like this anymore" P: Dr. Delton CoombesByrum previously evaluated.  He denies any suicidal ideation or intention to harm himself.    DVT prophylaxis: Heparin, per pharmacy recommendations Code Status: Full Disposition Plan: Pending clinical improvement, likely ultimate disposition home once able to ambulate without hypoxia on room air  Subjective: No acute issues or events overnight, denies chest pain, shortness of breath, nausea, vomiting, diarrhea, constipation, headache, fevers, chills.  Objective: Vitals:   06/14/19 2131 06/15/19 0640 06/15/19 0651 06/15/19 0736  BP: (!) 157/72 (!) 147/71  116/71  Pulse: 84 89  87  Resp: 20 20  19   Temp: 98 F (36.7 C) 98 F (36.7 C)  98 F (36.7 C)  TempSrc: Oral Oral  Oral  SpO2: 92% 91%  98%  Weight:   (!) 247 kg   Height:        Intake/Output Summary (Last 24 hours) at 06/15/2019 0737 Last data filed at 06/14/2019 2143 Gross per 24 hour  Intake 1239 ml  Output 1100 ml  Net 139 ml   Filed Weights   06/13/19 2216 06/14/19 0503 06/15/19  9833  Weight: (!) 248.6 kg (!) 248 kg (!) 247 kg    Examination:  General: Morbidly obese gentleman pleasantly resting in bed, No acute distress. HEENT:  Normocephalic atraumatic.  Sclerae nonicteric, noninjected.  Extraocular movements intact  bilaterally. Neck:  Without mass or deformity. Trachea is midline. Lungs:  Clear to auscultate bilaterally without rhonchi, wheeze, or rales. Heart:  Regular rate and rhythm.  Without murmurs, rubs, or gallops. Abdomen:  Soft, nontender, nondistended.  Without guarding or rebound. Extremities: Without cyanosis, clubbing, edema, or obvious deformity. Vascular:  Dorsalis pedis and posterior tibial pulses palpable bilaterally.  Data Reviewed: I have personally reviewed following labs and imaging studies  CBC: Recent Labs  Lab 06/09/19 1340  06/10/19 0550  06/11/19 0417 06/11/19 0449 06/12/19 0013 06/13/19 0235 06/14/19 0759  WBC 9.5  --  9.5  --  9.9  --  10.5 8.4 8.5  NEUTROABS 6.6  --   --   --   --   --   --   --   --   HGB 14.8   < > 15.1   < > 14.1 14.6 13.4 13.1 13.6  HCT 51.9   < > 49.9   < > 47.1 43.0 45.7 45.0 47.2  MCV 97.9  --  91.6  --  91.8  --  92.3 94.3 95.9  PLT 197  --  182  --  191  --  188 183 192   < > = values in this interval not displayed.   Basic Metabolic Panel: Recent Labs  Lab 06/09/19 1158  06/10/19 0550 06/10/19 1543 06/11/19 0417 06/11/19 0449 06/12/19 0013 06/13/19 0235  NA 140   < > 138 140 142 140 142 141  K 4.2   < > 4.2 3.6 3.7 3.3* 3.8 4.2  CL 95*  --  95*  --  98  --  100 98  CO2 37*  --  29  --  32  --  31 34*  GLUCOSE 120*  --  117*  --  97  --  97 101*  BUN 10  --  11  --  19  --  18 12  CREATININE 0.88  --  1.04  --  1.24  --  1.05 0.83  CALCIUM 8.5*  --  8.1*  --  7.7*  --  7.7* 7.9*  MG  --   --   --   --  1.7  --   --  2.0   < > = values in this interval not displayed.   GFR: Estimated Creatinine Clearance: 199.5 mL/min (by C-G formula based on SCr of 0.83 mg/dL). Liver Function Tests: Recent Labs  Lab 06/09/19 1158 06/11/19 0417  AST 19 22  ALT 15 18  ALKPHOS 69 50  BILITOT 0.5 1.1  PROT 8.2* 6.2*  ALBUMIN 3.3* 2.3*   Coagulation Profile: Recent Labs  Lab 06/09/19 1311  INR 1.0   CBG: Recent Labs  Lab  06/11/19 0000 06/11/19 0359 06/11/19 0839 06/11/19 1215 06/11/19 1604  GLUCAP 109* 86 92 90 102*   Recent Results (from the past 240 hour(s))  SARS Coronavirus 2 [facility] order, Performed in [facility] hospital lab) Nasopharyngeal Nasopharyngeal Swab     Status: None   Collection Time: 06/09/19  2:40 PM   Specimen: Nasopharyngeal Swab  Result Value Ref Range Status   SARS Coronavirus 2 NEGATIVE NEGATIVE Final    Comment: (NOTE) If result is NEGATIVE SARS-CoV-2 target nucleic acids are NOT  DETECTED. The SARS-CoV-2 RNA is generally detectable in upper and lower  respiratory specimens during the acute phase of infection. The lowest  concentration of SARS-CoV-2 viral copies this assay can detect is 250  copies / mL. A negative result does not preclude SARS-CoV-2 infection  and should not be used as the sole basis for treatment or other  patient management decisions.  A negative result may occur with  improper specimen collection / handling, submission of specimen other  than nasopharyngeal swab, presence of viral mutation(s) within the  areas targeted by this assay, and inadequate number of viral copies  (<250 copies / mL). A negative result must be combined with clinical  observations, patient history, and epidemiological information. If result is POSITIVE SARS-CoV-2 target nucleic acids are DETECTED. The SARS-CoV-2 RNA is generally detectable in upper and lower  respiratory specimens dur ing the acute phase of infection.  Positive  results are indicative of active infection with SARS-CoV-2.  Clinical  correlation with patient history and other diagnostic information is  necessary to determine patient infection status.  Positive results do  not rule out bacterial infection or co-infection with other viruses. If result is PRESUMPTIVE POSTIVE SARS-CoV-2 nucleic acids MAY BE PRESENT.   A presumptive positive result was obtained on the submitted specimen  and confirmed on repeat  testing.  While 2019 novel coronavirus  (SARS-CoV-2) nucleic acids may be present in the submitted sample  additional confirmatory testing may be necessary for epidemiological  and / or clinical management purposes  to differentiate between  SARS-CoV-2 and other Sarbecovirus currently known to infect humans.  If clinically indicated additional testing with an alternate test  methodology [phone]) is advised. The SARS-CoV-2 RNA is generally  detectable in upper and lower respiratory sp ecimens during the acute  phase of infection. The expected result is Negative. Fact Sheet for Patients:  StrictlyIdeas.no Fact Sheet for Healthcare Providers: BankingDealers.co.za This test is not yet approved or cleared by the [place] FDA and has been authorized for detection and/or diagnosis of SARS-CoV-2 by FDA under an Emergency Use Authorization (EUA).  This EUA will remain in effect (meaning this test can be used) for the duration of the COVID-19 declaration under Section 564(b)(1) of the Act, 21 U.S.C. section 360bbb-3(b)(1), unless the authorization is terminated or revoked sooner. Performed at [facility], [place] [address], [place], [place] [address]   MRSA PCR Screening     Status: None   Collection Time: 06/09/19  8:30 PM   Specimen: Nasopharyngeal  Result Value Ref Range Status   MRSA by PCR NEGATIVE NEGATIVE Final    Comment:        The GeneXpert MRSA Assay (FDA approved for NASAL specimens only), is one component of a comprehensive MRSA colonization surveillance program. It is not intended to diagnose MRSA infection nor to guide or monitor treatment for MRSA infections. Performed at [facility], [place] [address] [place], [place] [address]   Culture, respiratory (non-expectorated)     Status: None   Collection Time: 06/10/19  4:27 PM   Specimen: Tracheal Aspirate; Respiratory  Result Value Ref Range Status   Specimen  Description TRACHEAL ASPIRATE  Final   Special Requests NONE  Final   Gram Stain   Final    MODERATE WBC PRESENT,BOTH PMN AND MONONUCLEAR MODERATE GRAM POSITIVE COCCI IN PAIRS FEW GRAM POSITIVE COCCI IN CHAINS    Culture   Final    FEW Consistent with normal respiratory flora. Performed at  [facility], [address] [place], [place] [address]    Report Status 06/12/2019 FINAL  Final     Radiology Studies: No results found.  Scheduled Meds: . amLODipine  5 mg Oral Daily  . Chlorhexidine Gluconate Cloth  6 each Topical Daily  . feeding supplement (PRO-STAT SUGAR FREE 64)  30 mL Oral BID  . furosemide  20 mg Intravenous BID  . heparin injection (subcutaneous)  5,000 Units Subcutaneous Q8H  . influenza vac split quadrivalent PF  0.5 mL Intramuscular Tomorrow-1000  . mouth rinse  15 mL Mouth Rinse BID  . multivitamin with minerals  1 tablet Oral Daily  . sodium chloride flush  10-40 mL Intracatheter Q12H   Continuous Infusions: . sodium chloride 10 mL/hr at 06/13/19 2000    LOS: 6 days   Time spent: >35 minutes  Azucena FallenWilliam C Macarena Langseth, DO [facility]  If 7PM-7AM, please contact night-coverage [email or web address] Password [email or web address] 06/15/2019, 7:37 AM

## 2019-06-15 NOTE — TOC Progression Note (Signed)
Transition of Care [facility]) - Progression Note    Patient Details  Name: Jeffrey Skinner MRN: [id number] Date of Birth: 14-Oct-1955  Transition of Care [facility]) CM/SW [place], [place] Phone Number: 06/15/2019, 12:52 PM  Clinical Narrative:     CSW has reached out to [employer] for bariatric walker to be ordered for patient at [phone], pending response.   Expected Discharge Plan: [place] Barriers to Discharge: Continued Medical Work up  Expected Discharge Plan and Services Expected Discharge Plan: [place] arrangements for the past 2 months: Single Family Home                           HH Arranged: PT [place]: [facility] [employer] Date [place]: 06/14/19 Time [place]: 1443 Representative spoke with at [place]: [place] ([place]) Interventions    Readmission Risk Interventions No flowsheet data found.

## 2019-06-16 LAB — CBC
HCT: 45.1 % (ref 39.0–52.0)
Hemoglobin: 12.9 g/dL — ABNORMAL LOW (ref 13.0–17.0)
MCH: 27.5 pg (ref 26.0–34.0)
MCHC: 28.6 g/dL — ABNORMAL LOW (ref 30.0–36.0)
MCV: 96.2 fL (ref 80.0–100.0)
Platelets: 191 10*3/uL (ref 150–400)
RBC: 4.69 MIL/uL (ref 4.22–5.81)
RDW: 15.9 % — ABNORMAL HIGH (ref 11.5–15.5)
WBC: 7.8 10*3/uL (ref 4.0–10.5)
nRBC: 0 % (ref 0.0–0.2)

## 2019-06-16 LAB — BASIC METABOLIC PANEL
Anion gap: 10 (ref 5–15)
BUN: 10 mg/dL (ref 8–23)
CO2: 35 mmol/L — ABNORMAL HIGH (ref 22–32)
Calcium: 8.5 mg/dL — ABNORMAL LOW (ref 8.9–10.3)
Chloride: 96 mmol/L — ABNORMAL LOW (ref 98–111)
Creatinine, Ser: 0.74 mg/dL (ref 0.61–1.24)
GFR calc Af Amer: >60 mL/min (ref >60)
GFR calc non Af Amer: >60 mL/min (ref >60)
Glucose, Bld: 101 mg/dL — ABNORMAL HIGH (ref 70–99)
Potassium: 4 mmol/L (ref 3.5–5.1)
Sodium: 141 mmol/L (ref 135–145)

## 2019-06-16 MED ORDER — FUROSEMIDE 10 MG/ML IJ SOLN
40.0000 mg | Freq: Two times a day (BID) | INTRAMUSCULAR | Status: AC
  Administered 2019-06-16 – 2019-06-17 (×4): 40 mg via INTRAVENOUS
  Filled 2019-06-16 (×4): qty 4

## 2019-06-16 MED ORDER — SODIUM CHLORIDE 0.9% FLUSH
3.0000 mL | Freq: Two times a day (BID) | INTRAVENOUS | Status: DC
  Administered 2019-06-16 – 2019-06-18 (×6): 3 mL via INTRAVENOUS

## 2019-06-16 MED ORDER — SODIUM CHLORIDE 0.9 % IV SOLN: INTRAVENOUS | Status: DC | PRN

## 2019-06-16 NOTE — Progress Notes (Signed)
PROGRESS NOTE    Jeffrey Skinner  VVO:[id number] DOB: 12-31-1955 DOA: 06/09/2019 PCP: Patient, No Pcp Per   Brief Narrative:  63 y.o. M with PMH of obesity and OSA who presented to the ED with worsening of his chronic SOB after doing some yard work.  He was initially satting well on room air, but had worsening respiratory failure in the ED that required Bipap and then intubation due to severe hypercarbic respiratory failure. History of admission for respiratory failure in 2018 where he was diagnosed with multi-factorial hypoxic respiratory failure likely secondary to undiagnosed COPD, OSA and obesity hypoventilation syndrome.  Has not been able to cope with weight loss for psychiatric reasons.  He was admitted to the ICU for ventilator support.  Assessment & Plan:   Active Problems:   Acute respiratory failure with hypoxia and hypercapnia (HCC)   Acute on chronic respiratory failure with hypoxia and hypercapnia (HCC)   Acute diastolic CHF (congestive heart failure) (HCC)   Acute on chronic hypercapnic and hypoxic respiratory failure, POA, multifactorial OSA/OHS Heart failure with preserved ejection fraction, diastolic dysfunction EF 60 to 65% -Continues to be hypoxic requiring upwards of 5 L nasal cannula at rest; baseline oxygen requirements at home are room air even with exertion -Unclear if heart failure is a new diagnosis for the patient -previous echocardiogram here 2018 unremarkable -Suspect coexisting COPD although apparently not supported when he had [place] done at the [place] aggressive diuresis, net -6.3 L -Increase Lasix 40 IV twice daily x4 doses -Ambulatory oxygen screening today patient remains hypoxic on room air but was able to ambulate on 2 L nasal cannula with sats in the upper 90s -Hoping that with continued diuresis patient will be able to discharge home without needing oxygen in the next 24 to 48 hours.  AKI, resolved -Now improved on home diuretic regimen  -Follow BMP -Hold off on adding back lisinopril for now -blood pressure currently well controlled  HTN, essential, well-controlled -holding home Lisinopril in the setting of AKI, aggressive diuresis and control blood pressure as above -Continues on home amlodipine 5 mg daily  Ambulatory dysfunction  -Likely the setting of morbid obesity as above, PT OT ongoing, likely disposition home with home health  Morbid obesity  -Lengthy discussion at bedside, patient understands that losing weight and having a more active lifestyle would greatly benefit his respiratory status -continues to work with PCP on dietary changes; has seen nutrition/dietary previously  Depression/Concern for suicidal ideation at presentation -ED reported to ICU nursing that pt mentioned he "did not want to live like this anymore" P: Dr. Lamonte Sakai previously evaluated.  He denies any suicidal ideation or intention to harm himself.    DVT prophylaxis: Heparin, per pharmacy recommendations Code Status: Full Disposition Plan: Pending clinical improvement, likely ultimate disposition home once able to ambulate without hypoxia on room air -suspect an additional 48 to 72 hours of diuresis will improve patient's ambulatory  and respiratory status.  Subjective: No acute issues or events overnight, denies chest pain, shortness of breath, nausea, vomiting, diarrhea, constipation, headache, fevers, chills.  Objective: Vitals:   06/15/19 1125 06/15/19 1626 06/15/19 2010 06/16/19 0443  BP: (!) 113/59 129/64 122/60 122/74  Pulse: 87 82 81 87  Resp: 17 20 20 18   Temp: 97.9 F (36.6 C) 98.5 F (36.9 C) 98.1 F (36.7 C)   TempSrc: Oral Oral Oral   SpO2: 95% 98% 93% 92%  Weight:      Height:  Intake/Output Summary (Last 24 hours) at 06/16/2019 0735 Last data filed at 06/16/2019 0509 Gross per 24 hour  Intake 1160 ml  Output 1650 ml  Net -490 ml   Filed Weights   06/13/19 2216 06/14/19 0503 06/15/19 0651  Weight: (!)  248.6 kg (!) 248 kg (!) 247 kg    Examination:  General: Morbidly obese gentleman pleasantly resting in bed, No acute distress. HEENT:  Normocephalic atraumatic.  Sclerae nonicteric, noninjected.  Extraocular movements intact bilaterally. Neck:  Without mass or deformity. Trachea is midline. Lungs:  Clear to auscultate bilaterally without rhonchi, wheeze, or rales. Heart:  Regular rate and rhythm.  Without murmurs, rubs, or gallops. Abdomen:  Soft, nontender, nondistended.  Without guarding or rebound. Extremities: Without cyanosis, clubbing, edema, or obvious deformity. Vascular:  Dorsalis pedis and posterior tibial pulses palpable bilaterally.  Data Reviewed: I have personally reviewed following labs and imaging studies  CBC: Recent Labs  Lab 06/09/19 1340  06/12/19 0013 06/13/19 0235 06/14/19 0759 06/15/19 0657 06/16/19 0422  WBC 9.5   < > 10.5 8.4 8.5 8.2 7.8  NEUTROABS 6.6  --   --   --   --   --   --   HGB 14.8   < > 13.4 13.1 13.6 13.2 12.9*  HCT 51.9   < > 45.7 45.0 47.2 45.6 45.1  MCV 97.9   < > 92.3 94.3 95.9 95.2 96.2  PLT 197   < > 188 183 192 187 191   < > = values in this interval not displayed.   Basic Metabolic Panel: Recent Labs  Lab 06/11/19 0417 06/11/19 0449 06/12/19 0013 06/13/19 0235 06/15/19 0657 06/16/19 0422  NA 142 140 142 141 140 141  K 3.7 3.3* 3.8 4.2 4.6 4.0  CL 98  --  100 98 95* 96*  CO2 32  --  31 34* 33* 35*  GLUCOSE 97  --  97 101* 101* 101*  BUN 19  --  18 12 12 10   CREATININE 1.24  --  1.05 0.83 0.72 0.74  CALCIUM 7.7*  --  7.7* 7.9* 8.3* 8.5*  MG 1.7  --   --  2.0  --   --    GFR: Estimated Creatinine Clearance: 207 mL/min (by C-G formula based on SCr of 0.74 mg/dL). Liver Function Tests: Recent Labs  Lab 06/09/19 1158 06/11/19 0417  AST 19 22  ALT 15 18  ALKPHOS 69 50  BILITOT 0.5 1.1  PROT 8.2* 6.2*  ALBUMIN 3.3* 2.3*   Coagulation Profile: Recent Labs  Lab 06/09/19 1311  INR 1.0   CBG: Recent Labs  Lab  06/11/19 0000 06/11/19 0359 06/11/19 0839 06/11/19 1215 06/11/19 1604  GLUCAP 109* 86 92 90 102*   Recent Results (from the past 240 hour(s))  SARS Coronavirus 2 [facility], [facility]formed in [facility]) Nasopharyngeal Nasopharyngeal Swab     Status: None   Collection Time: 06/09/19  2:40 PM   Specimen: Nasopharyngeal Swab  Result Value Ref Range Status   SARS Coronavirus 2 NEGATIVE NEGATIVE Final    Comment: (NOTE) If result is NEGATIVE SARS-CoV-2 target nucleic acids are NOT DETECTED. The SARS-CoV-2 RNA is generally detectable in upper and lower  respiratory specimens during the acute phase of infection. The lowest  concentration of SARS-CoV-2 viral copies this assay can detect is 250  copies / mL. A negative result does not preclude SARS-CoV-2 infection  and should not be used as the sole basis for treatment or  other  patient management decisions.  A negative result may occur with  improper specimen collection / handling, submission of specimen other  than nasopharyngeal swab, presence of viral mutation(s) within the  areas targeted by this assay, and inadequate number of viral copies  (<250 copies / mL). A negative result must be combined with clinical  observations, patient history, and epidemiological information. If result is POSITIVE SARS-CoV-2 target nucleic acids are DETECTED. The SARS-CoV-2 RNA is generally detectable in upper and lower  respiratory specimens dur ing the acute phase of infection.  Positive  results are indicative of active infection with SARS-CoV-2.  Clinical  correlation with patient history and other diagnostic information is  necessary to determine patient infection status.  Positive results do  not rule out bacterial infection or co-infection with other viruses. If result is PRESUMPTIVE POSTIVE SARS-CoV-2 nucleic acids MAY BE PRESENT.   A presumptive positive result was obtained on the submitted specimen  and confirmed on repeat  testing.  While 2019 novel coronavirus  (SARS-CoV-2) nucleic acids may be present in the submitted sample  additional confirmatory testing may be necessary for epidemiological  and / or clinical management purposes  to differentiate between  SARS-CoV-2 and other Sarbecovirus currently known to infect humans.  If clinically indicated additional testing with an alternate test  methodology [phone]) is advised. The SARS-CoV-2 RNA is generally  detectable in upper and lower respiratory sp ecimens during the acute  phase of infection. The expected result is Negative. Fact Sheet for Patients:  BoilerBrush.com.cy Fact Sheet for Healthcare Providers: https://pope.com/ This test is not yet approved or cleared by the [place] FDA and has been authorized for detection and/or diagnosis of SARS-CoV-2 by FDA under an Emergency Use Authorization (EUA).  This EUA will remain in effect (meaning this test can be used) for the duration of the COVID-19 declaration under Section 564(b)(1) of the Act, 21 U.S.C. section 360bbb-3(b)(1), unless the authorization is terminated or revoked sooner. Performed at [facility], [address] [place], [place] [address]   MRSA PCR Screening     Status: None   Collection Time: 06/09/19  8:30 PM   Specimen: Nasopharyngeal  Result Value Ref Range Status   MRSA by PCR NEGATIVE NEGATIVE Final    Comment:        The GeneXpert MRSA Assay (FDA approved for NASAL specimens only), is one component of a comprehensive MRSA colonization surveillance program. It is not intended to diagnose MRSA infection nor to guide or monitor treatment for MRSA infections. Performed at [facility], [address] [place], [place] [address]   Culture, respiratory (non-expectorated)     Status: None   Collection Time: 06/10/19  4:27 PM   Specimen: Tracheal Aspirate; Respiratory  Result Value Ref Range Status   Specimen  Description TRACHEAL ASPIRATE  Final   Special Requests NONE  Final   Gram Stain   Final    MODERATE WBC PRESENT,BOTH PMN AND MONONUCLEAR MODERATE GRAM POSITIVE COCCI IN PAIRS FEW GRAM POSITIVE COCCI IN CHAINS    Culture   Final    FEW Consistent with normal respiratory flora. Performed at [facility], [address], [place], [place] [address]    Report Status 06/12/2019 FINAL  Final     Radiology Studies: No results found.  Scheduled Meds: . amLODipine  5 mg Oral Daily  . feeding supplement (PRO-STAT SUGAR FREE 64)  30 mL Oral BID  . furosemide  40 mg Intravenous  BID  . heparin injection (subcutaneous)  5,000 Units Subcutaneous Q8H  . influenza vac split quadrivalent PF  0.5 mL Intramuscular Tomorrow-1000  . mouth rinse  15 mL Mouth Rinse BID  . multivitamin with minerals  1 tablet Oral Daily  . sodium chloride flush  3 mL Intravenous Q12H   Continuous Infusions: . sodium chloride 10 mL/hr at 06/13/19 2000  . sodium chloride      LOS: 7 days   Time spent: >35 minutes  Azucena FallenWilliam C Demeka Sutter, DO [facility]  If 7PM-7AM, please contact night-coverage [email or web address] Password [email or web address] 06/16/2019, 7:35 AM

## 2019-06-16 NOTE — Progress Notes (Signed)
Pt refused standing weight this AM, stating that it is too early for him to get up. Pt transferred from bed to recliner and back using walker last night as there were issues with his bariatric bed mattress inflation last night. Pt received a new bariatric bed last night, so bed weights no longer reliable (new bed showed a 55% weight loss). Pt to be weighed later this morning using a standing scale.

## 2019-06-16 NOTE — Progress Notes (Signed)
Physical Therapy Treatment Patient Details Name: Jeffrey Skinner MRN: [id number] DOB: 1956-01-30 Today's Date: 06/16/2019    History of Present Illness Pt adm with Acute on chronic hypercapnic and hypoxic respiratory failure and intubated 9/9-9/11. PMH - morbid obesity, HTN, OSA/OHS and chronic respiratory failure     PT Comments    Pt eager to work with therapy this afternoon and is making progress towards his goals. Pt is limited in safe mobility buy oxygen desaturation (see General Comments) in presence of core and LE weakness and decreased endurance. Pt is supervision for bed mobility and min guard for transfers and ambulation of 80 feet with RW. Pt laments fact that he has knee pain, with ambulation. Knee palpated and found to have deep tissue trigger points. Pt educated on need for increased exercise to improve strength. Pt instructed in sit>stand and surprised that he was able to perform without UE support. With encouragement pt able to perform 5 more times. With return to bed instructed pt in bridging exercise to improve core strength. D/c plans remain appropriate at this time.     Follow Up Recommendations  Home health PT;Supervision for mobility/OOB     Equipment Recommendations  Rolling walker with 5" wheels(bari)       Precautions / Restrictions Precautions Precautions: Fall Precaution Comments: watch sats Restrictions Weight Bearing Restrictions: No    Mobility  Bed Mobility Overal bed mobility: Needs Assistance Bed Mobility: Supine to Sit;Sit to Supine     Supine to sit: Supervision     General bed mobility comments: increased time and effort required for coming to EoB, heavy reliance on pulling against bedrail, benefitted from keeping bed inflated   Transfers Overall transfer level: Needs assistance   Transfers: Sit to/from Stand Sit to Stand: Min guard         General transfer comment: cues for hand placement for safety with pt able to stand from bari bed  with use of momentum   Ambulation/Gait Ambulation/Gait assistance: Min guard Gait Distance (Feet): 80 Feet Assistive device: Rolling walker (2 wheeled) Gait Pattern/deviations: Step-through pattern;Decreased stride length;Trunk flexed Gait velocity: decr Gait velocity interpretation: <1.31 ft/sec, indicative of household ambulator General Gait Details: wide BOS with use of bari RW, slow waddling gait, at 40 feet pt states "I've got to turn around." pt did not answer additional questions on return to room, once seated on EoB pt states "oh my knee hurt." advised pt to communicate with therapists in future          Balance Overall balance assessment: Mild deficits observed, not formally tested                                          Cognition Arousal/Alertness: Awake/alert Behavior During Therapy: WFL for tasks assessed/performed Overall Cognitive Status: Impaired/Different from baseline Area of Impairment: Safety/judgement;Problem solving                             Problem Solving: Slow processing        Exercises Low Level/ICU Exercises Stabilized Bridging: AROM;Both;5 reps;Supine Other Exercises Other Exercises: sit>stand x 6 without use of UE    General Comments General comments (skin integrity, edema, etc.): pt on 2L O2 via [place] on entry with SaO2 96%O2, does not utilize supplemental O2 at home so removed [place], [place] dropped to 80%O2, replaced [place]  and instructed in pursed lipped breathing, SaO2 rebounded to 92%O2, ambulated on 4L O2 due to historical O2 drop with ambualtion pt able to maintain SaO2 89%O2 with ambulation'      Pertinent Vitals/Pain Pain Assessment: 0-10 Pain Score: 7  Pain Location: R knee pain due to weakness Pain Descriptors / Indicators: Aching;Cramping Pain Intervention(s): Limited activity within patient's tolerance;Monitored during session;Repositioned           PT Goals (current goals can now be found in the care  plan section) Acute Rehab PT Goals PT Goal Formulation: With patient Time For Goal Achievement: 06/27/19 Potential to Achieve Goals: Good Progress towards PT goals: Progressing toward goals    Frequency    Min 3X/week      PT Plan Current plan remains appropriate       AM-PAC PT "6 Clicks" Mobility   Outcome Measure  Help needed turning from your back to your side while in a flat bed without using bedrails?: A Little Help needed moving from lying on your back to sitting on the side of a flat bed without using bedrails?: A Little Help needed moving to and from a bed to a chair (including a wheelchair)?: A Little Help needed standing up from a chair using your arms (e.g., wheelchair or bedside chair)?: A Little Help needed to walk in hospital room?: A Little Help needed climbing 3-5 steps with a railing? : A Lot 6 Click Score: 17    End of Session Equipment Utilized During Treatment: Oxygen Activity Tolerance: Patient limited by fatigue Patient left: in bed;with call bell/phone within reach Nurse Communication: Mobility status PT Visit Diagnosis: Other abnormalities of gait and mobility (R26.89);Muscle weakness (generalized) (M62.81)     Time: 0932-3557 PT Time Calculation (min) (ACUTE ONLY): 32 min  Charges:  $Gait Training: 8-22 mins $Therapeutic Exercise: 8-22 mins                     [place]iana Pechacek B. Migdalia Dk PT, DPT Acute Rehabilitation Services Pager [phone] Office [phone]    [facility] 06/16/2019, 5:32 PM

## 2019-06-17 DIAGNOSIS — I10 Essential (primary) hypertension: Secondary | ICD-10-CM

## 2019-06-17 DIAGNOSIS — G4733 Obstructive sleep apnea (adult) (pediatric): Secondary | ICD-10-CM

## 2019-06-17 DIAGNOSIS — N179 Acute kidney failure, unspecified: Secondary | ICD-10-CM

## 2019-06-17 DIAGNOSIS — E662 Morbid (severe) obesity with alveolar hypoventilation: Secondary | ICD-10-CM

## 2019-06-17 DIAGNOSIS — I5032 Chronic diastolic (congestive) heart failure: Secondary | ICD-10-CM

## 2019-06-17 DIAGNOSIS — Z6841 Body Mass Index (BMI) 40.0 and over, adult: Secondary | ICD-10-CM

## 2019-06-17 LAB — CBC
HCT: 45.9 % (ref 39.0–52.0)
Hemoglobin: 13 g/dL (ref 13.0–17.0)
MCH: 27.3 pg (ref 26.0–34.0)
MCHC: 28.3 g/dL — ABNORMAL LOW (ref 30.0–36.0)
MCV: 96.4 fL (ref 80.0–100.0)
Platelets: 186 10*3/uL (ref 150–400)
RBC: 4.76 MIL/uL (ref 4.22–5.81)
RDW: 15.9 % — ABNORMAL HIGH (ref 11.5–15.5)
WBC: 7.6 10*3/uL (ref 4.0–10.5)
nRBC: 0 % (ref 0.0–0.2)

## 2019-06-17 LAB — BASIC METABOLIC PANEL
Anion gap: 10 (ref 5–15)
BUN: 10 mg/dL (ref 8–23)
CO2: 34 mmol/L — ABNORMAL HIGH (ref 22–32)
Calcium: 8.3 mg/dL — ABNORMAL LOW (ref 8.9–10.3)
Chloride: 94 mmol/L — ABNORMAL LOW (ref 98–111)
Creatinine, Ser: 0.69 mg/dL (ref 0.61–1.24)
GFR calc Af Amer: >60 mL/min (ref >60)
GFR calc non Af Amer: >60 mL/min (ref >60)
Glucose, Bld: 108 mg/dL — ABNORMAL HIGH (ref 70–99)
Potassium: 4.9 mmol/L (ref 3.5–5.1)
Sodium: 138 mmol/L (ref 135–145)

## 2019-06-17 NOTE — Progress Notes (Signed)
Physical Therapy Treatment Patient Details Name: Jeffrey Skinner MRN: [id number] DOB: 1956/03/24 Today's Date: 06/17/2019    History of Present Illness Pt adm with Acute on chronic hypercapnic and hypoxic respiratory failure and intubated 9/9-9/11. PMH - morbid obesity, HTN, OSA/OHS and chronic respiratory failure     PT Comments    Pt was pleasant and motivated to participate in therapy. He ambulated in hallway 2x with min guard for safety. Pt with c/o SOB after each gait trial requiring several min to recover. Pt also reports pain in bil LEs and low back. Patient would benefit from continued skilled PT to maximize activity tolerance and functional independence. Will continue to follow acutely.     Follow Up Recommendations  Home health PT;Supervision for mobility/OOB     Equipment Recommendations  Rolling walker with 5" wheels(bari)    Recommendations for Other Services       Precautions / Restrictions Precautions Precautions: Fall Precaution Comments: watch sats Restrictions Weight Bearing Restrictions: No    Mobility  Bed Mobility Overal bed mobility: Needs Assistance Bed Mobility: Supine to Sit;Sit to Supine     Supine to sit: Supervision Sit to supine: Supervision   General bed mobility comments: increased time and effort required for coming to EoB, heavy reliance on pulling against bedrail, benefitted from keeping bed inflated   Transfers Overall transfer level: Needs assistance Equipment used: Rolling walker (2 wheeled) Transfers: Sit to/from Stand Sit to Stand: Min guard         General transfer comment: cues for hand placement for safety with pt able to stand from bari bed with use of momentum   Ambulation/Gait Ambulation/Gait assistance: Min guard Gait Distance (Feet): 150 Feet(150x1 130x1) Assistive device: Rolling walker (2 wheeled) Gait Pattern/deviations: Step-through pattern;Decreased stride length;Trunk flexed;Wide base of support     General  Gait Details: Pt ambulated 2x with min guard for safety and a seated rest break inbetween secondary to SOB. Pt with slow waddling gait and wide BOS   Stairs             Wheelchair Mobility    Modified Rankin (Stroke Patients Only)       Balance Overall balance assessment: Mild deficits observed, not formally tested                                          Cognition Arousal/Alertness: Awake/alert Behavior During Therapy: WFL for tasks assessed/performed Overall Cognitive Status: Within Functional Limits for tasks assessed                                        Exercises      General Comments General comments (skin integrity, edema, etc.): pt on 2L SpO2 during ambulation.      Pertinent Vitals/Pain Pain Assessment: Faces Faces Pain Scale: Hurts little more Pain Location: bil LEs and Low back Pain Descriptors / Indicators: Aching;Cramping Pain Intervention(s): Limited activity within patient's tolerance;Monitored during session    Home Living                      Prior Function            PT Goals (current goals can now be found in the care plan section) Acute Rehab PT Goals Patient Stated Goal: get better PT  Goal Formulation: With patient Time For Goal Achievement: 06/27/19 Potential to Achieve Goals: Good Progress towards PT goals: Progressing toward goals    Frequency    Min 3X/week      PT Plan Current plan remains appropriate    Co-evaluation              AM-PAC PT "6 Clicks" Mobility   Outcome Measure  Help needed turning from your back to your side while in a flat bed without using bedrails?: A Little Help needed moving from lying on your back to sitting on the side of a flat bed without using bedrails?: A Little Help needed moving to and from a bed to a chair (including a wheelchair)?: A Little Help needed standing up from a chair using your arms (e.g., wheelchair or bedside chair)?: A  Little Help needed to walk in hospital room?: A Little Help needed climbing 3-5 steps with a railing? : A Lot 6 Click Score: 17    End of Session Equipment Utilized During Treatment: Oxygen Activity Tolerance: Patient tolerated treatment well Patient left: in bed;with call bell/phone within reach Nurse Communication: Mobility status PT Visit Diagnosis: Other abnormalities of gait and mobility (R26.89);Muscle weakness (generalized) (M62.81)     Time: [id number] PT Time Calculation (min) (ACUTE ONLY): 34 min  Charges:  $Gait Training: 23-37 mins                     Kallie LocksHannah Sarai January, [place] Pager [phone] Acute Rehab   Sheral ApleyHannah E Emersyn Wyss 06/17/2019, 2:19 PM

## 2019-06-17 NOTE — Progress Notes (Addendum)
PROGRESS NOTE    Jeffrey Skinner  ZOX:[id number] DOB: [birth year] DOA: 06/09/2019 PCP: Patient, No Pcp Per   Brief Narrative:  63 year old BM PMHx morbid obesity, heart failure, COPD, chronic respiratory failure,   came to the emergency department today with shortness of breath more than his baseline and also because his family noticed he sounds funny today and recommended him to go to the ED.  He has chronic respiratory failure with hypoxia and per his wife he uses BiPAP at night. He drove to emergency department by his own but when he got here, he had further difficulty and had hard time getting out of the car.  On his arrival, he found to be hypoxic and tachypnic and unable to talk full sentences and placed on BiPAP.  He then got altered and developed respiratory distress with hypoxia and his GCS decreased to 6 and got intubated.  Fist ABG: with respiratory acidosis with PH 7.151 PCO2 118, HCO3 39.4.  and His post intubation CXR with congestion (CXR not obtained at full inspiratory though) so he received IV Lasix for for volume overload and probable CHF. PCCM consulted for admission for acute on chronic respiratory failure with hypoxia and hypercapea.   Subjective: 9/17 A/O x4, states not on home O2 but has a tank at home.  Negative CP, negative S OB, negative N/V   Assessment & Plan:   Active Problems:   Acute respiratory failure with hypoxia and hypercapnia (HCC)   Acute on chronic respiratory failure with hypoxia and hypercapnia (HCC)   Acute diastolic CHF (congestive heart failure) (HCC)  Acute on chronic respiratory failure with hypoxia and hypercapnia - Ambulatory SPO2 pending   Acute on chronic hypercapnic and hypoxic respiratory failure, POA, multifactorial OSA/OHS Heart failure with preserved ejection fraction, diastolic dysfunction EF 60 to 81%65% -Continues to be hypoxic requiring upwards of 5 L nasal cannula at rest; baseline oxygen requirements at home are room air even  with exertion -Unclear if heart failure is a new diagnosis for the patient -previous echocardiogram here 2018 unremarkable -Suspect coexisting COPD although apparently not supported when [facility] -Continue aggressive diuresis, net -6.3 L -Increase Lasix 40 IV twice daily x4 doses -Ambulatory oxygen screening today patient remains hypoxic on room air but was able to ambulate on 2 L nasal cannula with sats in the upper 90s -Hoping that with continued diuresis patient will be able to discharge home without needing oxygen in the next 24 to 48 hours.  AKI, resolved -Now improved on home diuretic regimen -Follow BMP -Hold off on adding back lisinopril for now-blood pressure currently well controlled  HTN, essential, well-controlled -holding home Lisinopril in the setting of AKI, aggressive diuresis and control blood pressure as above -Continues on home amlodipine 5 mg daily  Ambulatory dysfunction  -Likely the setting of morbid obesity as above, PT OT ongoing, likely disposition home with home health  Super morbid obesity - 9/17 consult to nutrition, heart healthy diet, begin work-up for bariatric surgery. -9/17 2000 -calorie/day diet maximum.  -Lengthy discussion at bedside, patient understands that losing weight and having a more active lifestyle would greatly benefit his respiratory status -continues to work with PCP on dietary changes; has seen nutrition/dietary previously  Depression/Concern for suicidal ideation at presentation -ED reported to ICU nursing that pt mentioned he "did not want to live like this anymore" P: Dr. Delton CoombesByrum previously evaluated. He denies any suicidal ideation or intention to harm himself.    DVT  prophylaxis:  Code Status:  Family Communication:  Disposition Plan:    Consultants:    Procedures/Significant Events:     I have personally reviewed and interpreted all radiology studies and my findings are as above.   VENTILATOR SETTINGS:    Cultures   Antimicrobials:    Devices    LINES / TUBES:      Continuous Infusions: . sodium chloride 10 mL/hr at 06/13/19 2000  . sodium chloride       Objective: Vitals:   06/16/19 1556 06/16/19 2000 06/17/19 0549 06/17/19 0552  BP: 136/75 132/68  131/77  Pulse: 91 77  88  Resp: 18 20  18   Temp: 98.2 F (36.8 C) 98.2 F (36.8 C)  98.1 F (36.7 C)  TempSrc: Oral Oral  Oral  SpO2: 94% 98%  98%  Weight:   (!) 233.5 kg   Height:        Intake/Output Summary (Last 24 hours) at 06/17/2019 0751 Last data filed at 06/17/2019 0557 Gross per 24 hour  Intake 1320 ml  Output 3975 ml  Net -2655 ml   Filed Weights   06/15/19 0651 06/16/19 0900 06/17/19 0549  Weight: (!) 247 kg (!) 237.3 kg (!) 233.5 kg    Examination:  General: A/O x4, no acute respiratory distress Eyes: negative scleral hemorrhage, negative anisocoria, negative icterus ENT: Negative Runny nose, negative gingival bleeding, Neck:  Negative scars, masses, torticollis, lymphadenopathy, JVD Lungs: Clear to auscultation bilaterally without wheezes or crackles Cardiovascular: Regular rate and rhythm without murmur gallop or rub normal S1 and S2 Abdomen: Super morbid obesity, negative abdominal pain, nondistended, positive soft, bowel sounds, no rebound, no ascites, no appreciable mass Extremities: No significant cyanosis, clubbing, or edema bilateral lower extremities Skin: Negative rashes, lesions, ulcers Psychiatric:  Negative depression, negative anxiety, negative fatigue, negative mania  Central nervous system:  Cranial nerves II through XII intact, tongue/uvula midline, all extremities muscle strength 5/5, sensation intact negative dysarthria, negative expressive aphasia, negative receptive aphasia.  .     Data Reviewed: Care during the described time interval was provided by me .  I have reviewed this patient's available data, including medical history, events of note,  physical examination, and all test results as part of my evaluation.   CBC: Recent Labs  Lab 06/13/19 0235 06/14/19 0759 06/15/19 0657 06/16/19 0422 06/17/19 0516  WBC 8.4 8.5 8.2 7.8 7.6  HGB 13.1 13.6 13.2 12.9* 13.0  HCT 45.0 47.2 45.6 45.1 45.9  MCV 94.3 95.9 95.2 96.2 96.4  PLT 183 192 187 191 785   Basic Metabolic Panel: Recent Labs  Lab 06/11/19 0417  06/12/19 0013 06/13/19 0235 06/15/19 0657 06/16/19 0422 06/17/19 0516  NA 142   < > 142 141 140 141 138  K 3.7   < > 3.8 4.2 4.6 4.0 4.9  CL 98  --  100 98 95* 96* 94*  CO2 32  --  31 34* 33* 35* 34*  GLUCOSE 97  --  97 101* 101* 101* 108*  BUN 19  --  18 12 12 10 10   CREATININE 1.24  --  1.05 0.83 0.72 0.74 0.69  CALCIUM 7.7*  --  7.7* 7.9* 8.3* 8.5* 8.3*  MG 1.7  --   --  2.0  --   --   --    < > = values in this interval not displayed.   GFR: Estimated Creatinine Clearance: 199.6 mL/min (by C-G formula based on SCr of 0.69 mg/dL). Liver Function Tests:  Recent Labs  Lab 06/11/19 0417  AST 22  ALT 18  ALKPHOS 50  BILITOT 1.1  PROT 6.2*  ALBUMIN 2.3*   No results for input(s): LIPASE, AMYLASE in the last 168 hours. No results for input(s): AMMONIA in the last 168 hours. Coagulation Profile: No results for input(s): INR, PROTIME in the last 168 hours. Cardiac Enzymes: No results for input(s): CKTOTAL, CKMB, CKMBINDEX, TROPONINI in the last 168 hours. BNP (last 3 results) No results for input(s): PROBNP in the last 8760 hours. HbA1C: No results for input(s): HGBA1C in the last 72 hours. CBG: Recent Labs  Lab 06/11/19 0000 06/11/19 0359 06/11/19 0839 06/11/19 1215 06/11/19 1604  GLUCAP 109* 86 92 90 102*   Lipid Profile: No results for input(s): CHOL, HDL, LDLCALC, TRIG, CHOLHDL, LDLDIRECT in the last 72 hours. Thyroid Function Tests: No results for input(s): TSH, T4TOTAL, FREET4, T3FREE, THYROIDAB in the last 72 hours. Anemia Panel: No results for input(s): VITAMINB12, FOLATE, FERRITIN,  TIBC, IRON, RETICCTPCT in the last 72 hours. Urine analysis:    Component Value Date/Time   COLORURINE YELLOW 06/09/2019 1340   APPEARANCEUR CLEAR 06/09/2019 1340   LABSPEC 1.013 06/09/2019 1340   PHURINE 5.0 06/09/2019 1340   GLUCOSEU NEGATIVE 06/09/2019 1340   HGBUR NEGATIVE 06/09/2019 1340   BILIRUBINUR NEGATIVE 06/09/2019 1340   KETONESUR NEGATIVE 06/09/2019 1340   PROTEINUR NEGATIVE 06/09/2019 1340   NITRITE NEGATIVE 06/09/2019 1340   LEUKOCYTESUR NEGATIVE 06/09/2019 1340   Sepsis Labs: @LABRCNTIP (procalcitonin:4,lacticidven:4)  ) Recent Results (from the past 240 hour(s))  SARS Coronavirus 2 [facility] order, Performed in [facility] hospital lab) Nasopharyngeal Nasopharyngeal Swab     Status: None   Collection Time: 06/09/19  2:40 PM   Specimen: Nasopharyngeal Swab  Result Value Ref Range Status   SARS Coronavirus 2 NEGATIVE NEGATIVE Final    Comment: (NOTE) If result is NEGATIVE SARS-CoV-2 target nucleic acids are NOT DETECTED. The SARS-CoV-2 RNA is generally detectable in upper and lower  respiratory specimens during the acute phase of infection. The lowest  concentration of SARS-CoV-2 viral copies this assay can detect is 250  copies / mL. A negative result does not preclude SARS-CoV-2 infection  and should not be used as the sole basis for treatment or other  patient management decisions.  A negative result may occur with  improper specimen collection / handling, submission of specimen other  than nasopharyngeal swab, presence of viral mutation(s) within the  areas targeted by this assay, and inadequate number of viral copies  (<250 copies / mL). A negative result must be combined with clinical  observations, patient history, and epidemiological information. If result is POSITIVE SARS-CoV-2 target nucleic acids are DETECTED. The SARS-CoV-2 RNA is generally detectable in upper and lower  respiratory specimens dur ing the acute phase of infection.  Positive   results are indicative of active infection with SARS-CoV-2.  Clinical  correlation with patient history and other diagnostic information is  necessary to determine patient infection status.  Positive results do  not rule out bacterial infection or co-infection with other viruses. If result is PRESUMPTIVE POSTIVE SARS-CoV-2 nucleic acids MAY BE PRESENT.   A presumptive positive result was obtained on the submitted specimen  and confirmed on repeat testing.  While 2019 novel coronavirus  (SARS-CoV-2) nucleic acids may be present in the submitted sample  additional confirmatory testing may be necessary for epidemiological  and / or clinical management purposes  to differentiate between  SARS-CoV-2 and other Sarbecovirus currently known to infect  humans.  If clinically indicated additional testing with an alternate test  methodology [phone] is advised. The SARS-CoV-2 RNA is generally  detectable in upper and lower respiratory sp ecimens during the acute  phase of infection. The expected result is Negative. Fact Sheet for Patients:  BoilerBrush.com.cyhttps://www.fda.gov/media/136312/download Fact Sheet for Healthcare Providers: https://pope.com/https://www.fda.gov/media/136313/download This test is not yet approved or cleared by the [place] FDA and has been authorized for detection and/or diagnosis of SARS-CoV-2 by FDA under an Emergency Use Authorization (EUA).  This EUA will remain in effect (meaning this test can be used) for the duration of the COVID-19 declaration under Section 564(b)(1) of the Act, 21 U.S.C. section 360bbb-3(b)(1), unless the authorization is terminated or revoked sooner. Performed at [facility], [address] [place], [place] [address]   MRSA PCR Screening     Status: None   Collection Time: 06/09/19  8:30 PM   Specimen: Nasopharyngeal  Result Value Ref Range Status   MRSA by PCR NEGATIVE NEGATIVE Final    Comment:        The GeneXpert MRSA Assay (FDA approved for NASAL  specimens only), is one component of a comprehensive MRSA colonization surveillance program. It is not intended to diagnose MRSA infection nor to guide or monitor treatment for MRSA infections. Performed at [facility], [address] [place], [place] [address]   Culture, respiratory (non-expectorated)     Status: None   Collection Time: 06/10/19  4:27 PM   Specimen: Tracheal Aspirate; Respiratory  Result Value Ref Range Status   Specimen Description TRACHEAL ASPIRATE  Final   Special Requests NONE  Final   Gram Stain   Final    MODERATE WBC PRESENT,BOTH PMN AND MONONUCLEAR MODERATE GRAM POSITIVE COCCI IN PAIRS FEW GRAM POSITIVE COCCI IN CHAINS    Culture   Final    FEW Consistent with normal respiratory flora. Performed at [facility], [address] [place], [place] [address]    Report Status 06/12/2019 FINAL  Final         Radiology Studies: No results found.      Scheduled Meds: . amLODipine  5 mg Oral Daily  . feeding supplement (PRO-STAT SUGAR FREE 64)  30 mL Oral BID  . furosemide  40 mg Intravenous BID  . heparin injection (subcutaneous)  5,000 Units Subcutaneous Q8H  . influenza vac split quadrivalent PF  0.5 mL Intramuscular Tomorrow-1000  . mouth rinse  15 mL Mouth Rinse BID  . multivitamin with minerals  1 tablet Oral Daily  . sodium chloride flush  3 mL Intravenous Q12H   Continuous Infusions: . sodium chloride 10 mL/hr at 06/13/19 2000  . sodium chloride       LOS: 8 days   The patient is critically ill with multiple organ systems failure and requires high complexity decision making for assessment and support, frequent evaluation and titration of therapies, application of advanced monitoring technologies and extensive interpretation of multiple databases. Critical Care Time devoted to patient care services described in this note  Time spent: 40 minutes     WOODS, Roselind MessierURTIS J, MD [facility] Pager [phone]  If  7PM-7AM, please contact night-coverage [email or web address] Password [email or web address] 06/17/2019, 7:51 AM

## 2019-06-17 NOTE — Progress Notes (Signed)
Occupational Therapy Treatment Patient Details Name: Jeffrey Skinner MRN: [id number] DOB: 09-Sep-1956 Today's Date: 06/17/2019    History of present illness Pt adm with Acute on chronic hypercapnic and hypoxic respiratory failure and intubated 9/9-9/11. PMH - morbid obesity, HTN, OSA/OHS and chronic respiratory failure    OT comments  Pt progressing towards goals, agreeable to sink level grooming tasks focusing on standing balance, prolongued activity tolerance and transfers. Pt min guard for transfers today with Melodye PedBari Walker, supervision for bed mobility, and min guard for sink level grooming. Pt presents with increased O2 needs from 2-4L to maintain SpO2>90% with activity. OT will continue to follow acutely.   Follow Up Recommendations  Home health OT;Supervision - Intermittent    Equipment Recommendations  None recommended by OT    Recommendations for Other Services (counseling services)    Precautions / Restrictions Precautions Precautions: Fall Precaution Comments: watch sats Restrictions Weight Bearing Restrictions: No       Mobility Bed Mobility Overal bed mobility: Needs Assistance Bed Mobility: Supine to Sit;Sit to Supine     Supine to sit: Supervision Sit to supine: Supervision   General bed mobility comments: increased time and effort required for coming to EoB, heavy reliance on pulling against bedrail, benefitted from keeping bed inflated   Transfers Overall transfer level: Needs assistance Equipment used: Rolling walker (2 wheeled) Transfers: Sit to/from Stand Sit to Stand: Min guard         General transfer comment: cues for hand placement for safety with pt able to stand from bari bed with use of momentum     Balance Overall balance assessment: Mild deficits observed, not formally tested                                         ADL either performed or assessed with clinical judgement   ADL Overall ADL's : Needs assistance/impaired     Grooming: Wash/dry hands;Wash/dry face;Oral care;Min guard;Sitting;Standing Grooming Details (indicate cue type and reason): seated rest breaks between activities, SpO2 drops with activity, required increased O2 via [place] from 2-4L                 Toilet Transfer: Min guard;Requires wide/bariatric;RW Toilet Transfer Details (indicate cue type and reason): simulated in room         Functional mobility during ADLs: Min guard;Rolling walker General ADL Comments: minguard for safety; reinforced energy conservation education as previously started by OT     Vision       Perception     Praxis      Cognition Arousal/Alertness: Awake/alert Behavior During Therapy: WFL for tasks assessed/performed Overall Cognitive Status: Within Functional Limits for tasks assessed                                          Exercises     Shoulder Instructions       General Comments Pt on 2L O2 via  initially, with activity O2 saturations dropped from 93% to 85%; able to maintain saturations at 4L for activity >90%, able to return to 2L post sink level grooming and functional activity    Pertinent Vitals/ Pain       Pain Assessment: Faces Faces Pain Scale: Hurts little more Pain Location: bil LEs and Low back Pain Descriptors /  Indicators: Aching;Cramping Pain Intervention(s): Limited activity within patient's tolerance;Monitored during session  Home Living                                          Prior Functioning/Environment              Frequency  Min 2X/week        Progress Toward Goals  OT Goals(current goals can now be found in the care plan section)  Progress towards OT goals: Progressing toward goals  Acute Rehab OT Goals Patient Stated Goal: get better OT Goal Formulation: With patient Time For Goal Achievement: 06/28/19 Potential to Achieve Goals: Good  Plan Discharge plan remains appropriate;Frequency remains appropriate     Co-evaluation                 AM-PAC OT "6 Clicks" Daily Activity     Outcome Measure   Help from another person eating meals?: None Help from another person taking care of personal grooming?: A Little Help from another person toileting, which includes using toliet, bedpan, or urinal?: A Little Help from another person bathing (including washing, rinsing, drying)?: A Little Help from another person to put on and taking off regular upper body clothing?: A Little Help from another person to put on and taking off regular lower body clothing?: A Little 6 Click Score: 19    End of Session Equipment Utilized During Treatment: Rolling walker;Oxygen(2-4L O2)  OT Visit Diagnosis: Other abnormalities of gait and mobility (R26.89);Pain Pain - part of body: (lower back)   Activity Tolerance Patient tolerated treatment well   Patient Left in bed;with call bell/phone within reach   Nurse Communication Mobility status        Time: 1540-1605 OT Time Calculation (min): 25 min  Charges: OT General Charges $OT Visit: 1 Visit OT Treatments $Self Care/Home Management : 23-37 mins  Hulda Humphrey OTR/L Acute Rehabilitation Services Pager: [phone] Office: [place] 06/17/2019, 5:26 PM

## 2019-06-17 NOTE — Progress Notes (Signed)
Nutrition Follow-up  RD working remotely.  DOCUMENTATION CODES:   Morbid obesity  INTERVENTION:   -Continue double protein portions with meals -Continue MVI with minerals daily -Continue 30 ml Prostat BID, each supplement provides 100 kcals and 15 grams protein -Provided "Weight Loss Tips", "Weight Management Cooking Tips", and "Heart Healthy, Consistent Carbohydrate Nutrition Therapy" handouts from [facility] Nutrition Care Manual; handouts provided in AVS/ discharge summary -Provided pt with information regarding outpatient bariatric program; [facility] contact information added to AVS -Placed referral to [facility]  NUTRITION DIAGNOSIS:   Increased nutrient needs related to chronic illness as evidenced by estimated needs.  Ongoing  GOAL:   Patient will meet greater than or equal to 90% of their needs  Progressing   MONITOR:   PO intake, Supplement acceptance, Labs, Weight trends, Skin, I & O's  REASON FOR ASSESSMENT:   Consult Assessment of nutrition requirement/status  ASSESSMENT:   63 year old male who presented to the ED on 9/09 with SOB and LE swelling. PMH of respiratory failure with hypoxia, HTN, OSA/OHS, COPD, CHF. Pt required intubation in the ED. X-ray showing CHF.  9/11- self-extubated  Reviewed I/O's: -2.7 L x 24 hours and -9.5 L since admission  UOP: 4 L x 24 hours  Attempted to speak with pt via phone, however, no answer.   Pt continues to have good appetite; meal completion 85-100%. Pt taking Prostat supplements about 50% of the time.   Per chart review, pt has been working with PCP to implement lifestyle changes to facilitate weight loss. Obesity is chronic, complex medical condition which is best managed by a multi-disciplinary care team. Weight loss (other than intentional weight loss for diuresis) is not a desireable goal for an acute inpatient hospitalization. If further obesity  work-up is desired, recommend referral in outpatient bariatric service and/or [facility]. Md reports pt is interested in pursuing bariatric surgery.  Per CSW notes, plan for home with home health services once medically stable.   Labs reviewed.   Diet Order:   Diet Order            Diet Heart Room service appropriate? Yes; Fluid consistency: Thin  Diet effective now        Diet - low sodium heart healthy              EDUCATION NEEDS:   No education needs have been identified at this time  Skin:  Skin Assessment: Reviewed RN Assessment  Last BM:  06/16/19  Height:   Ht Readings from Last 1 Encounters:  06/12/19 6\' 4"  (1.93 m)    Weight:   Wt Readings from Last 1 Encounters:  06/17/19 (!) 233.5 kg    Ideal Body Weight:  94.5 kg  BMI:  Body mass index is 62.66 kg/m.  Estimated Nutritional Needs:   Kcal:  2150-2350  Protein:  140-155 grams  Fluid:  > 2.1 L    [place] A. Jimmye Norman, RD, LDN, [place] Registered Dietitian II Certified Diabetes Care and Education Specialist Pager: [phone] After hours Pager: [phone]

## 2019-06-17 NOTE — Progress Notes (Signed)
Mobility Specialist Criteria Algorithm Info.  SATURATION QUALIFICATIONS: (This note is used to comply with regulatory documentation for home oxygen)  Patient Saturations on Room Air at Rest = 90%  Patient Saturations on Room Air while Ambulating = 85%  Patient Saturations on 3 Liters of oxygen while Ambulating = 95%  Please briefly explain why patient needs home oxygen:   Mobility:  Activity: Ambulated in hall;Sat and stood x 3 Range of motion: Active;All extremities Level of assistance: Modified independent, requires aide device or extra time Assistive device: Front wheel walker Minutes stood: 5 minutes Minutes ambulated: 5 minutes Distance ambulated (ft): 240 ft Mobility response: Tolerated well Bed Position: Semi-fowlers   06/17/2019 3:23 PM

## 2019-06-17 NOTE — Progress Notes (Signed)
Patient refused CPAP/BIPAP at this time.

## 2019-06-17 NOTE — Discharge Instructions (Signed)
[facility]: [email or web address] OR [phone]  Weight Loss Tips General Tips Eat at least three times per day.  Pay attention to your body. When you feel like you have had enough to eat, stop. Quit before you feel full, stuffed, or sick from eating. You can have more if you are really hungry.  If you still feel hungry or unsatisfied after a meal or snack, wait at least 10 minutes before you have more food. Often, the craving will go away.  Drink plenty of calorie-free drinks (water, tea, coffee, diet soda). You may be thirsty, not hungry.  Pick lean meats, low-fat or nonfat cheese, and fat-free (skim) or low-fat (1%) milk instead of higher-fat/higher-calorie choices.  Get plenty of fiber. Vegetables, fruits, and whole grains are good sources. Have a high-fiber cereal every day.  Cut back on sugar. For example, drink less fruit juice and regular soda.  Limit the amount of alcohol (beer, wine, and liquor) that you drink.  Keep all food in the kitchen. Eat only in a chosen place, such as at the table. Dont eat in the car or the bedroom or in front of the TV. Food Preparation Plan meals ahead of time.  Try cooking methods that cut calories:  Cook without adding fat (bake, broil, roast, boil).  Use nonstick cooking sprays instead of butter or oil. You can also use wine, broth, or fruit juice instead of oil when cooking.  Use low-calorie foods instead of high-calorie ones when possible. Adriana Simas only what you need for one meal (dont make leftovers).  If you do make extra portions, put them away as soon as they are ready so you can save them for other meals. Store the leftovers in containers that you cant see through.  Cook when you are not hungry. For example, cook and refrigerate tomorrows dinner after you have finished eating tonight.  Make fruits, vegetables, and other low-calorie foods part of each meal.  Drink  water while you cook. Mealtimes Drink a glass of water before you eat. Drink more during meals.  Use smaller plates, bowls, glasses, and serving spoons.  Divide your plate into four equal parts. Use one part for meat, one for starch (such as pasta, rice, potatoes, or bread), and two for nonstarchy vegetables.  Do not put serving dishes on the table. This will make it harder to take a second portion.  Put salad dressing on the side instead of mixing it with or pouring onto your salad. Then dip your fork into the dressing before you spear a bite of salad.  Change your usual place at the table.  Make mealtime special by using pretty dishes, napkins, and glasses.  Eat slowly. Take a few 1-minute breaks from eating during meals. Put your fork down between bites. Cut your food one bite at a time.  Enjoy fruit for dessert instead of cake, pie, or other sweets.  Leave a little food on your plate. (You control the food; it doesnt control you.)  Remove your plate as soon as youve finished eating. Snacking Snacking can be part of your plan for healthy weight loss. You can eat six times per day as long as you plan what to eat and dont eat too many calories. Plan ahead. Be sure to have healthy snacks on hand. If the right food is not there, you may be more likely to eat whatever is available, such as candy, cookies, chips, leftovers, or other quick choices.  Keep low-calorie snacks  in a special part of the refrigerator. Good choices include the following:  Reduced-fat string cheese, low-calorie yogurt, and fat-free milk.  Washed, bite-size pieces of raw vegetables, such as carrots, celery, pepper strips, cucumbers, broccoli, and cauliflower. Serve with low-calorie dips.  Fresh fruit. Eating and Emotions Do you use eating to deal with feelings other than hunger, such as boredom, being tired, or stress? If you eat for these reasons, here are some other things you can try: Call a friend for support.  Use  inspirational quotes to help you avoid the temptation to eat.  Take a warm bath or shower.  Listen to music or a relaxation CD.  Take a walk.  Try activities that keep you from eating. For example, its hard to eat while youre exercising. If you are gardening, you probably wont eat while your hands are covered in soil.  Weight Management Cooking Tips  Carbohydrates Choose whole grains.  Reduce the amount of sugar in recipes; it can often be cut in half.  Use noncaloric sweeteners in drinks.  Substitute sucralose (Splenda) for sugar when baking.  Eat plenty of vegetables and fruits. They are high in fiber.  Add vegetables when stir-frying or to soups.  Dried peas, beans, and lentils are rich in fiber and are a good meat substitute. Proteins Choose fish and lean meats often. Meats labeled loin or round are leaner.  Cut away fat and remove skin from chicken and other poultry.  Choose reduced-fat cheeses.  When scrambling eggs, throw away some of the yolks to reduce the calories in your meal. All the fat and most of the calories are in the yolks.  Have some meatless meals. Substitute beans, egg whites, tofu, or texturized soy protein products for meat. Fats All fats and oils are high in calories. Use less when possible. Cut the amount of oil in half and substitute applesauce or fruit puree for the other half of the fat.  Select reduced-fat cheese and fat-free (skim) or low-fat (1%) milk products.  Flavor with lemon juice or herbs instead of butter, margarine, or oil.  Choose reduced-fat salad dressings and limit portion size. Instead of salad dressing, use vinegar or lemon juice with just a small amount of oil.  Use fat-free evaporated skim milk to replace whole milk in sauces.  Use a reduced-fat margarine spread instead of regular butter, margarine, or oil. Cooking Methods Saut foods in a small amount of wine, broth, or juice instead of using fat or oil.  Bake, broil, grill, roast,  or stew instead of frying in fat.  Place meat on a rack while cooking so grease will drain off.  Remove fat from cooked ground meat by draining on a rack or using a paper towel to soak up grease.  When making stews or soups, refrigerate the broth and skim hardened fat off the top before reheating and serving.  Use a nonstick cooking spray instead of butter, margarine, oil, or shortening.  Use a nonstick skillet. Less or no oil is required to prevent sticking.  Heart Healthy, Consistent Carbohydrate Nutrition Therapy   A heart-healthy and consistent carbohydrate diet is recommended to manage heart disease and diabetes. To follow a heart-healthy and consistent carbohydrate diet,  Eat a balanced diet with whole grains, fruits and vegetables, and lean protein sources.   Choose heart-healthy unsaturated fats. Limit saturated fats, trans fats, and cholesterol intake. Eat more plant-based or vegetarian meals using beans and soy foods for protein.   Eat whole, unprocessed foods  to limit the amount of sodium (salt) you eat.   Choose a consistent amount of carbohydrate at each meal and snack. Limit refined carbohydrates especially sugar, sweets and sugar-sweetened beverages.   If you drink alcohol, do so in moderation: one serving per day (women) and two servings per day (men). o One serving is equivalent to 12 ounces beer, 5 ounces wine, or 1.5 ounces distilled spirits  Tips Tips for Choosing Heart-Healthy Fats Choose lean protein and low-fat dairy foods to reduce saturated fat intake.  Saturated fat is usually found in animal-based protein and is associated with certain health risks. Saturated fat is the biggest contributor to raise low-density lipoprotein (LDL) cholesterol levels. Research shows that limiting saturated fat lowers unhealthy cholesterol levels. Eat no more than 7% of your total calories each day from saturated fat. Ask your RDN to help you determine how much saturated fat is right  for you.  There are many foods that do not contain large amounts of saturated fats. Swapping these foods to replace foods high in saturated fats will help you limit the saturated fat you eat and improve your cholesterol levels. You can also try eating more plant-based or vegetarian meals. Instead of Try:  Whole milk, cheese, yogurt, and ice cream 1% or skim milk, low-fat cheese, non-fat yogurt, and low-fat ice cream  Fatty, marbled beef and pork Lean beef, pork, or venison  Poultry with skin Poultry without skin  Butter, stick margarine Reduced-fat, whipped, or liquid spreads  Coconut oil, palm oil Liquid vegetable oils: corn, canola, olive, soybean and safflower oils   Avoid foods that contain trans fats.  Trans fats increase levels of LDL-cholesterol. Hydrogenated fat in processed foods is the main source of trans fats in foods.   Trans fats can be found in stick margarine, shortening, processed sweets, baked goods, some fried foods, and packaged foods made with hydrogenated oils. Avoid foods with partially hydrogenated oil on the ingredient list such as: cookies, pastries, baked goods, biscuits, crackers, microwave popcorn, and frozen dinners. Choose foods with heart healthy fats.  Polyunsaturated and monounsaturated fat are unsaturated fats that may help lower your blood cholesterol level when used in place of saturated fat in your diet.  Ask your RDN about taking a dietary supplement with plant sterols and stanols to help lower your cholesterol level.  Research shows that substituting saturated fats with unsaturated fats is beneficial to cholesterol levels. Try these easy swaps: Instead of Try:  Butter, stick margarine, or solid shortening Reduced-fat, whipped, or liquid spreads  Beef, pork, or poultry with skin Fish and seafood  Chips, crackers, snack foods Raw or unsalted nuts and seeds or nut butters Hummus with vegetables Avocado on toast  Coconut oil, palm oil Liquid  vegetable oils: corn, canola, olive, soybean and safflower oils  Limit the amount of cholesterol you eat to less than 200 milligrams per day.  Cholesterol is a substance carried through the bloodstream via lipoproteins, which are known as transporters of fat. Some body functions need cholesterol to work properly, but too much cholesterol in the bloodstream can damage arteries and build up blood vessel linings (which can lead to heart attack and stroke). You should eat less than 200 milligrams cholesterol per day.  People respond differently to eating cholesterol. There is no test available right now that can figure out which people will respond more to dietary cholesterol and which will respond less. For individuals with high intake of dietary cholesterol, different types of increase (none, small, moderate,  large) in LDL-cholesterol levels are all possible.   Food sources of cholesterol include egg yolks and organ meats such as liver, gizzards. Limit egg yolks to two to four per week and avoid organ meats like liver and gizzards to control cholesterol intake. Tips for Choosing Heart-Healthy Carbohydrates Consume a consistent amount of carbohydrate  It is important to eat foods with carbohydrates in moderation because they impact your blood glucose level. Carbohydrates can be found in many foods such as:  Grains (breads, crackers, rice, pasta, and cereals)   Starchy Vegetables (potatoes, corn, and peas)   Beans and legumes   Milk, soy milk, and yogurt   Fruit and fruit juice   Sweets (cakes, cookies, ice cream, jam and jelly)  Your RDN will help you set a goal for how many carbohydrate servings to eat at your meals and snacks. For many adults, eating 3 to 5 servings of carbohydrate foods at each meal and 1 or 2 carbohydrate servings for each snack works well.   Check your blood glucose level regularly. It can tell you if you need to adjust when you eat carbohydrates.  Choose foods rich  in viscous (soluble) fiber  Viscous, or soluble, is found in the walls of plant cells. Viscous fiber is found only in plant-based foods. Eating foods with fiber helps to lower your unhealthy cholesterol and keep your blood glucose in range   Rich sources of viscous fiber include vegetables (asparagus, Brussels sprouts, sweet potatoes, turnips) fruit (apricots, mangoes, oranges), legumes, and whole grains (barley, oats, and oat bran).   As you increase your fiber intake gradually, also increase the amount of water you drink. This will help prevent constipation.   If you have difficulty achieving this goal, ask your RDN about fiber laxatives. Choose fiber supplements made with viscous fibers such as psyllium seed husks or methylcellulose to help lower unhealthy cholesterol.   Limit refined carbohydrates   There are three types of carbohydrates: starches, sugar, and fiber. Some carbohydrates occur naturally in food, like the starches in rice or corn or the sugars in fruits and milk. Refined carbohydrates--foods with high amounts of simple sugars--can raise triglyceride levels. High triglyceride levels are associated with coronary heart disease.  Some examples of refined carbohydrate foods are table sugar, sweets, and beverages sweetened with added sugar. Tips for Reducing Sodium (Salt) Although sodium is important for your body to function, too much sodium can be harmful for people with high blood pressure. As sodium and fluid buildup in your tissues and bloodstream, your blood pressure increases. High blood pressure may cause damage to other organs and increase your risk for a stroke. Even if you take a pill for blood pressure or a water pill (diuretic) to remove fluid, it is still important to have less salt in your diet. Ask your doctor and RDN what amount of sodium is right for you.  Avoid processed foods. Eat more fresh foods.   Fresh fruits and vegetables are naturally low in sodium, as well  as frozen vegetables and fruits that have no added juices or sauces.   Fresh meats are lower in sodium than processed meats, such as bacon, sausage, and hotdogs. Read the nutrition label or ask your butcher to help you find a fresh meat that is low in sodium.  Eat less salt--at the table and when cooking.   A single teaspoon of table salt has 2,300 mg of sodium.   Leave the salt out of recipes for pasta, casseroles, and soups.  Ask your RDN how to cook your favorite recipes without sodium  Be a smart shopper.   Look for food packages that say salt-free or sodium-free. These items contain less than 5 milligrams of sodium per serving.   Very low-sodium products contain less than 35 milligrams of sodium per serving.   Low-sodium products contain less than 140 milligrams of sodium per serving.   Beware for Unsalted or No Added Salt products. These items may still be high in sodium. Check the nutrition label.  Add flavors to your food without adding sodium.   Try lemon juice, lime juice, fruit juice or vinegar.   Dry or fresh herbs add flavor. Try basil, bay leaf, dill, rosemary, parsley, sage, dry mustard, nutmeg, thyme, and paprika.   Pepper, red pepper flakes, and cayenne pepper can add spice t your meals without adding sodium. Hot sauce contains sodium, but if you use just a drop or two, it will not add up to much.   Buy a sodium-free seasoning blend or make your own at home. Additional Lifestyle Tips Achieve and maintain a healthy weight.  Talk with your RDN or your doctor about what is a healthy weight for you.  Set goals to reach and maintain that weight.   To lose weight, reduce your calorie intake along with increasing your physical activity. A weight loss of 10 to 15 pounds could reduce LDL-cholesterol by 5 milligrams per deciliter. Participate in physical activity.  Talk with your health care team to find out what types of physical activity are best for  you. Set a plan to get about 30 minutes of exercise on most days.  Foods Recommended Food Group Foods Recommended  Grains Whole grain breads and cereals, including whole wheat, barley, rye, buckwheat, corn, teff, quinoa, millet, amaranth, brown or wild rice, sorghum, and oats Pasta, especially whole wheat or other whole grain types  [employer] [place] TravelersBrown rice, quinoa or wild rice Whole grain crackers, bread, rolls, pitas Home-made bread with reduced-sodium baking soda  Protein Foods Lean cuts of beef and pork (loin, leg, round, extra lean hamburger)  Skinless Press photographerpoultry Fish Venison and other wild game Dried beans and peas Nuts and nut butters Meat alternatives made with soy or textured vegetable protein  Egg whites or egg substitute Cold cuts made with lean meat or soy protein  Dairy Nonfat (skim), low-fat, or 1%-fat milk  Nonfat or low-fat yogurt or cottage cheese Fat-free and low-fat cheese  Vegetables Fresh, frozen, or canned vegetables without added fat or salt   Fruits Fresh, frozen, canned, or dried fruit   Oils Unsaturated oils (corn, olive, peanut, soy, sunflower, canola)  Soft or liquid margarines and vegetable oil spreads  Salad dressings Seeds and nuts  Avocado   Foods Not Recommended Food Group Foods Not Recommended  Grains Breads or crackers topped with salt Cereals (hot or cold) with more than 300 mg sodium per serving Biscuits, cornbread, and other quick breads prepared with baking soda Bread crumbs or stuffing mix from a store High-fat bakery products, such as doughnuts, biscuits, croissants, danish pastries, pies, cookies Instant cooking foods to which you add hot water and stir--potatoes, noodles, rice, etc. Packaged starchy foods--seasoned noodle or rice dishes, stuffing mix, macaroni and cheese dinner Snacks made with partially hydrogenated oils, including chips, cheese puffs, snack mixes, regular crackers, butter-flavored popcorn  Protein Foods Higher-fat cuts of meats  (ribs, t-bone steak, regular hamburger) Bacon, sausage, or hot dogs Cold cuts, such as salami or bologna, deli meats, cured meats, corned  beef Organ meats (liver, brains, gizzards, sweetbreads) Poultry with skin Fried or smoked meat, poultry, and fish Whole eggs and egg yolks (more than 2-4 per week) Salted legumes, nuts, seeds, or nut/seed butters Meat alternatives with high levels of sodium (>300 mg per serving) or saturated fat (>5 g per serving)  Dairy Whole milk,?2% fat milk, buttermilk Whole milk yogurt or ice cream Cream Half-&-half Cream cheese Sour cream Cheese  Vegetables Canned or frozen vegetables with salt, fresh vegetables prepared with salt, butter, cheese, or cream sauce Fried vegetables Pickled vegetables such as olives, pickles, or sauerkraut  Fruits Fried fruits Fruits served with butter or cream  Oils Butter, stick margarine, shortening Partially hydrogenated oils or trans fats Tropical oils (coconut, palm, palm kernel oils)  Other Candy, sugar sweetened soft drinks and desserts Salt, sea salt, garlic salt, and seasoning mixes containing salt Bouillon cubes Ketchup, barbecue sauce, Worcestershire sauce, soy sauce, teriyaki sauce Miso Salsa Pickles, olives, relish   Heart Healthy Consistent Carbohydrate Vegetarian (Lacto-Ovo) Sample 1-Day Menu  Breakfast 1 cup oatmeal, cooked (2 carbohydrate servings)   cup blueberries (1 carbohydrate serving)  11 almonds, without salt  1 cup 1% milk (1 carbohydrate serving)  1 cup coffee  Morning Snack 1 cup fat-free plain yogurt (1 carbohydrate serving)  Lunch 1 whole wheat bun (1 carbohydrate servings)  1 black bean burger (1 carbohydrate servings)  1 slice cheddar cheese, low sodium  2 slices tomatoes  2 leaves lettuce  1 teaspoon mustard  1 small pear (1 carbohydrate servings)  1 cup green tea, unsweetened  Afternoon Snack 1/3 cup trail mix with nuts, seeds, and raisins, without salt (1 carbohydrate servinga)   Evening Meal  cup meatless chicken  2/3 cup brown rice, cooked (2 carbohydrate servings)  1 cup broccoli, cooked (2/3 carbohydrate serving)   cup carrots, cooked (1/3 carbohydrate serving)  2 teaspoons olive oil  1 teaspoon balsamic vinegar  1 whole wheat dinner roll (1 carbohydrate serving)  1 teaspoon margarine, soft, tub  1 cup 1% milk (1 carbohydrate serving)  Evening Snack 1 extra small banana (1 carbohydrate serving)  1 tablespoon peanut butter   Heart Healthy Consistent Carbohydrate Vegan Sample 1-Day Menu  Breakfast 1 cup oatmeal, cooked (2 carbohydrate servings)   cup blueberries (1 carbohydrate serving)  11 almonds, without salt  1 cup soymilk fortified with calcium, vitamin B12, and vitamin D  1 cup coffee  Morning Snack 6 ounces soy yogurt (1 carbohydrate servings)  Lunch 1 whole wheat bun(1 carbohydrate servings)  1 black bean burger (1 carbohydrate serving)  2 slices tomatoes  2 leaves lettuce  1 teaspoon mustard  1 small pear (1 carbohydrate servings)  1 cup green tea, unsweetened  Afternoon Snack 1/3 cup trail mix with nuts, seeds, and raisins, without salt (1 carbohydrate servings)  Evening Meal  cup meatless chicken  2/3 cup brown rice, cooked (2 carbohydrate servings)  1 cup broccoli, cooked (2/3 carbohydrate serving)   cup carrots, cooked (1/3 carbohydrate serving)  2 teaspoons olive oil  1 teaspoon balsamic vinegar  1 whole wheat dinner roll (1 carbohydrate serving)  1 teaspoon margarine, soft, tub  1 cup soymilk fortified with calcium, vitamin B12, and vitamin D  Evening Snack 1 extra small banana (1 carbohydrate serving)  1 tablespoon peanut butter    Heart Healthy Consistent Carbohydrate Sample 1-Day Menu  Breakfast 1 cup cooked oatmeal (2 carbohydrate servings)  3/4 cup blueberries (1 carbohydrate serving)  1 ounce almonds  1 cup skim milk (  1 carbohydrate serving)  1 cup coffee  Morning Snack 1 cup sugar-free nonfat yogurt (1  carbohydrate serving)  Lunch 2 slices whole-wheat bread (2 carbohydrate servings)  2 ounces lean [place]key breast  1 ounce low-fat Swiss cheese  1 teaspoon mustard  1 slice tomato  1 lettuce leaf  1 small pear (1 carbohydrate serving)  1 cup skim milk (1 carbohydrate serving)  Afternoon Snack 1 ounce trail mix with unsalted nuts, seeds, and raisins (1 carbohydrate serving)  Evening Meal 3 ounces salmon  2/3 cup cooked brown rice (2 carbohydrate servings)  1 teaspoon soft margarine  1 cup cooked broccoli with 1/2 cup cooked carrots (1 carbohydrate serving  Carrots, cooked, boiled, drained, without salt  1 cup lettuce  1 teaspoon olive oil with vinegar for dressing  1 small whole grain roll (1 carbohydrate serving)  1 teaspoon soft margarine  1 cup unsweetened tea  Evening Snack 1 extra-small banana (1 carbohydrate serving)  Copyright 2020  [employer]. All rights reserved.

## 2019-06-17 NOTE — TOC Progression Note (Signed)
Transition of Care [facility]) - Progression Note    Patient Details  Name: Jeffrey Skinner MRN: [id number] Date of Birth: 1956/05/28  Transition of Care [facility]) CM/SW [place], [place] Phone Number: [phone] 06/17/2019, 12:23 PM  Clinical Narrative:     CSW informed patient that bariatric walker is considered "specialized equipment" and not covered through his insurance. CSW informed patient he can be issued a hospital walker which is covered by his insurance IF he is 500 lbs our less. Otherwise ordering bariatric walker will be $150. Patient declined to order bariatric walker at this time and reports he would like a standing weight be taken at time of discharge to see if he is 500 lbs or less and could have a walker issued prior to discharge. RN made aware.   Expected Discharge Plan: [place] Barriers to Discharge: Continued Medical Work up  Expected Discharge Plan and Services Expected Discharge Plan: [place] arrangements for the past 2 months: Single Family Home                           HH Arranged: PT [place]: [facility] [employer] Date [place]: 06/14/19 Time [place]: 1443 Representative spoke with at [place]: [place] ([place]) Interventions    Readmission Risk Interventions No flowsheet data found.

## 2019-06-18 LAB — CBC
HCT: 46 % (ref 39.0–52.0)
Hemoglobin: 13.3 g/dL (ref 13.0–17.0)
MCH: 27.4 pg (ref 26.0–34.0)
MCHC: 28.9 g/dL — ABNORMAL LOW (ref 30.0–36.0)
MCV: 94.7 fL (ref 80.0–100.0)
Platelets: 198 10*3/uL (ref 150–400)
RBC: 4.86 MIL/uL (ref 4.22–5.81)
RDW: 16 % — ABNORMAL HIGH (ref 11.5–15.5)
WBC: 7.9 10*3/uL (ref 4.0–10.5)
nRBC: 0 % (ref 0.0–0.2)

## 2019-06-18 LAB — BASIC METABOLIC PANEL
Anion gap: 7 (ref 5–15)
BUN: 8 mg/dL (ref 8–23)
CO2: 39 mmol/L — ABNORMAL HIGH (ref 22–32)
Calcium: 8.6 mg/dL — ABNORMAL LOW (ref 8.9–10.3)
Chloride: 94 mmol/L — ABNORMAL LOW (ref 98–111)
Creatinine, Ser: 0.8 mg/dL (ref 0.61–1.24)
GFR calc Af Amer: >60 mL/min (ref >60)
GFR calc non Af Amer: >60 mL/min (ref >60)
Glucose, Bld: 102 mg/dL — ABNORMAL HIGH (ref 70–99)
Potassium: 3.8 mmol/L (ref 3.5–5.1)
Sodium: 140 mmol/L (ref 135–145)

## 2019-06-18 LAB — MAGNESIUM: Magnesium: 1.9 mg/dL (ref 1.7–2.4)

## 2019-06-18 MED ORDER — LISINOPRIL 5 MG PO TABS
2.5000 mg | ORAL_TABLET | Freq: Every day | ORAL | Status: DC
  Administered 2019-06-18 – 2019-06-19 (×2): 2.5 mg via ORAL
  Filled 2019-06-18 (×2): qty 1

## 2019-06-18 NOTE — TOC Progression Note (Addendum)
Transition of Care [facility]) - Progression Note    Patient Details  Name: Jeffrey Skinner MRN: [id number] Date of Birth: 1955-10-06  Transition of Care [facility]) CM/SW [place], [place] Phone Number: 06/18/2019, 4:20 PM  Clinical Narrative:     Patient unsure what company his home O2 was through, CSW named several companies patient reports he has no recollection.   CSW called [employer] and [employer], they report no record of patient in their system. CSW has reached out to [employer] multiple times to inquire if patient has used them in the past, no response.   Patient is being set up with [employer], they will bring patient oxygen to the room for transportation home and provide home O2 prior to his discharge tomorrow. Pending O2 DME orders.   Expected Discharge Plan: [place] Barriers to Discharge: Continued Medical Work up  Expected Discharge Plan and Services Expected Discharge Plan: [place] arrangements for the past 2 months: Single Family Home                           HH Arranged: PT [place]: [facility] [employer] Date [place]: 06/14/19 Time [place]: 1443 Representative spoke with at [place]: [place] ([place]) Interventions    Readmission Risk Interventions No flowsheet data found.

## 2019-06-18 NOTE — Progress Notes (Signed)
PROGRESS NOTE    Jeffrey Skinner  ZOX:[id number] DOB: 06-07-1956 DOA: 06/09/2019 PCP: Patient, No Pcp Per   Brief Narrative:  63 year old BM PMHx morbid obesity, heart failure, COPD, chronic respiratory failure,   came to the emergency department today with shortness of breath more than his baseline and also because his family noticed he sounds funny today and recommended him to go to the ED.  He has chronic respiratory failure with hypoxia and per his wife he uses BiPAP at night. He drove to emergency department by his own but when he got here, he had further difficulty and had hard time getting out of the car.  On his arrival, he found to be hypoxic and tachypnic and unable to talk full sentences and placed on BiPAP.  He then got altered and developed respiratory distress with hypoxia and his GCS decreased to 6 and got intubated.  Fist ABG: with respiratory acidosis with PH 7.151 PCO2 118, HCO3 39.4.  and His post intubation CXR with congestion (CXR not obtained at full inspiratory though) so he received IV Lasix for for volume overload and probable CHF. PCCM consulted for admission for acute on chronic respiratory failure with hypoxia and hypercapea.   Subjective: 9/18 A/O x4, negative CP, negative N/V, negative abdominal pain.  States he understands he will have to discharge on home O2.   Assessment & Plan:   Active Problems:   Acute respiratory failure with hypoxia and hypercapnia (HCC)   Acute on chronic respiratory failure with hypoxia and hypercapnia (HCC)   Acute diastolic CHF (congestive heart failure) (HCC)  Acute on chronic respiratory failure with hypoxia and hypercapnia -Multifactorial CHF, OSA/OHS, super morbid obesity. -Treat underlying problem - Ambulatory SPO2 pending Patient Saturations on Room Air at Rest = 86% Patient Saturations on Room Air while Ambulating = NT due to desaturation% Patient Saturations on 4 Liters of oxygen while Ambulating = 91% Please briefly  explain why patient needs home oxygen: This patient requires, supplemental oxygen to maintain saturations at or above 90%. - Patient to remain on 2 L O2 via [place] 24 hours/day.  Titrate to maintain SPO2> 89% -Provide [employer] O2 concentrator device  OSA/OHS  -Continue CPAP per respiratory therapy  Chronic diastolic CHF - See echocardiogram below -Strict in and out -11.2 L -Daily weight Filed Weights   06/16/19 0900 06/17/19 0549 06/18/19 0523  Weight: (!) 237.3 kg (!) 233.5 kg (!) 231.2 kg  -9/18 start lisinopril 2.5 mg daily -Continue amlodipine 5 mg daily -PCP to discontinue amlodipine and start patient on beta-blocker if tolerated.  Essential HTN -See CHF  Acute kidney injury -Negative acute kidney injury Recent Labs  Lab 06/13/19 0235 06/15/19 0657 06/16/19 0422 06/17/19 0516 06/18/19 0534  CREATININE 0.83 0.72 0.74 0.69 0.80    Super morbid obesity -9/17 2000 -calorie/day diet maximum. - Nutrition consult; the patient and instructed him on appropriate weight loss diet  Ambulatory dysfunction - Secondary to super morbid obesity. -We will discharge with home health and PT/OT  Depression/concern for suicidal ideation at presentation - 9/18 patient denies any suicidal/homicidal ideation   Depression/Concern for suicidal ideation at presentation -ED reported to ICU nursing that pt mentioned he "did not want to live like this anymore" P: Dr. Lamonte Sakai previously evaluated. He denies any suicidal ideation or intention to harm himself.  -9/18- suicidal/homicidal ideation   DVT prophylaxis: Subcu heparin Code Status: Full Family Communication: None Disposition Plan: Discharge 9/18   Consultants:  None  Procedures/Significant Events:  9/10 echocardiogram:left ventricle  EF = 60-65%. -moderate concentric left ventricular hypertrophy.  -Left ventricular diastolic Doppler parameters are consistent with impaired relaxation. -Right ventricle has moderately reduced  systolic function. The cavity was moderately enlarged.    I have personally reviewed and interpreted all radiology studies and my findings are as above.  VENTILATOR SETTINGS:    Cultures   Antimicrobials:    Devices    LINES / TUBES:      Continuous Infusions: . sodium chloride 10 mL/hr at 06/13/19 2000  . sodium chloride       Objective: Vitals:   06/17/19 1305 06/17/19 2200 06/17/19 2245 06/18/19 0523  BP: (!) 122/57 120/68  136/74  Pulse: 86 80  88  Resp: 18 17  17   Temp: 97.9 F (36.6 C) 98.2 F (36.8 C)  98 F (36.7 C)  TempSrc: Oral Oral  Oral  SpO2: 94% (!) 78% 93% 94%  Weight:    (!) 231.2 kg  Height:        Intake/Output Summary (Last 24 hours) at 06/18/2019 16100727 Last data filed at 06/17/2019 2236 Gross per 24 hour  Intake 479 ml  Output 2575 ml  Net -2096 ml   Filed Weights   06/16/19 0900 06/17/19 0549 06/18/19 0523  Weight: (!) 237.3 kg (!) 233.5 kg (!) 231.2 kg   Physical Exam:  General: A/O x4, positive acute on chronic respiratory distress Eyes: negative scleral hemorrhage, negative anisocoria, negative icterus ENT: Negative Runny nose, negative gingival bleeding, Neck:  Negative scars, masses, torticollis, lymphadenopathy, JVD Lungs: Clear to auscultation bilaterally without wheezes or crackles Cardiovascular: Regular rate and rhythm without murmur gallop or rub normal S1 and S2 Abdomen: Super morbid obesity negative abdominal pain, nondistended, positive soft, bowel sounds, no rebound, no ascites, no appreciable mass Extremities: No significant cyanosis, clubbing, or edema bilateral lower extremities Skin: Negative rashes, lesions, ulcers Psychiatric:  Negative depression, negative anxiety, negative fatigue, negative mania  Central nervous system:  Cranial nerves II through XII intact, tongue/uvula midline, all extremities muscle strength 5/5, sensation intact throughout, negative dysarthria, negative expressive aphasia, negative  receptive aphasia.  .     Data Reviewed: Care during the described time interval was provided by me .  I have reviewed this patient's available data, including medical history, events of note, physical examination, and all test results as part of my evaluation.   CBC: Recent Labs  Lab 06/14/19 0759 06/15/19 0657 06/16/19 0422 06/17/19 0516 06/18/19 0534  WBC 8.5 8.2 7.8 7.6 7.9  HGB 13.6 13.2 12.9* 13.0 13.3  HCT 47.2 45.6 45.1 45.9 46.0  MCV 95.9 95.2 96.2 96.4 94.7  PLT 192 187 191 186 198   Basic Metabolic Panel: Recent Labs  Lab 06/12/19 0013 06/13/19 0235 06/15/19 0657 06/16/19 0422 06/17/19 0516  NA 142 141 140 141 138  K 3.8 4.2 4.6 4.0 4.9  CL 100 98 95* 96* 94*  CO2 31 34* 33* 35* 34*  GLUCOSE 97 101* 101* 101* 108*  BUN 18 12 12 10 10   CREATININE 1.05 0.83 0.72 0.74 0.69  CALCIUM 7.7* 7.9* 8.3* 8.5* 8.3*  MG  --  2.0  --   --   --    GFR: Estimated Creatinine Clearance: 198.3 mL/min (by C-G formula based on SCr of 0.69 mg/dL). Liver Function Tests: No results for input(s): AST, ALT, ALKPHOS, BILITOT, PROT, ALBUMIN in the last 168 hours. No results for input(s): LIPASE, AMYLASE in the last 168 hours. No results for input(s): AMMONIA in the last 168 hours.  Coagulation Profile: No results for input(s): INR, PROTIME in the last 168 hours. Cardiac Enzymes: No results for input(s): CKTOTAL, CKMB, CKMBINDEX, TROPONINI in the last 168 hours. BNP (last 3 results) No results for input(s): PROBNP in the last 8760 hours. HbA1C: No results for input(s): HGBA1C in the last 72 hours. CBG: Recent Labs  Lab 06/11/19 0839 06/11/19 1215 06/11/19 1604  GLUCAP 92 90 102*   Lipid Profile: No results for input(s): CHOL, HDL, LDLCALC, TRIG, CHOLHDL, LDLDIRECT in the last 72 hours. Thyroid Function Tests: No results for input(s): TSH, T4TOTAL, FREET4, T3FREE, THYROIDAB in the last 72 hours. Anemia Panel: No results for input(s): VITAMINB12, FOLATE, FERRITIN, TIBC,  IRON, RETICCTPCT in the last 72 hours. Urine analysis:    Component Value Date/Time   COLORURINE YELLOW 06/09/2019 1340   APPEARANCEUR CLEAR 06/09/2019 1340   LABSPEC 1.013 06/09/2019 1340   PHURINE 5.0 06/09/2019 1340   GLUCOSEU NEGATIVE 06/09/2019 1340   HGBUR NEGATIVE 06/09/2019 1340   BILIRUBINUR NEGATIVE 06/09/2019 1340   KETONESUR NEGATIVE 06/09/2019 1340   PROTEINUR NEGATIVE 06/09/2019 1340   NITRITE NEGATIVE 06/09/2019 1340   LEUKOCYTESUR NEGATIVE 06/09/2019 1340   Sepsis Labs: @LABRCNTIP (procalcitonin:4,lacticidven:4)  ) Recent Results (from the past 240 hour(s))  SARS Coronavirus 2 [facility] order, Performed in [facility] hospital lab) Nasopharyngeal Nasopharyngeal Swab     Status: None   Collection Time: 06/09/19  2:40 PM   Specimen: Nasopharyngeal Swab  Result Value Ref Range Status   SARS Coronavirus 2 NEGATIVE NEGATIVE Final    Comment: (NOTE) If result is NEGATIVE SARS-CoV-2 target nucleic acids are NOT DETECTED. The SARS-CoV-2 RNA is generally detectable in upper and lower  respiratory specimens during the acute phase of infection. The lowest  concentration of SARS-CoV-2 viral copies this assay can detect is 250  copies / mL. A negative result does not preclude SARS-CoV-2 infection  and should not be used as the sole basis for treatment or other  patient management decisions.  A negative result may occur with  improper specimen collection / handling, submission of specimen other  than nasopharyngeal swab, presence of viral mutation(s) within the  areas targeted by this assay, and inadequate number of viral copies  (<250 copies / mL). A negative result must be combined with clinical  observations, patient history, and epidemiological information. If result is POSITIVE SARS-CoV-2 target nucleic acids are DETECTED. The SARS-CoV-2 RNA is generally detectable in upper and lower  respiratory specimens dur ing the acute phase of infection.  Positive  results are  indicative of active infection with SARS-CoV-2.  Clinical  correlation with patient history and other diagnostic information is  necessary to determine patient infection status.  Positive results do  not rule out bacterial infection or co-infection with other viruses. If result is PRESUMPTIVE POSTIVE SARS-CoV-2 nucleic acids MAY BE PRESENT.   A presumptive positive result was obtained on the submitted specimen  and confirmed on repeat testing.  While 2019 novel coronavirus  (SARS-CoV-2) nucleic acids may be present in the submitted sample  additional confirmatory testing may be necessary for epidemiological  and / or clinical management purposes  to differentiate between  SARS-CoV-2 and other Sarbecovirus currently known to infect humans.  If clinically indicated additional testing with an alternate test  methodology [phone]) is advised. The SARS-CoV-2 RNA is generally  detectable in upper and lower respiratory sp ecimens during the acute  phase of infection. The expected result is Negative. Fact Sheet for Patients:  BoilerBrush.com.cy Fact Sheet for Healthcare Providers:  https://pope.com/https://www.fda.gov/media/136313/download This test is not yet approved or cleared by the [place] FDA and has been authorized for detection and/or diagnosis of SARS-CoV-2 by FDA under an Emergency Use Authorization (EUA).  This EUA will remain in effect (meaning this test can be used) for the duration of the COVID-19 declaration under Section 564(b)(1) of the Act, 21 U.S.C. section 360bbb-3(b)(1), unless the authorization is terminated or revoked sooner. Performed at [facility], [address] [place], [place] [address]   MRSA PCR Screening     Status: None   Collection Time: 06/09/19  8:30 PM   Specimen: Nasopharyngeal  Result Value Ref Range Status   MRSA by PCR NEGATIVE NEGATIVE Final    Comment:        The GeneXpert MRSA Assay (FDA approved for NASAL specimens only),  is one component of a comprehensive MRSA colonization surveillance program. It is not intended to diagnose MRSA infection nor to guide or monitor treatment for MRSA infections. Performed at [facility], [address] [place], [place] [address]   Culture, respiratory (non-expectorated)     Status: None   Collection Time: 06/10/19  4:27 PM   Specimen: Tracheal Aspirate; Respiratory  Result Value Ref Range Status   Specimen Description TRACHEAL ASPIRATE  Final   Special Requests NONE  Final   Gram Stain   Final    MODERATE WBC PRESENT,BOTH PMN AND MONONUCLEAR MODERATE GRAM POSITIVE COCCI IN PAIRS FEW GRAM POSITIVE COCCI IN CHAINS    Culture   Final    FEW Consistent with normal respiratory flora. Performed at [facility], [address] [place], [place] [address]    Report Status 06/12/2019 FINAL  Final         Radiology Studies: No results found.      Scheduled Meds: . amLODipine  5 mg Oral Daily  . feeding supplement (PRO-STAT SUGAR FREE 64)  30 mL Oral BID  . heparin injection (subcutaneous)  5,000 Units Subcutaneous Q8H  . influenza vac split quadrivalent PF  0.5 mL Intramuscular Tomorrow-1000  . mouth rinse  15 mL Mouth Rinse BID  . multivitamin with minerals  1 tablet Oral Daily  . sodium chloride flush  3 mL Intravenous Q12H   Continuous Infusions: . sodium chloride 10 mL/hr at 06/13/19 2000  . sodium chloride       LOS: 9 days   The patient is critically ill with multiple organ systems failure and requires high complexity decision making for assessment and support, frequent evaluation and titration of therapies, application of advanced monitoring technologies and extensive interpretation of multiple databases. Critical Care Time devoted to patient care services described in this note  Time spent: 40 minutes     Otto Felkins, Roselind MessierURTIS J, MD [facility] Pager [phone]  If 7PM-7AM, please contact night-coverage [email or web address]  Password [email or web address] 06/18/2019, 7:27 AM

## 2019-06-18 NOTE — Progress Notes (Signed)
SATURATION QUALIFICATIONS: (This note is used to comply with regulatory documentation for home oxygen) late entry  Patient Saturations on Room Air at Rest = 86%  Patient Saturations on Room Air while Ambulating = NT due to desaturation%  Patient Saturations on 4 Liters of oxygen while Ambulating = 91%  Please briefly explain why patient needs home oxygen:  This patient requires, supplemental oxygen to maintain saturations at or above 90%.  Hulda Humphrey OTR/L Acute Rehabilitation Services Pager: [phone] Office: [phone]

## 2019-06-19 LAB — BASIC METABOLIC PANEL
Anion gap: 9 (ref 5–15)
BUN: 8 mg/dL (ref 8–23)
CO2: 36 mmol/L — ABNORMAL HIGH (ref 22–32)
Calcium: 8.8 mg/dL — ABNORMAL LOW (ref 8.9–10.3)
Chloride: 94 mmol/L — ABNORMAL LOW (ref 98–111)
Creatinine, Ser: 0.76 mg/dL (ref 0.61–1.24)
GFR calc Af Amer: >60 mL/min (ref >60)
GFR calc non Af Amer: >60 mL/min (ref >60)
Glucose, Bld: 102 mg/dL — ABNORMAL HIGH (ref 70–99)
Potassium: 3.6 mmol/L (ref 3.5–5.1)
Sodium: 139 mmol/L (ref 135–145)

## 2019-06-19 LAB — MAGNESIUM: Magnesium: 1.9 mg/dL (ref 1.7–2.4)

## 2019-06-19 LAB — CBC
HCT: 44.4 % (ref 39.0–52.0)
Hemoglobin: 13.2 g/dL (ref 13.0–17.0)
MCH: 27.6 pg (ref 26.0–34.0)
MCHC: 29.7 g/dL — ABNORMAL LOW (ref 30.0–36.0)
MCV: 92.7 fL (ref 80.0–100.0)
Platelets: 199 10*3/uL (ref 150–400)
RBC: 4.79 MIL/uL (ref 4.22–5.81)
RDW: 16 % — ABNORMAL HIGH (ref 11.5–15.5)
WBC: 7.1 10*3/uL (ref 4.0–10.5)
nRBC: 0 % (ref 0.0–0.2)

## 2019-06-19 MED ORDER — LISINOPRIL 2.5 MG PO TABS: 2.5000 mg | ORAL_TABLET | Freq: Every day | ORAL | 0 refills | Status: DC

## 2019-06-19 NOTE — Care Management (Signed)
Confirmed with [employer] that portable O2 for transport has been delivered to room, and concentrator will be set up today. Notified [place] that patient will DC today. No other CM needs identified.

## 2019-06-19 NOTE — Discharge Summary (Signed)
Physician Discharge Summary  Jeffrey Skinner ZOX:[id number] DOB: Feb 16, 1956 DOA: 06/09/2019  PCP: Patient, No Pcp Per  Admit date: 06/09/2019 Discharge date: 06/19/2019  Time spent: 30 minutes  Recommendations for Outpatient Follow-up:   Acute on chronic respiratory failure with hypoxia and hypercapnia -Multifactorial CHF, OSA/OHS, super morbid obesity. -Treat underlying problem - Ambulatory SPO2 pending Patient Saturations on Room Air at Rest =86% Patient Saturations on Room Air while Ambulating =NT due to desaturation% Patient Saturations on4Liters of oxygen while Ambulating = 91% Please briefly explain why patient needs home oxygen: This patient requires, supplemental oxygen to maintain saturations at or above 90%. - Patient to remain on 2 L O2 via [place] 24 hours/day.  Titrate to maintain SPO2> 89% -Provide Inogen O2 concentrator device  OSA/OHS  -Continue CPAP per respiratory therapy  Chronic diastolic CHF - See echocardiogram below -Strict in and out -11.6 L -Daily weight      Filed Weights   06/16/19 0900 06/17/19 0549 06/18/19 0523  Weight: (!) 237.3 kg (!) 233.5 kg (!) 231.2 kg  -9/18 start lisinopril 2.5 mg daily -Continue amlodipine 5 mg daily -Patient counseled to weigh himself daily and keep a journal.   -Will restart his home dose Lasix, to be monitored and adjusted by PCP -PCP to discontinue amlodipine and start patient on beta-blocker if tolerated.  Essential HTN -See CHF  Acute kidney injury -Negative acute kidney injury Recent Labs  Lab 06/15/19 0657 06/16/19 0422 06/17/19 0516 06/18/19 0534 06/19/19 0651  CREATININE 0.72 0.74 0.69 0.80 0.76   Super morbid obesity -9/17 2000 -calorie/day diet maximum. - Nutrition consult; the patient and instructed him on appropriate weight loss diet -PCP to enter patient into bariatric surgery program.  Ambulatory dysfunction - Secondary to super morbid obesity. -We will discharge with home health and  PT/OT  Depression/concern for suicidal ideation at presentation - 9/18 patient denies any suicidal/homicidal ideation   Depression/Concern for suicidal ideation at presentation -ED reported to ICU nursing that pt mentioned he "did not want to live like this anymore", however Dr. Delton Coombes previously evaluated. He denies any suicidal ideation or intention to harm himself.  -9/18- suicidal/homicidal ideation    Discharge Diagnoses:  Active Problems:   Acute respiratory failure with hypoxia and hypercapnia (HCC)   Acute on chronic respiratory failure with hypoxia and hypercapnia (HCC)   Acute diastolic CHF (congestive heart failure) (HCC)   Discharge Condition: Stable  Diet recommendation: Heart healthy  Filed Weights   06/17/19 0549 06/18/19 0523 06/19/19 0533  Weight: (!) 233.5 kg (!) 231.2 kg (!) 231 kg    History of present illness:  63 year old BM PMHx morbid obesity, heart failure, COPD, chronic respiratory failure,   came to the emergency department todaywith shortness of breathmore than his baseline and also because his family noticed he sounds funny today and recommended him to go tothe ED.  He has chronic respiratory failure with hypoxia andper his wife he uses BiPAP at night.He droveto emergency department byhis own but when he got here,he had furtherdifficultyand had hard timegetting out of the car.  On his arrival, hefound to be hypoxic and tachypnic and unable to talk full sentences and placed on BiPAP. He then got altered and developedrespiratory distress with hypoxia and his GCS decreased to 6 and gotintubated. Fist ABG: with respiratory acidosis with PH 7.151 PCO2 118, HCO3 39.4. and His post intubation CXR with congestion (CXR not obtained at full inspiratory though) so he received IV Lasix for for volume overload and probable CHF.  PCCM consulted for admission for acute on chronic respiratory failure with hypoxia and hypercapea.  Hospital  Course:  During his hospitalization patient was treated for Acute on chronic respiratory failure with hypoxia and hypercapnia Which was Multifactorial CHF, OSA/OHS, super morbid obesity.  With aggressive diuresis patient's respiratory status improved however patient still remains O2 dependent.  Patient also was afforded the opportunity speak with nutritionist in order to begin his weight loss program.  Stable for discharge  Procedures: 9/10 echocardiogram:left ventricle EF = 60-65%. -moderate concentric left ventricular hypertrophy.  -Left ventricular diastolic Doppler parameters are consistent with impaired relaxation. -Right ventricle has moderately reduced systolic function. The cavity was moderately enlarged.     Discharge Exam: Vitals:   06/19/19 0140 06/19/19 0520 06/19/19 0523 06/19/19 0533  BP: 106/62 (!) 150/87    Pulse: 89 86 80   Resp: 18 18    Temp: 98 F (36.7 C) 98.6 F (37 C)    TempSrc: Oral Oral    SpO2: (!) 82% (!) 82% 93%   Weight:    (!) 231 kg  Height:        General: A/O x4, positive acute on chronic respiratory distress Eyes: negative scleral hemorrhage, negative anisocoria, negative icterus ENT: Negative Runny nose, negative gingival bleeding, Neck:  Negative scars, masses, torticollis, lymphadenopathy, JVD Lungs: Clear to auscultation bilaterally without wheezes or crackles Cardiovascular: Regular rate and rhythm without murmur gallop or rub normal S1 and S2    Discharge Instructions  Discharge Instructions    Amb Referral to Nutrition and Diabetic E   Complete by: As directed    Diet - low sodium heart healthy   Complete by: As directed    Increase activity slowly   Complete by: As directed      Allergies as of 06/19/2019   No Known Allergies     Medication List    STOP taking these medications   HYDROcodone-acetaminophen 5-325 MG tablet Commonly known as: Norco   naproxen sodium 220 MG tablet Commonly known as: ALEVE     TAKE  these medications   albuterol 108 (90 Base) MCG/ACT inhaler Commonly known as: VENTOLIN HFA Inhale 2 puffs into the lungs every 6 (six) hours as needed for wheezing or shortness of breath.   amLODipine 5 MG tablet Commonly known as: NORVASC Take 5 mg by mouth daily.   furosemide 80 MG tablet Commonly known as: LASIX Take 80 mg by mouth daily.   ipratropium-albuterol 0.5-2.5 (3) MG/3ML Soln Commonly known as: DUONEB Take 3 mLs by nebulization every 6 (six) hours as needed for wheezing.   lisinopril 2.5 MG tablet Commonly known as: ZESTRIL Take 1 tablet (2.5 mg total) by mouth daily. What changed:   medication strength  how much to take   Tiotropium Bromide Monohydrate 1.25 MCG/ACT Aers Commonly known as: Spiriva Respimat Inhale 2 puffs into the lungs daily.            Durable Medical Equipment  (From admission, onward)         Start     Ordered   06/18/19 1712  For home use only DME oxygen  Once    Comments: Acute on chronic respiratory failure with hypoxia and hypercapnia - Ambulatory SPO2 pending Patient Saturations on Room Air at Rest = 86%  Patient Saturations on Room Air while Ambulating = NT due to desaturation%  Patient Saturations on 4 Liters of oxygen while Ambulating = 91%  Please briefly explain why patient needs home oxygen:  This  patient requires, supplemental oxygen to maintain saturations at or above 90%. - Patient to remain on 2 L O2 via [place] 24 hours/day.  Titrate to maintain SPO2> 89% -Provide Inogen O2 concentrator device  Question Answer Comment  Length of Need Lifetime   Mode or (Route) Nasal cannula   Liters per Minute 2   Frequency Continuous (stationary and portable oxygen unit needed)   Oxygen conserving device Yes   Oxygen delivery system Gas      06/18/19 1713   06/14/19 1323  For home use only DME high strength lightweight manual wheelchair with seat cushion  (Wheelchairs)  Once    Comments: Patient suffers from acute on  chronic ambulatory dysfunction, morbid obesity, chronic respiratory failure which impairs their ability to perform daily activities in the home.  A rolling walker will not resolve  issue with performing activities of daily living. A wheelchair will allow patient to safely perform daily activities.Wheel locks, extensions and anti-tippers.   06/14/19 1323         No Known Allergies Follow-up Information    [facility]. Go on 06/23/2019.   Specialty: Internal Medicine Why: @1 :20pm Contact information: [address] [place] [id number] [phone]           The results of significant diagnostics from this hospitalization (including imaging, microbiology, ancillary and laboratory) are listed below for reference.    Significant Diagnostic Studies: Dg Abd 1 View  Result Date: 06/09/2019 CLINICAL DATA:  OG tube placement EXAM: ABDOMEN - 1 VIEW COMPARISON:  None. FINDINGS: The tip of the orogastric tube tip is projecting over the mid stomach. IMPRESSION: Tip of the orogastric tube projecting over the mid stomach. Electronically Signed   By: Jonna ClarkBindu  Avutu M.D.   On: 06/09/2019 21:27   Dg Chest Port 1 View  Result Date: 06/11/2019 CLINICAL DATA:  Ventilator dependence EXAM: PORTABLE CHEST 1 VIEW COMPARISON:  Portable exam 0541 hours compared to 06/10/2019 FINDINGS: Tip of endotracheal tube projects 4.1 cm above carina. Nasogastric tube extends into abdomen. Enlargement of cardiac silhouette. Bibasilar opacities, appear to represent atelectasis on the RIGHT and atelectasis or infiltrate on the LEFT. No gross pleural effusion or pneumothorax. Lateral LEFT costophrenic angle excluded. IMPRESSION: RIGHT basilar atelectasis with persistent atelectasis versus consolidation at LEFT base. Electronically Signed   By: Ulyses SouthwardMark  Boles M.D.   On: 06/11/2019 08:31   Dg Chest Port 1 View  Result Date: 06/10/2019 CLINICAL DATA:  Respiratory failure EXAM: PORTABLE CHEST 1 VIEW  COMPARISON:  06/09/2019 FINDINGS: Endotracheal tube and gastric catheter are seen. Cardiac shadow is stable. Bilateral airspace opacities are again seen predominately within the bases on the current exam. No pneumothorax is noted. No bony abnormality is seen. IMPRESSION: Stable bibasilar airspace opacities again likely related to edema. Electronically Signed   By: Alcide CleverMark  Lukens M.D.   On: 06/10/2019 08:13   Dg Chest Port 1 View  Result Date: 06/09/2019 CLINICAL DATA:  Check endotracheal tube placement EXAM: PORTABLE CHEST 1 VIEW COMPARISON:  06/09/2019 FINDINGS: Endotracheal tube is noted in satisfactory position. Cardiac shadow is mildly prominent but stable. Central vascular congestion is again identified with increasing opacities bilaterally likely related to edema. No sizable effusion is seen. No bony abnormality is noted. IMPRESSION: Increasing vascular congestion and parenchymal opacities likely related to worsening edema. Electronically Signed   By: Alcide CleverMark  Lukens M.D.   On: 06/09/2019 16:04   Dg Chest Port 1 View  Result Date: 06/09/2019 CLINICAL DATA:  Shortness  of breath EXAM: PORTABLE CHEST 1 VIEW COMPARISON:  03/04/2018 FINDINGS: Endotracheal tube terminates approximately 0.7 cm above the carina. Heart is enlarged, unchanged. Lung volumes low. There is pulmonary vascular congestion and interstitial opacities bilaterally. No large pleural fluid collection. No pneumothorax. IMPRESSION: 1. Endotracheal tube terminates just above the level of the carina. Recommend retraction approximately 3 cm. 2. Findings suggestive of congestive heart failure with interstitial edema. Electronically Signed   By: Duanne GuessNicholas  Plundo M.D.   On: 06/09/2019 14:43   Vas [place] Lower Extremity Venous (dvt)  Result Date: 06/12/2019  Lower Venous Study Indications: Edema, and SOB.  Risk Factors: Chronic respiratory failure now requiring mechanical ventilation post admission. Limitations: Body habitus.498.96 lbs Performing  Technologist: Toma DeitersVirginia Slaughter RVS  Examination Guidelines: A complete evaluation includes B-mode imaging, spectral Doppler, color Doppler, and power Doppler as needed of all accessible portions of each vessel. Bilateral testing is considered an integral part of a complete examination. Limited examinations for reoccurring indications may be performed as noted.  +---------+---------------+---------+-----------+----------+-------------------+ RIGHT    CompressibilityPhasicitySpontaneityPropertiesThrombus Aging      +---------+---------------+---------+-----------+----------+-------------------+ CFV      Full           Yes      Yes                                      +---------+---------------+---------+-----------+----------+-------------------+ SFJ      Full                                                             +---------+---------------+---------+-----------+----------+-------------------+ FV Prox  Full           Yes      Yes                                      +---------+---------------+---------+-----------+----------+-------------------+ FV Mid   Full                                                             +---------+---------------+---------+-----------+----------+-------------------+ FV DistalFull           Yes      Yes                  Limited imaging due                                                       to Body habitus     +---------+---------------+---------+-----------+----------+-------------------+ PFV      Full           Yes      Yes                                      +---------+---------------+---------+-----------+----------+-------------------+  POP      Full           Yes      Yes                                      +---------+---------------+---------+-----------+----------+-------------------+ PTV                                                   Limited                                                                    visualiztion due to                                                       body habitus only                                                         visible at the                                                            distal leg with                                                           augmentation        +---------+---------------+---------+-----------+----------+-------------------+ PERO                                                  Limited                                                                   visualization due  to body habitus                                                           only visible at the                                                       distal leg with                                                           augmentation        +---------+---------------+---------+-----------+----------+-------------------+   +---------+---------------+---------+-----------+----------+-------------------+ LEFT     CompressibilityPhasicitySpontaneityPropertiesThrombus Aging      +---------+---------------+---------+-----------+----------+-------------------+ CFV      Full           Yes      Yes                                      +---------+---------------+---------+-----------+----------+-------------------+ SFJ      Full                                                             +---------+---------------+---------+-----------+----------+-------------------+ FV Prox  Full                                                             +---------+---------------+---------+-----------+----------+-------------------+ FV Mid                                                Not able to                                                               visualize well                                                             enough to evaluate  +---------+---------------+---------+-----------+----------+-------------------+ FV Distal  Not able to                                                               visualize well                                                            enough to evaluate  +---------+---------------+---------+-----------+----------+-------------------+ PFV      Full           Yes      Yes                  Limited                                                                   visualization       +---------+---------------+---------+-----------+----------+-------------------+ POP                                                   Not able to                                                               position the leg                                                          for adequate                                                              visualization       +---------+---------------+---------+-----------+----------+-------------------+ PTV                                                   Limited  visualiztion due to                                                       body habitus only                                                         able to visualize                                                         with augmentation                                                         at the distal leg   +---------+---------------+---------+-----------+----------+-------------------+ PERO                                                  Limited                                                                   visualiztion due to                                                       body habitus only                                                          able to visualize                                                         with augmentation                                                         at the distal leg   +---------+---------------+---------+-----------+----------+-------------------+  Summary: Right: There is no evidence of deep vein thrombosis in the lower extremity. However, portions of this examination were limited- see technologist comments above. No cystic structure found in the popliteal fossa. Inconclusive study due to body habitus Left: There is no evidence of deep vein thrombosis in the lower extremity. However, portions of this examination were limited- see technologist comments above. Unable evaluate for a cystic structure. Inconclusive study due to body habitus  *See table(s) above for measurements and observations. Electronically signed by Lemar Livings MD on 06/12/2019 at 12:17:48 AM.    Final     Microbiology: Recent Results (from the past 240 hour(s))  SARS Coronavirus 2 [facility] order, Performed in [facility] hospital lab) Nasopharyngeal Nasopharyngeal Swab     Status: None   Collection Time: 06/09/19  2:40 PM   Specimen: Nasopharyngeal Swab  Result Value Ref Range Status   SARS Coronavirus 2 NEGATIVE NEGATIVE Final    Comment: (NOTE) If result is NEGATIVE SARS-CoV-2 target nucleic acids are NOT DETECTED. The SARS-CoV-2 RNA is generally detectable in upper and lower  respiratory specimens during the acute phase of infection. The lowest  concentration of SARS-CoV-2 viral copies this assay can detect is 250  copies / mL. A negative result does not preclude SARS-CoV-2 infection  and should not be used as the sole basis for treatment or other  patient management decisions.  A negative result may occur with  improper specimen collection / handling, submission of specimen other  than nasopharyngeal swab, presence of viral mutation(s) within the  areas targeted by this assay, and inadequate number  of viral copies  (<250 copies / mL). A negative result must be combined with clinical  observations, patient history, and epidemiological information. If result is POSITIVE SARS-CoV-2 target nucleic acids are DETECTED. The SARS-CoV-2 RNA is generally detectable in upper and lower  respiratory specimens dur ing the acute phase of infection.  Positive  results are indicative of active infection with SARS-CoV-2.  Clinical  correlation with patient history and other diagnostic information is  necessary to determine patient infection status.  Positive results do  not rule out bacterial infection or co-infection with other viruses. If result is PRESUMPTIVE POSTIVE SARS-CoV-2 nucleic acids MAY BE PRESENT.   A presumptive positive result was obtained on the submitted specimen  and confirmed on repeat testing.  While 2019 novel coronavirus  (SARS-CoV-2) nucleic acids may be present in the submitted sample  additional confirmatory testing may be necessary for epidemiological  and / or clinical management purposes  to differentiate between  SARS-CoV-2 and other Sarbecovirus currently known to infect humans.  If clinically indicated additional testing with an alternate test  methodology [phone]) is advised. The SARS-CoV-2 RNA is generally  detectable in upper and lower respiratory sp ecimens during the acute  phase of infection. The expected result is Negative. Fact Sheet for Patients:  BoilerBrush.com.cy Fact Sheet for Healthcare Providers: https://pope.com/ This test is not yet approved or cleared by the [place] FDA and has been authorized for detection and/or diagnosis of SARS-CoV-2 by FDA under an Emergency Use Authorization (EUA).  This EUA will remain in effect (meaning this test can be used) for the duration of the COVID-19 declaration under Section 564(b)(1) of the Act, 21 U.S.C. section 360bbb-3(b)(1), unless the authorization is  terminated or revoked sooner. Performed at [facility], [address] [place], [place] [address]   MRSA PCR Screening     Status: None   Collection Time: 06/09/19  8:30 PM   Specimen: Nasopharyngeal  Result Value Ref Range Status   MRSA by PCR NEGATIVE NEGATIVE Final    Comment:        The GeneXpert MRSA Assay (FDA approved for NASAL specimens only), is one component of a comprehensive MRSA colonization surveillance program. It is not intended to diagnose MRSA infection nor to guide or monitor treatment for MRSA infections. Performed at [facility], [address], [place], [place] [address]   Culture, respiratory (non-expectorated)     Status: None   Collection Time: 06/10/19  4:27 PM   Specimen: Tracheal Aspirate; Respiratory  Result Value Ref Range Status   Specimen Description TRACHEAL ASPIRATE  Final   Special Requests NONE  Final   Gram Stain   Final    MODERATE WBC PRESENT,BOTH PMN AND MONONUCLEAR MODERATE GRAM POSITIVE COCCI IN PAIRS FEW GRAM POSITIVE COCCI IN CHAINS    Culture   Final    FEW Consistent with normal respiratory flora. Performed at [facility], [address] [place], [place] [address]    Report Status 06/12/2019 FINAL  Final     Labs: Basic Metabolic Panel: Recent Labs  Lab 06/13/19 0235 06/15/19 0657 06/16/19 0422 06/17/19 0516 06/18/19 0534 06/19/19 0651  NA 141 140 141 138 140 139  K 4.2 4.6 4.0 4.9 3.8 3.6  CL 98 95* 96* 94* 94* 94*  CO2 34* 33* 35* 34* 39* 36*  GLUCOSE 101* 101* 101* 108* 102* 102*  BUN 12 12 10 10 8 8   CREATININE 0.83 0.72 0.74 0.69 0.80 0.76  CALCIUM 7.9* 8.3* 8.5* 8.3* 8.6* 8.8*  MG 2.0  --   --   --  1.9 1.9   Liver Function Tests: No results for input(s): AST, ALT, ALKPHOS, BILITOT, PROT, ALBUMIN in the last 168 hours. No results for input(s): LIPASE, AMYLASE in the last 168 hours. No results for input(s): AMMONIA in the last 168 hours. CBC: Recent Labs  Lab 06/15/19 0657  06/16/19 0422 06/17/19 0516 06/18/19 0534 06/19/19 0651  WBC 8.2 7.8 7.6 7.9 7.1  HGB 13.2 12.9* 13.0 13.3 13.2  HCT 45.6 45.1 45.9 46.0 44.4  MCV 95.2 96.2 96.4 94.7 92.7  PLT 187 191 186 198 199   Cardiac Enzymes: No results for input(s): CKTOTAL, CKMB, CKMBINDEX, TROPONINI in the last 168 hours. BNP: BNP (last 3 results) Recent Labs    06/09/19 1326 06/13/19 0235  BNP 24.5 26.4    ProBNP (last 3 results) No results for input(s): PROBNP in the last 8760 hours.  CBG: No results for input(s): GLUCAP in the last 168 hours.     Signed:  Carolyne Littles, MD [facility] [phone] pager

## 2019-06-22 ENCOUNTER — Telehealth: Payer: Self-pay | Admitting: Family Medicine

## 2019-06-22 NOTE — Telephone Encounter (Signed)
Pt has an appt with Natalie on 06/23/19 please advise.

## 2019-06-23 ENCOUNTER — Ambulatory Visit (INDEPENDENT_AMBULATORY_CARE_PROVIDER_SITE_OTHER): Payer: 59 | Admitting: Family Medicine

## 2019-06-23 ENCOUNTER — Encounter: Payer: Self-pay | Admitting: Family Medicine

## 2019-06-23 ENCOUNTER — Other Ambulatory Visit: Payer: Self-pay

## 2019-06-23 VITALS — BP 109/62 | HR 88 | Temp 98.2°F | Ht 76.0 in | Wt >= 6400 oz

## 2019-06-23 DIAGNOSIS — Z09 Encounter for follow-up examination after completed treatment for conditions other than malignant neoplasm: Secondary | ICD-10-CM | POA: Diagnosis not present

## 2019-06-23 DIAGNOSIS — Z23 Encounter for immunization: Secondary | ICD-10-CM | POA: Diagnosis not present

## 2019-06-23 DIAGNOSIS — Z7689 Persons encountering health services in other specified circumstances: Secondary | ICD-10-CM

## 2019-06-23 DIAGNOSIS — E66813 Obesity, class 3: Secondary | ICD-10-CM

## 2019-06-23 DIAGNOSIS — R739 Hyperglycemia, unspecified: Secondary | ICD-10-CM

## 2019-06-23 DIAGNOSIS — Z0000 Encounter for general adult medical examination without abnormal findings: Secondary | ICD-10-CM

## 2019-06-23 DIAGNOSIS — R0602 Shortness of breath: Secondary | ICD-10-CM

## 2019-06-23 DIAGNOSIS — Z6841 Body Mass Index (BMI) 40.0 and over, adult: Secondary | ICD-10-CM

## 2019-06-23 NOTE — Progress Notes (Signed)
[facility]   New Patient--Hospital Follow Up--Establish Care  Subjective:  Patient ID: Jeffrey Skinner, male    DOB: 01-Apr-1956  Age: 63 y.o. MRN: [id number]  CC:  Chief Complaint  Patient presents with  . Hospitalization Follow-up    hospital follow up , having  trouble breathing , need rx of inhaler     HPI Jeffrey Skinner is a 63 year old male who presents for Hospital Follow Up today.   Past Medical History:  Diagnosis Date  . Acute diastolic CHF (congestive heart failure) (HCC) 06/14/2019  . Arthritis   . Depression   . Dyspnea   . Hypertension   . Obesity   . Sleep apnea    Current Status: This will be Mr. Jeffrey Skinner's initial office visit with me. He was non-compliance with his health for his PCP needs. Since his last office visit, he has had a is doing well with no complaints. He arrives today, via wheelchair. He is accompanied by his wife and son. He is currently on 2 liters of continuous oxygen. His anxiety is moderate today as r/t his health problems. He denies suicidal ideations, homicidal ideations, or auditory hallucinations.He denies visual changes, chest pain, cough, shortness of breath, heart palpitations, and falls. He has occasional headaches and dizziness with position changes. Denies severe headaches, confusion, seizures, double vision, and blurred vision, nausea and vomiting. He denies fevers, chills, fatigue, recent infections, weight loss, and night sweats. He has not had any visual changes, and falls. No chest pain, heart palpitations, cough and shortness of breath reported. No reports of GI problems such as diarrhea, and constipation. He has no reports of blood in stools, dysuria and hematuria. He denies pain today.   Past Surgical History:  Procedure Laterality Date  . APPENDECTOMY    . CHOLECYSTECTOMY    . HERNIA REPAIR    . VASECTOMY  1998    Family History  Problem Relation Age of Onset  . Rheum  arthritis Mother   . Arthritis Sister   . Cancer - Lung Brother   . Heart disease Brother     Social History   Socioeconomic History  . Marital status: Married    Spouse name: Jeffrey Skinner  . Number of children: 2  . Years of education: Not on file  . Highest education level: Not on file  Occupational History  . Not on file  Social Needs  . Financial resource strain: Not hard at all  . Food insecurity    Worry: Never true    Inability: Never true  . Transportation needs    Medical: No    Non-medical: No  Tobacco Use  . Smoking status: Never Smoker  . Smokeless tobacco: Never Used  Substance and Sexual Activity  . Alcohol use: Yes    Alcohol/week: 1.0 standard drinks    Types: 1 Shots of liquor per week    Comment: - once a month  . Drug use: No  . Sexual activity: Not Currently  Lifestyle  . Physical activity    Days per week: 0 days    Minutes per session: 0 min  . Stress: Rather much  Relationships  . Social connections    Talks on phone: More than three times a week    Gets together: Three times a week    Attends religious service: Never    Active member of club or organization: No    Attends meetings of clubs or  organizations: Never    Relationship status: Married  . Intimate partner violence    Fear of current or ex partner: No    Emotionally abused: No    Physically abused: No    Forced sexual activity: No  Other Topics Concern  . Not on file  Social History Narrative  . Not on file    Outpatient Medications Prior to Visit  Medication Sig Dispense Refill  . Acetaminophen (TYLENOL 8 HOUR PO) Take by mouth.    Marland Kitchen amLODipine (NORVASC) 5 MG tablet Take 5 mg by mouth daily.  0  . furosemide (LASIX) 80 MG tablet Take 80 mg by mouth daily.  0  . ipratropium-albuterol (DUONEB) 0.5-2.5 (3) MG/3ML SOLN Take 3 mLs by nebulization every 6 (six) hours as needed for wheezing.  0  . lisinopril (ZESTRIL) 2.5 MG tablet Take 1 tablet (2.5 mg total) by mouth daily. 30  tablet 0  . Respiratory Therapy Supplies (NEBULIZER) DEVI by Does not apply route.    . Tiotropium Bromide Monohydrate (SPIRIVA RESPIMAT) 1.25 MCG/ACT AERS Inhale 2 puffs into the lungs daily. 4 g 2  . albuterol (PROVENTIL HFA;VENTOLIN HFA) 108 (90 Base) MCG/ACT inhaler Inhale 2 puffs into the lungs every 6 (six) hours as needed for wheezing or shortness of breath. 1 Inhaler 2   No facility-administered medications prior to visit.     No Known Allergies  ROS Review of Systems  Constitutional: Negative.   HENT: Negative.   Eyes: Negative.   Respiratory: Positive for shortness of breath (occasional ).   Cardiovascular: Negative.   Gastrointestinal: Positive for abdominal distention (morbid obesity).  Endocrine: Negative.   Genitourinary: Negative.   Musculoskeletal: Positive for arthralgias (Generalized joint pain ).  Skin: Negative.   Allergic/Immunologic: Negative.   Neurological: Positive for dizziness (occasional ) and headaches (occasional ).  Hematological: Negative.   Psychiatric/Behavioral: Negative.       Objective:    Physical Exam  Constitutional: He is oriented to person, place, and time. He appears well-developed and well-nourished.  HENT:  Head: Normocephalic and atraumatic.  Eyes: Conjunctivae are normal.  Neck: Normal range of motion. Neck supple.  Cardiovascular: Normal rate, regular rhythm and intact distal pulses. Exam reveals gallop.  Pulmonary/Chest: Effort normal and breath sounds normal.  Abdominal: Soft. Bowel sounds are normal. He exhibits distension (morbid obesity).  Musculoskeletal:        General: Edema (extremity edema) present.  Neurological: He is alert and oriented to person, place, and time. He has normal reflexes.  Skin: Skin is warm and dry.  Psychiatric: He has a normal mood and affect. His behavior is normal. Judgment and thought content normal.  Nursing note and vitals reviewed.   BP 109/62 (BP Location: Left Arm, Patient Position:  Sitting, Cuff Size: Normal)   Pulse 88   Temp 98.2 F (36.8 C) (Oral)   Ht 6\' 4"  (1.93 m)   Wt (!) 510 lb (231.3 kg)   SpO2 95% Comment: pt is on 2L oxygen  BMI 62.08 kg/m  Wt Readings from Last 3 Encounters:  06/23/19 (!) 510 lb (231.3 kg)  06/19/19 (!) 509 lb 3.2 oz (231 kg)  03/04/18 (!) 475 lb (215.5 kg)     Health Maintenance Due  Topic Date Due  . Hepatitis C Screening  1955-10-11  . TETANUS/TDAP  10/16/1976    There are no preventive care reminders to display for this patient.  Lab Results  Component Value Date   TSH 2.692 01/09/2017   Lab  Results  Component Value Date   WBC 7.1 06/19/2019   HGB 13.2 06/19/2019   HCT 44.4 06/19/2019   MCV 92.7 06/19/2019   PLT 199 06/19/2019   Lab Results  Component Value Date   NA 139 06/19/2019   K 3.6 06/19/2019   CO2 36 (H) 06/19/2019   GLUCOSE 102 (H) 06/19/2019   BUN 8 06/19/2019   CREATININE 0.76 06/19/2019   BILITOT 1.1 06/11/2019   ALKPHOS 50 06/11/2019   AST 22 06/11/2019   ALT 18 06/11/2019   PROT 6.2 (L) 06/11/2019   ALBUMIN 2.3 (L) 06/11/2019   CALCIUM 8.8 (L) 06/19/2019   ANIONGAP 9 06/19/2019   Lab Results  Component Value Date   CHOL 157 01/09/2017   Lab Results  Component Value Date   HDL 40 (L) 01/09/2017   Lab Results  Component Value Date   LDLCALC 108 (H) 01/09/2017   Lab Results  Component Value Date   TRIG 69 06/12/2019   Lab Results  Component Value Date   CHOLHDL 3.9 01/09/2017   Lab Results  Component Value Date   HGBA1C 5.5 01/09/2017    Assessment & Plan:   1. Hospital discharge follow-up  2. Encounter to establish care  3. Need for influenza vaccination - Flu Vaccine QUAD 6+ mos PF IM (Fluarix Quad PF)  4. Class 3 severe obesity due to excess calories with serious comorbidity and body mass index (BMI) of 60.0 to 69.9 in adult [facility]) Body mass index is 62.08 kg/m.] Goal BMI  is <30. Encouraged efforts to reduce weight include engaging in physical activity as  tolerated with goal of 150 minutes per week. Improve dietary choices and eat a meal regimen consistent with a Mediterranean or DASH diet. Reduce simple carbohydrates. Do not skip meals and eat healthy snacks throughout the day to avoid over-eating at dinner. Set a goal weight loss that is achievable for you.  5. SOB (shortness of breath) Stable today. No signs or symptoms of respiratory distress noted or reported.   6. Hypocalcemia Most recent Calcium level is decreased at 8.8. Patient is to take daily MVI.   7. Hyperglycemia Hgb A1c is at 5.2 today. He will continue to decrease foods/beverages high in sugars and carbs and follow Heart Healthy or DASH diet. Increase physical activity to at least 30 minutes cardio exercise daily.   8. Healthcare maintenance - Lipid Panel - TSH - PSA - Vitamin B12 - Vitamin D, 25-hydroxy  9. Follow up He will follow up in 1 month.   No orders of the defined types were placed in this encounter.   Orders Placed This Encounter  Procedures  . Flu Vaccine QUAD 6+ mos PF IM (Fluarix Quad PF)  . Lipid Panel  . TSH  . PSA  . Vitamin B12  . Vitamin D, 25-hydroxy    Referral Orders  No referral(s) requested today    Kathe Becton,  MSN, FNP-BC [facility] [address] [place], [place] [address] [phone]- fax    Problem List Items Addressed This Visit      Other   SOB (shortness of breath)    Other Visit Diagnoses    Hospital discharge follow-up    -  Primary   Encounter to establish care       Need for influenza vaccination       Relevant Orders   Flu Vaccine QUAD 6+ mos PF IM (Fluarix Quad PF) (  Completed)   Class 3 severe obesity due to excess calories with serious comorbidity and body mass index (BMI) of 60.0 to 69.9 in adult [facility])       Hypocalcemia       Hyperglycemia       Healthcare maintenance       Relevant Orders   Lipid Panel   TSH    PSA   Vitamin B12   Vitamin D, 25-hydroxy      No orders of the defined types were placed in this encounter.   Follow-up: Return in about 1 month (around 07/23/2019).    Kallie Locks, FNP

## 2019-06-23 NOTE — Patient Instructions (Signed)

## 2019-06-23 NOTE — Telephone Encounter (Signed)
Called l/m for kate white kedrick [phone] give vebral okay per natalie

## 2019-06-24 ENCOUNTER — Encounter: Payer: Self-pay | Admitting: Family Medicine

## 2019-06-24 ENCOUNTER — Other Ambulatory Visit: Payer: Self-pay | Admitting: Family Medicine

## 2019-06-24 ENCOUNTER — Other Ambulatory Visit: Payer: Self-pay

## 2019-06-24 DIAGNOSIS — E559 Vitamin D deficiency, unspecified: Secondary | ICD-10-CM

## 2019-06-24 LAB — TSH: TSH: 2.42 u[IU]/mL (ref 0.450–4.500)

## 2019-06-24 LAB — POCT GLYCOSYLATED HEMOGLOBIN (HGB A1C)
HbA1c POC (<> result, manual entry): 5.2 % (ref 4.0–5.6)
HbA1c, POC (controlled diabetic range): 5.2 % (ref 0.0–7.0)
HbA1c, POC (prediabetic range): 5.2 % — AB (ref 5.7–6.4)
Hemoglobin A1C: 5.2 % (ref 4.0–5.6)

## 2019-06-24 LAB — VITAMIN D 25 HYDROXY (VIT D DEFICIENCY, FRACTURES): Vit D, 25-Hydroxy: 10.6 ng/mL — ABNORMAL LOW (ref 30.0–100.0)

## 2019-06-24 LAB — LIPID PANEL
Chol/HDL Ratio: 4.1 ratio (ref 0.0–5.0)
Cholesterol, Total: 161 mg/dL (ref 100–199)
HDL: 39 mg/dL — ABNORMAL LOW (ref >39)
LDL Chol Calc (NIH): 110 mg/dL — ABNORMAL HIGH (ref 0–99)
Triglycerides: 58 mg/dL (ref 0–149)
VLDL Cholesterol Cal: 12 mg/dL (ref 5–40)

## 2019-06-24 LAB — PSA: Prostate Specific Ag, Serum: 4 ng/mL (ref 0.0–4.0)

## 2019-06-24 LAB — VITAMIN B12: Vitamin B-12: 684 pg/mL (ref 232–1245)

## 2019-06-24 MED ORDER — IPRATROPIUM-ALBUTEROL 0.5-2.5 (3) MG/3ML IN SOLN: 3.0000 mL | Freq: Four times a day (QID) | RESPIRATORY_TRACT | 1 refills | Status: DC | PRN

## 2019-06-24 MED ORDER — SPIRIVA RESPIMAT 1.25 MCG/ACT IN AERS: 2.0000 | INHALATION_SPRAY | Freq: Every day | RESPIRATORY_TRACT | 2 refills | Status: DC

## 2019-06-24 MED ORDER — VITAMIN D (ERGOCALCIFEROL) 1.25 MG (50000 UNIT) PO CAPS: 50000.0000 [IU] | ORAL_CAPSULE | ORAL | 3 refills | Status: DC

## 2019-07-13 ENCOUNTER — Ambulatory Visit (INDEPENDENT_AMBULATORY_CARE_PROVIDER_SITE_OTHER): Payer: 59 | Admitting: Family Medicine

## 2019-07-13 ENCOUNTER — Other Ambulatory Visit: Payer: Self-pay

## 2019-07-13 ENCOUNTER — Encounter: Payer: Self-pay | Admitting: Family Medicine

## 2019-07-13 VITALS — BP 132/58 | Temp 98.8°F | Ht 76.0 in

## 2019-07-13 DIAGNOSIS — Z09 Encounter for follow-up examination after completed treatment for conditions other than malignant neoplasm: Secondary | ICD-10-CM | POA: Diagnosis not present

## 2019-07-13 DIAGNOSIS — M79672 Pain in left foot: Secondary | ICD-10-CM | POA: Diagnosis not present

## 2019-07-13 DIAGNOSIS — R0602 Shortness of breath: Secondary | ICD-10-CM

## 2019-07-13 MED ORDER — ALBUTEROL SULFATE HFA 108 (90 BASE) MCG/ACT IN AERS: 2.0000 | INHALATION_SPRAY | Freq: Four times a day (QID) | RESPIRATORY_TRACT | 12 refills | Status: DC | PRN

## 2019-07-13 NOTE — Progress Notes (Signed)
[facility]   Established Patient Office Visit  Subjective:  Patient ID: Jeffrey Skinner, male    DOB: Feb 14, 1956  Age: 63 y.o. MRN: [id number]  CC:  Chief Complaint  Patient presents with  . Edema    left and right foot swelling  . Right foot pain    Foot hit tub fixture on about 9 days ago    HPI Jeffrey Skinner is a 63 year old male who presents for Follow Up today.   Past Medical History:  Diagnosis Date  . Acute diastolic CHF (congestive heart failure) (HCC) 06/14/2019  . Arthritis   . Depression   . Dyspnea   . Hypertension   . Morbid obesity with BMI of 60.0-69.9, adult (HCC)   . Sleep apnea   . Vitamin D deficiency 06/2019     Current Status: Since his last office visit, he continues to have mild shortness of breath. He is accompanied by his son and mother today. He states that he recently had a fall nigne days ago, and injured his right foot. He is now experiencing pain that has not improved. He denies visual changes, chest pain, cough, heart palpitations, and falls. He has occasional headaches and dizziness with position changes. Denies severe headaches, confusion, seizures, double vision, and blurred vision, nausea and vomiting. His anxiety is mild today. He denies suicidal ideations, homicidal ideations, or auditory hallucinations.  He denies fevers, chills, fatigue, recent infections, weight loss, and night sweats.  No reports of GI problems such as diarrhea, and constipation. He has no reports of blood in stools, dysuria and hematuria.   Past Surgical History:  Procedure Laterality Date  . APPENDECTOMY    . CHOLECYSTECTOMY    . HERNIA REPAIR    . VASECTOMY  1998    Family History  Problem Relation Age of Onset  . Rheum arthritis Mother   . Arthritis Sister   . Cancer - Lung Brother   . Heart disease Brother     Social History   Socioeconomic History  . Marital status: Married    Spouse name: Erie Noe   . Number of children: 2  . Years of education: Not on file  . Highest education level: Not on file  Occupational History  . Not on file  Social Needs  . Financial resource strain: Not hard at all  . Food insecurity    Worry: Never true    Inability: Never true  . Transportation needs    Medical: No    Non-medical: No  Tobacco Use  . Smoking status: Never Smoker  . Smokeless tobacco: Never Used  Substance and Sexual Activity  . Alcohol use: Yes    Alcohol/week: 1.0 standard drinks    Types: 1 Shots of liquor per week    Comment: - once a month  . Drug use: No  . Sexual activity: Not Currently  Lifestyle  . Physical activity    Days per week: 0 days    Minutes per session: 0 min  . Stress: Rather much  Relationships  . Social connections    Talks on phone: More than three times a week    Gets together: Three times a week    Attends religious service: Never    Active member of club or organization: No    Attends meetings of clubs or organizations: Never    Relationship status: Married  . Intimate partner violence    Fear of current  or ex partner: No    Emotionally abused: No    Physically abused: No    Forced sexual activity: No  Other Topics Concern  . Not on file  Social History Narrative  . Not on file    Outpatient Medications Prior to Visit  Medication Sig Dispense Refill  . Acetaminophen (TYLENOL 8 HOUR PO) Take by mouth.    Marland Kitchen amLODipine (NORVASC) 5 MG tablet Take 5 mg by mouth daily.  0  . furosemide (LASIX) 80 MG tablet Take 80 mg by mouth daily.  0  . ipratropium-albuterol (DUONEB) 0.5-2.5 (3) MG/3ML SOLN Take 3 mLs by nebulization every 6 (six) hours as needed. 360 mL 1  . lisinopril (ZESTRIL) 2.5 MG tablet Take 1 tablet (2.5 mg total) by mouth daily. 30 tablet 0  . Respiratory Therapy Supplies (NEBULIZER) DEVI by Does not apply route.    . Tiotropium Bromide Monohydrate (SPIRIVA RESPIMAT) 1.25 MCG/ACT AERS Inhale 2 puffs into the lungs daily. 4 g 2  .  Vitamin D, Ergocalciferol, (DRISDOL) 1.25 MG (50000 UT) CAPS capsule Take 1 capsule (50,000 Units total) by mouth every 7 (seven) days. 5 capsule 3   No facility-administered medications prior to visit.     No Known Allergies  ROS Review of Systems  Constitutional: Negative.   HENT: Negative.   Eyes: Negative.   Respiratory: Positive for shortness of breath (occasional ).   Cardiovascular: Negative.   Gastrointestinal: Positive for abdominal distention.  Endocrine: Negative.   Genitourinary: Negative.   Musculoskeletal: Positive for arthralgias (generalized joint pain).  Skin: Negative.   Allergic/Immunologic: Negative.   Neurological: Positive for dizziness (occasional ) and headaches (occasional ).  Hematological: Negative.   Psychiatric/Behavioral: Negative.       Objective:    Physical Exam  Constitutional: He is oriented to person, place, and time. He appears well-developed and well-nourished.  HENT:  Head: Normocephalic and atraumatic.  Eyes: Conjunctivae are normal.  Neck: Normal range of motion. Neck supple.  Cardiovascular: Normal rate, regular rhythm, normal heart sounds and intact distal pulses.  Pulmonary/Chest: Effort normal and breath sounds normal.  Abdominal: Soft. Bowel sounds are normal. He exhibits distension (obese).  Musculoskeletal: Normal range of motion.  Neurological: He is alert and oriented to person, place, and time. He has normal reflexes.  Skin: Skin is warm and dry.  Psychiatric: He has a normal mood and affect. His behavior is normal. Judgment and thought content normal.  Nursing note and vitals reviewed.   BP (!) 132/58 (BP Location: Left Arm, Patient Position: Sitting, Cuff Size: Large)   Temp 98.8 F (37.1 C) (Oral)   Ht 6\' 4"  (1.93 m)   SpO2 98% Comment: on 2 liters  BMI 62.08 kg/m  Wt Readings from Last 3 Encounters:  06/23/19 (!) 510 lb (231.3 kg)  06/19/19 (!) 509 lb 3.2 oz (231 kg)  03/04/18 (!) 475 lb (215.5 kg)      Health Maintenance Due  Topic Date Due  . Hepatitis C Screening  03-05-1956  . TETANUS/TDAP  10/16/1976    There are no preventive care reminders to display for this patient.  Lab Results  Component Value Date   TSH 2.420 06/23/2019   Lab Results  Component Value Date   WBC 7.1 06/19/2019   HGB 13.2 06/19/2019   HCT 44.4 06/19/2019   MCV 92.7 06/19/2019   PLT 199 06/19/2019   Lab Results  Component Value Date   NA 139 06/19/2019   K 3.6 06/19/2019  CO2 36 (H) 06/19/2019   GLUCOSE 102 (H) 06/19/2019   BUN 8 06/19/2019   CREATININE 0.76 06/19/2019   BILITOT 1.1 06/11/2019   ALKPHOS 50 06/11/2019   AST 22 06/11/2019   ALT 18 06/11/2019   PROT 6.2 (L) 06/11/2019   ALBUMIN 2.3 (L) 06/11/2019   CALCIUM 8.8 (L) 06/19/2019   ANIONGAP 9 06/19/2019   Lab Results  Component Value Date   CHOL 161 06/23/2019   Lab Results  Component Value Date   HDL 39 (L) 06/23/2019   Lab Results  Component Value Date   LDLCALC 110 (H) 06/23/2019   Lab Results  Component Value Date   TRIG 58 06/23/2019   Lab Results  Component Value Date   CHOLHDL 4.1 06/23/2019   Lab Results  Component Value Date   HGBA1C 5.2 06/24/2019   HGBA1C 5.2 06/24/2019   HGBA1C 5.2 (A) 06/24/2019   HGBA1C 5.2 06/24/2019      Assessment & Plan:   1. Acute foot pain, left - DG Foot Complete Left; Future  2. Left foot pain - DG Foot Complete Left; Future  3. SOB (shortness of breath) Stable. No signs or symptoms of respiratory distress noted or reported.  - albuterol (VENTOLIN HFA) 108 (90 Base) MCG/ACT inhaler; Inhale 2 puffs into the lungs every 6 (six) hours as needed for wheezing or shortness of breath.  Dispense: 8 g; Refill: 12  4. Follow up He will follow up in 2 months.   Meds ordered this encounter  Medications  . albuterol (VENTOLIN HFA) 108 (90 Base) MCG/ACT inhaler    Sig: Inhale 2 puffs into the lungs every 6 (six) hours as needed for wheezing or shortness of breath.     Dispense:  8 g    Refill:  12    Orders Placed This Encounter  Procedures  . DG Foot Complete Left    Raliegh IpNatalie Zyairah Wacha,  MSN, FNP-BC [facility] [address]  [place], [place] [address] [phone] fax    Problem List Items Addressed This Visit      Other   SOB (shortness of breath)   Relevant Medications   albuterol (VENTOLIN HFA) 108 (90 Base) MCG/ACT inhaler    Other Visit Diagnoses    Acute foot pain, left    -  Primary   Relevant Orders   DG Foot Complete Left   Left foot pain       Relevant Orders   DG Foot Complete Left   Follow up          Meds ordered this encounter  Medications  . albuterol (VENTOLIN HFA) 108 (90 Base) MCG/ACT inhaler    Sig: Inhale 2 puffs into the lungs every 6 (six) hours as needed for wheezing or shortness of breath.    Dispense:  8 g    Refill:  12    Follow-up: No follow-ups on file.    Kallie LocksNatalie M Jet Traynham, FNP

## 2019-07-16 ENCOUNTER — Ambulatory Visit: Payer: 59 | Admitting: Dietician

## 2019-07-20 ENCOUNTER — Telehealth: Payer: Self-pay | Admitting: Family Medicine

## 2019-07-20 ENCOUNTER — Other Ambulatory Visit: Payer: Self-pay | Admitting: Family Medicine

## 2019-07-20 DIAGNOSIS — E559 Vitamin D deficiency, unspecified: Secondary | ICD-10-CM

## 2019-07-20 DIAGNOSIS — I10 Essential (primary) hypertension: Secondary | ICD-10-CM

## 2019-07-20 MED ORDER — LISINOPRIL 2.5 MG PO TABS: 2.5000 mg | ORAL_TABLET | Freq: Every day | ORAL | 3 refills | Status: DC

## 2019-07-20 MED ORDER — VITAMIN D (ERGOCALCIFEROL) 1.25 MG (50000 UNIT) PO CAPS: 50000.0000 [IU] | ORAL_CAPSULE | ORAL | 3 refills | Status: DC

## 2019-07-21 NOTE — Telephone Encounter (Signed)
done

## 2019-07-23 ENCOUNTER — Ambulatory Visit: Payer: 59 | Admitting: Family Medicine

## 2019-08-18 ENCOUNTER — Other Ambulatory Visit: Payer: Self-pay | Admitting: Family Medicine

## 2019-08-18 DIAGNOSIS — I10 Essential (primary) hypertension: Secondary | ICD-10-CM

## 2019-09-14 ENCOUNTER — Ambulatory Visit: Payer: 59 | Admitting: Family Medicine

## 2019-11-08 ENCOUNTER — Other Ambulatory Visit: Payer: Self-pay | Admitting: Family Medicine

## 2019-11-08 DIAGNOSIS — E559 Vitamin D deficiency, unspecified: Secondary | ICD-10-CM

## 2019-11-29 DIAGNOSIS — F419 Anxiety disorder, unspecified: Secondary | ICD-10-CM

## 2019-11-29 HISTORY — DX: Anxiety disorder, unspecified: F41.9

## 2019-12-09 ENCOUNTER — Telehealth: Payer: Self-pay

## 2019-12-09 NOTE — Telephone Encounter (Signed)
Jeffrey Skinner a Conservation officer, nature with [employer] called today, she has been trying to assist pt with care and follow ups. She asked me to give you her contact information and if any questions or concerns after pt's tele visit on 12/13/2019 if you could reach out to her. Her phone number is [phone] ext [phone].

## 2019-12-10 ENCOUNTER — Telehealth: Payer: Self-pay | Admitting: Family Medicine

## 2019-12-10 NOTE — Telephone Encounter (Signed)
Pt called and reminded of appointment 

## 2019-12-13 ENCOUNTER — Other Ambulatory Visit: Payer: Self-pay

## 2019-12-13 ENCOUNTER — Ambulatory Visit (INDEPENDENT_AMBULATORY_CARE_PROVIDER_SITE_OTHER): Payer: 59 | Admitting: Family Medicine

## 2019-12-13 ENCOUNTER — Encounter: Payer: Self-pay | Admitting: Family Medicine

## 2019-12-13 DIAGNOSIS — R0602 Shortness of breath: Secondary | ICD-10-CM | POA: Diagnosis not present

## 2019-12-13 DIAGNOSIS — M79672 Pain in left foot: Secondary | ICD-10-CM

## 2019-12-13 DIAGNOSIS — F419 Anxiety disorder, unspecified: Secondary | ICD-10-CM | POA: Diagnosis not present

## 2019-12-13 DIAGNOSIS — I10 Essential (primary) hypertension: Secondary | ICD-10-CM | POA: Diagnosis not present

## 2019-12-13 DIAGNOSIS — G4700 Insomnia, unspecified: Secondary | ICD-10-CM

## 2019-12-13 DIAGNOSIS — Z09 Encounter for follow-up examination after completed treatment for conditions other than malignant neoplasm: Secondary | ICD-10-CM

## 2019-12-13 DIAGNOSIS — Z6841 Body Mass Index (BMI) 40.0 and over, adult: Secondary | ICD-10-CM

## 2019-12-13 DIAGNOSIS — G4730 Sleep apnea, unspecified: Secondary | ICD-10-CM

## 2019-12-13 DIAGNOSIS — E66813 Obesity, class 3: Secondary | ICD-10-CM

## 2019-12-15 ENCOUNTER — Telehealth: Payer: Self-pay | Admitting: Family Medicine

## 2019-12-15 NOTE — Progress Notes (Signed)
Virtual Visit via Telephone Note  I connected with Jeffrey Skinner on 12/16/19 at  3:00 PM EDT by telephone and verified that I am speaking with the correct person using two identifiers.   I discussed the limitations, risks, security and privacy concerns of performing an evaluation and management service by telephone and the availability of in person appointments. I also discussed with the patient that there may be a patient responsible charge related to this service. The patient expressed understanding and agreed to proceed.   History of Present Illness:  Past Surgical History:  Procedure Laterality Date  . APPENDECTOMY    . CHOLECYSTECTOMY    . HERNIA REPAIR    . VASECTOMY  1998    Social History   Tobacco Use  . Smoking status: Never Smoker  . Smokeless tobacco: Never Used  Substance Use Topics  . Alcohol use: Yes    Alcohol/week: 1.0 standard drinks    Types: 1 Shots of liquor per week    Comment: - once a month  . Drug use: No    No Known Allergies   Family History  Problem Relation Age of Onset  . Rheum arthritis Mother   . Arthritis Sister   . Cancer - Lung Brother   . Heart disease Brother     Past Medical History:  Diagnosis Date  . Acute diastolic CHF (congestive heart failure) (HCC) 06/14/2019  . Arthritis   . Depression   . Dyspnea   . Hypertension   . Morbid obesity with BMI of 60.0-69.9, adult (HCC)   . Sleep apnea   . Vitamin D deficiency 06/2019    Current Status: Since his last office visit, he continues to have left foot pain and generalized over body. He continues to struggle with his weight. He states that has discontinues Home Health services, and is unsure as to why. He has insomnia, and believes that it is r/t not understanding how to use his CPAP machine. He continues to receive calls from them for assistance if needed. His anxiety is increased r/t his health conditions. He denies suicidal ideations, homicidal ideations, or auditory  hallucinations. He denies fevers, chills, fatigue, recent infections, weight loss, and night sweats. He has not had any headaches, visual changes, dizziness, and falls. No chest pain, heart palpitations, cough and shortness of breath reported. No reports of GI problems such as nausea, vomiting, diarrhea, and constipation. He has no reports of blood in stools, dysuria and hematuria.  Observations/Objective:  Telephone Virtual Visit   Assessment and Plan:  1. Anxiety Increased r/t his healthcare status. Referred for Televisit.  - Ambulatory referral to Psychiatry  2. Hypertension, unspecified type He will continue to take medications as prescribed, to decrease high sodium intake, excessive alcohol intake, increase potassium intake, smoking cessation, and increase physical activity of at least 30 minutes of cardio activity daily. He will continue to follow Heart Healthy or DASH diet.  3. Left foot pain  4. SOB (shortness of breath) Stable. No signs or symptoms of respiratory distress noted or reported.   5. Sleep apnea, unspecified type He will contact Home Health team for assistance with CPAP machine. I have reached out to case manager to inform.   6. Insomnia, unspecified type  7. Class 3 severe obesity due to excess calories with serious comorbidity and body mass index (BMI) of 60.0 to 69.9 in adult [facility]) There is no height or weight on file to calculate BMI. Goal BMI  is <30. Encouraged efforts to  reduce weight include engaging in physical activity as tolerated with goal of 150 minutes per week. Improve dietary choices and eat a meal regimen consistent with a Mediterranean or DASH diet. Reduce simple carbohydrates. Do not skip meals and eat healthy snacks throughout the day to avoid over-eating at dinner. Set a goal weight loss that is achievable for you.   Follow Up Instructions: Patient is Homebound. TeleVisit please.    No orders of the defined types were placed in this  encounter.   Orders Placed This Encounter  Procedures  . Ambulatory referral to Psychiatry     Referral Orders     Ambulatory referral to Psychiatry   Kathe Becton,  MSN, FNP-BC [place] [address] [place], [place] [address] [phone] fax  I discussed the assessment and treatment plan with the patient. The patient was provided an opportunity to ask questions and all were answered. The patient agreed with the plan and demonstrated an understanding of the instructions.   The patient was advised to call back or seek an in-person evaluation if the symptoms worsen or if the condition fails to improve as anticipated.  I provided 20 minutes of non-face-to-face time during this encounter.   Azzie Glatter, FNP

## 2019-12-16 ENCOUNTER — Encounter: Payer: Self-pay | Admitting: Family Medicine

## 2019-12-16 DIAGNOSIS — F419 Anxiety disorder, unspecified: Secondary | ICD-10-CM | POA: Insufficient documentation

## 2019-12-16 DIAGNOSIS — Z6841 Body Mass Index (BMI) 40.0 and over, adult: Secondary | ICD-10-CM | POA: Insufficient documentation

## 2019-12-16 DIAGNOSIS — E66813 Obesity, class 3: Secondary | ICD-10-CM | POA: Insufficient documentation

## 2019-12-16 DIAGNOSIS — G4700 Insomnia, unspecified: Secondary | ICD-10-CM | POA: Insufficient documentation

## 2019-12-20 NOTE — Telephone Encounter (Signed)
Sent to NP 

## 2020-01-31 ENCOUNTER — Telehealth: Payer: Self-pay | Admitting: Family Medicine

## 2020-01-31 NOTE — Telephone Encounter (Signed)
Sharyn Creamer called on pt's behalf.Her number is [phone] [phone]. She wanted to follow up on psychiatry referral and she wanted a call back regarding pt's health. In addition, she wanted to thank you for reaching out previously.

## 2020-02-02 ENCOUNTER — Encounter: Payer: Self-pay | Admitting: Neurology

## 2020-02-02 ENCOUNTER — Other Ambulatory Visit: Payer: Self-pay | Admitting: Family Medicine

## 2020-02-02 DIAGNOSIS — F419 Anxiety disorder, unspecified: Secondary | ICD-10-CM

## 2020-02-02 DIAGNOSIS — F32A Depression, unspecified: Secondary | ICD-10-CM

## 2020-02-02 DIAGNOSIS — F329 Major depressive disorder, single episode, unspecified: Secondary | ICD-10-CM

## 2020-02-03 ENCOUNTER — Ambulatory Visit (INDEPENDENT_AMBULATORY_CARE_PROVIDER_SITE_OTHER): Payer: 59 | Admitting: Neurology

## 2020-02-03 ENCOUNTER — Encounter: Payer: Self-pay | Admitting: Neurology

## 2020-02-03 ENCOUNTER — Other Ambulatory Visit: Payer: Self-pay

## 2020-02-03 VITALS — BP 135/71 | HR 92 | Temp 96.6°F | Ht 76.0 in | Wt >= 6400 oz

## 2020-02-03 DIAGNOSIS — J9602 Acute respiratory failure with hypercapnia: Secondary | ICD-10-CM

## 2020-02-03 DIAGNOSIS — Z6841 Body Mass Index (BMI) 40.0 and over, adult: Secondary | ICD-10-CM

## 2020-02-03 DIAGNOSIS — I5031 Acute diastolic (congestive) heart failure: Secondary | ICD-10-CM

## 2020-02-03 DIAGNOSIS — R0601 Orthopnea: Secondary | ICD-10-CM

## 2020-02-03 DIAGNOSIS — J9601 Acute respiratory failure with hypoxia: Secondary | ICD-10-CM

## 2020-02-03 DIAGNOSIS — J9611 Chronic respiratory failure with hypoxia: Secondary | ICD-10-CM | POA: Diagnosis not present

## 2020-02-03 NOTE — Progress Notes (Signed)
SLEEP MEDICINE CLINIC    Provider:  Melvyn Novas, MD  Primary Care Physician:  Kallie Locks, FNP [address] [place] [address]     [facility] though outside provider in 30 days.        Chief Complaint according to patient   Patient presents with:    . New Patient (Initial Visit)     Patient last hospitalized at [facility]  in Sept 2020 for 2 weeks for acute respiratory failure.  He was already on CPAP/ BiPAP for 2.5 years , following a sleep study at [facility]- were d/c to [facility] .  He used PAP  until his recent start on oxygen from the hospital..      HISTORY OF PRESENT ILLNESS:  Jeffrey Skinner is a 64 year- old  African- American male patient seen here upon referral by FNP Bradly Chris on 02/03/2020  for a sleep disorder evaluation.   Chief concern according to patient :  My sleep is terrible on oxygen canula and I am not using PAP now.   It seems that Jeffrey Skinner wasn't instructed at all about continued use of PAP therapy and how to plug the oxygen into the PAP device . Patient last hospitalized at [facility]  in Sept 2020 for 2 weeks for acute respiratory failure.  He was already on CPAP/ BiPAP for 2.5 years , following a sleep study at [facility]- were d/c to [facility] .  He used PAP until his recent start on oxygen from the hospital in September 2020.he is now excessively daytime sleepy.     I have the pleasure of seeing Jeffrey Skinner today, a right-handed [place] American male with a known sleep disorder.  He  has a past medical history of Acute diastolic CHF (congestive heart failure) (HCC) (06/14/2019), Anxiety (12/16/2019), Anxiety (11/2019), Arthritis, Depression, Dyspnea, Hypertension, Insomnia (11/2029), Morbid obesity with BMI of 60.0-69.9, adult [facility]), Sleep apnea, and Vitamin D deficiency (06/2019)..   Jeffrey Skinner.  Was ordered to have  a sleep study through the [place] order [facility]- his sleep study was scheduled for March 2021.  On April 13 the study was counseled.  He is now referred to [place] out of proximity and easier reach for him I could not find the results of his previous sleep study.  We will order preferably a split-night polysomnography we know that the patient will still have apnea we just do not know to what extent I would very much prefer to do a split-night polysomnography have him sleep if possible for 2 hours and if he cannot initiate sleep out of compassion start him on CPAP or BiPAP immediately.  Oxygen will continue which means he is using 2 L per nasal cannula right now this will be bled into the machine.  We are not having to confirm his need for oxygen we are having to confirm his need for BiPAP or CPAP.      Family medical /sleep history: No other family member on CPAP with OSA.   Social history:  Patient is semi-working / semi-retired Investment banker, operational and lives in a household with 2 persons, spouse and son . The patient currently part time . Pets are non present. Tobacco use: none .  ETOH use : seldomly   Caffeine intake in form of Coffee(  3 / week ) Soda( 3/ week) Tea ( /)  no energy drinks. Regular exercise - none- walks with a walker  Hobbies :cooking - he has lost sensation in both hands - and is not save to cut.       Sleep habits are as follows: The patient's dinner time is between 7-8 PM. The patient goes to bed at 7-8  PM but watches TV in bed- drift off- sleeps poorly - and continues to sleep for 1 hour, wakes for SOB, bathroom breaks- 2-3 times nocturia -  The preferred sleep position is reclined - orthopnoea.Dreams are reportedly  frequent/vivid.  Variable - but 7-8 AM is the usual rise time.  The patient wakes up spontaneously.  He reports not feeling refreshed or restored in AM, with symptoms such as dry mouth,, morning headaches and EDS< residual fatigue.  Naps are taken frequently, dozing off , not scheduled to nap-   lasting from 45-3 hours  and are more/ less refreshing than nocturnal sleep.    Review of Systems: Out of a complete 14 system review, the patient complains of only the following symptoms, and all other reviewed systems are negative.:  Fatigue, sleepiness , snoring apnea  fragmented sleep, Insomnia ! SOB, heart palpitations,    How likely are you to doze in the following situations: 0 = not likely, 1 = slight chance, 2 = moderate chance, 3 = high chance   Sitting and Reading? Watching Television? Sitting inactive in a public place (theater or meeting)? As a passenger in a car for an hour without a break? Lying down in the afternoon when circumstances permit? Sitting and talking to someone? Sitting quietly after lunch without alcohol? In a car, while stopped for a few minutes in traffic?   Total = 18/ 24 points   FSS endorsed at 57/ 63 points.   Social History   Socioeconomic History  . Marital status: Married    Spouse name: Lorriane Shire  . Number of children: 2  . Years of education: Not on file  . Highest education level: Not on file  Occupational History  . Not on file  Tobacco Use  . Smoking status: Never Smoker  . Smokeless tobacco: Never Used  Substance and Sexual Activity  . Alcohol use: Yes    Alcohol/week: 1.0 standard drinks    Types: 1 Shots of liquor per week    Comment: - once a month  . Drug use: No  . Sexual activity: Not Currently  Other Topics Concern  . Not on file  Social History Narrative  . Not on file   Social Determinants of Health   Financial Resource Strain: Low Risk   . Difficulty of Paying Living Expenses: Not hard at all  Food Insecurity: No Food Insecurity  . Worried About Charity fundraiser in the Last Year: Never true  . Ran Out of Food in the Last Year: Never true  Transportation Needs: No Transportation Needs  . Lack of Transportation (Medical): No  . Lack of Transportation (Non-Medical): No  Physical Activity: Inactive  . Days of  Exercise per Week: 0 days  . Minutes of Exercise per Session: 0 min  Stress: Stress Concern Present  . Feeling of Stress : Rather much  Social Connections: Somewhat Isolated  . Frequency of Communication with Friends and Family: More than three times a week  . Frequency of Social Gatherings with Friends and Family: Three times a week  . Attends Religious Services: Never  . Active Member of Clubs or Organizations: No  . Attends Club  or Organization Meetings: Never  . Marital Status: Married    Family History  Problem Relation Age of Onset  . Rheum arthritis Mother   . Arthritis Sister   . Cancer - Lung Brother   . Heart disease Brother     Past Medical History:  Diagnosis Date  . Acute diastolic CHF (congestive heart failure) (HCC) 06/14/2019  . Anxiety 12/16/2019  . Anxiety 11/2019  . Arthritis   . Depression   . Dyspnea   . Hypertension   . Insomnia 11/2029  . Morbid obesity with BMI of 60.0-69.9, adult (HCC)   . Sleep apnea   . Vitamin D deficiency 06/2019    Past Surgical History:  Procedure Laterality Date  . APPENDECTOMY    . CHOLECYSTECTOMY    . HERNIA REPAIR    . VASECTOMY  1998     Current Outpatient Medications on File Prior to Visit  Medication Sig Dispense Refill  . Acetaminophen (TYLENOL 8 HOUR PO) Take by mouth.    Marland Kitchen amLODipine (NORVASC) 5 MG tablet Take 5 mg by mouth daily.  0  . furosemide (LASIX) 80 MG tablet Take 80 mg by mouth daily.  0  . ipratropium-albuterol (DUONEB) 0.5-2.5 (3) MG/3ML SOLN Take 3 mLs by nebulization every 6 (six) hours as needed. 360 mL 1  . lisinopril (ZESTRIL) 2.5 MG tablet TAKE 1 TABLET BY MOUTH EVERY DAY 30 tablet 3  . Multiple Vitamins-Minerals (MULTIVITAMIN ADULTS PO) Take 1 tablet by mouth daily.    Marland Kitchen Respiratory Therapy Supplies (NEBULIZER) DEVI by Does not apply route.    . Vitamin D, Ergocalciferol, (DRISDOL) 1.25 MG (50000 UNIT) CAPS capsule TAKE 1 CAPSULE (50,000 UNITS TOTAL) BY MOUTH EVERY 7 (SEVEN) DAYS. 5 capsule  3   No current facility-administered medications on file prior to visit.    Allergies  Allergen Reactions  . Dog Epithelium     Physical exam:  Today's Vitals   02/03/20 1028  BP: 135/71  Pulse: 92  Temp: (!) 96.6 F (35.9 C)  Weight: (!) 500 lb (226.8 kg)  Height: 6\' 4"  (1.93 m)   Body mass index is 60.86 kg/m.   Wt Readings from Last 3 Encounters:  02/03/20 (!) 500 lb (226.8 kg)  06/23/19 (!) 510 lb (231.3 kg)  06/19/19 (!) 509 lb 3.2 oz (231 kg)     Ht Readings from Last 3 Encounters:  02/03/20 6\' 4"  (1.93 m)  07/13/19 6\' 4"  (1.93 m)  06/23/19 6\' 4"  (1.93 m)      General: The patient is awake, alert and appears in discomfort- has a difficult time to sit.   Head: Normocephalic, atraumatic. Neck is supple. Mallampati 3 plus - not visible ,  Facial hair.  neck circumference:21 inches . Nasal airflow  patent.  Retrognathia is not  seen.   Cardiovascular:  Regular rate and cardiac rhythm by pulse,  without distended neck veins. Respiratory: Lungs are clear to auscultation.  Skin:  With evidence of ankle edema, dry skin rash.  Trunk: massive obesity   Neurologic exam : The patient is awake and alert, oriented to place and time.   Memory subjective described as intact.  Attention span & concentration ability appears normal.  Speech is fluent,  without  dysarthria, dysphonia or aphasia.  Mood and affect are appropriate.   Cranial nerves: no loss of smell or taste reported - not yet vaccinated.  Pupils are equal and briskly reactive to light. Funduscopic exam deferred.  Extraocular movements in vertical and horizontal  planes were intact and without nystagmus. No Diplopia. Visual fields by finger perimetry are intact. Hearing was intact to soft voice and finger rubbing.    Facial sensation intact to fine touch.  Facial motor strength is symmetric and tongue and uvula move midline.  Neck ROM : rotation, tilt and flexion extension were normal for age and shoulder  shrug was symmetrical.  Motor exam:  Symmetric bulk, tone and ROM.   Normal tone without cog wheeling, symmetric grip strength . Sensory:  Fine touch, pinprick and vibration were tested  and  normal.  Proprioception tested in the upper extremities was normal. Coordination: Rapid alternating movements in the fingers/hands were of normal speed.  The Finger-to-nose maneuver was intact without evidence of ataxia, dysmetria or tremor. Gait and station: Patient could rise with assistence from a seated position, walked with a walker - very slowly, very laboured. Needs  assistive device.  Toe and heel walk were deferred. Deep tendon reflexes: in the  upper and lower extremities are absent      After spending a total time of 46  minutes face to face and additional time for physical and neurologic examination, review of laboratory studies,  personal review of imaging studies, reports and results of other testing and review of referral information / records as far as provided in visit, I have established the following assessments:  1)  Orthopnea, hypoxemia, on oxygen.  2) obesity hypoventilation  3)  BiPAP dependent OSA   My Plan is to proceed with:  . [place] was ordered to have  a sleep study through the [place] order [facility]- his sleep study was scheduled for March 2021.  On April 13 the study was counseled.  He is now referred to [place] out of proximity and easier reach for him I could not find the results of his previous sleep study.  We will order preferably a split-night polysomnography we know that the patient will still have apnea we just do not know to what extent I would very much prefer to do a split-night polysomnography have him sleep if possible for 2 hours and if he cannot initiate sleep out of compassion start him on CPAP or BiPAP immediately.  Oxygen will continue which means he is using 2 L per nasal cannula right now this will be bled into the machine.  We  are not having to confirm his need for oxygen we are having to confirm his need for BiPAP or CPAP.  I would like to thank Kallie Locks, FNP and Kallie Locks, Fnp [address] [place],  [place] [address] for allowing me to meet with and to take care of this pleasant patient.   II plan to follow up either personally or through our NP within 2-3 month.   CC: [facility]  He is using n PAP right now !   Electronically signed by: Melvyn Novas, MD 02/03/2020 10:55 AM  [facility] Board certified by The [employer] of Sleep Medicine and Diplomate of the [employer] of Sleep Medicine. Board certified In Neurology through the ABPN, Fellow of the [employer] of Neurology. Medical Director of [facility].

## 2020-02-03 NOTE — Patient Instructions (Signed)
Chronic Respiratory Failure  Respiratory failure is a condition in which the lungs do not work well and the breathing (respiratory) system fails. When respiratory failure occurs, it becomes difficult for the lungs to get enough oxygen or to eliminate carbon dioxide or to do both duties. If the lungs do not work properly, the heart, brain, and other body systems do not get enough oxygen. Respiratory failure is life-threatening if it is not treated. Respiratory failure can be acute or chronic. Acute respiratory failure is sudden and severe and requires emergency medical treatment. Chronic respiratory failure happens over time, usually due to a medical condition that gets worse. What are the causes? This condition may be caused by any problem that affects the heart or lungs. Causes include:  Chronic bronchitis and emphysema (COPD).  Pulmonary fibrosis.  Water in the lungs due to heart failure, lung injury, or infection (pulmonary edema).  Asthma.  Nerve or muscle diseases that make chest movements difficult, such as Lou Gehrig disease or Guillain-Barre syndrome.  A collapsed lung (pneumothorax).  Pulmonary hypertension.  Chronic sleep apnea.  Pneumonia.  Obesity.  A blood clot in a lung (pulmonary embolism).  Trauma to the chest that makes breathing difficult. What increases the risk? You are more likely to develop this condition if:  You are a smoker, or have a history of smoking.  You have a weak immune system.  You have a family history of breathing problems or lung disease.  You have a long term lung disease such as COPD. What are the signs or symptoms? Symptoms of this condition include:  Shortness of breath with or without activity.  Difficulty breathing.  Wheezing.  A fast or irregular heartbeat (arrhythmia).  Chest pain or tightness.  A bluish color to the fingernail or toenail beds (cyanosis).  Confusion.  Drowsiness.  Extreme fatigue, especially with  minimal activity. How is this diagnosed? This condition may be diagnosed based on:  Your medical history.  A physical exam.  Other tests, such as: ? A chest X-ray. ? A CT scan of your lungs. ? Blood tests, such as an arterial blood gas test. This test is done to check if you have enough oxygen in your blood. ? An electrocardiogram. This test records the electrical activity of your heart. ? An echocardiogram. This test uses sound waves to produce an image of your heart.  A check of your blood pressure, heart rate, breathing rate, and blood oxygen level. How is this treated? Treatment for this condition depends on the cause. Treatment can include the following:  Getting oxygen through a nasal cannula. This is a tube that goes in your nose.  Getting oxygen through a face mask.  Receiving noninvasive positive pressure ventilation. This is a method of breathing support in which a machine blows air into your lungs through a mask. The machine allows you to breathe on your own. It helps the body take in oxygen and eliminate carbon dioxide.  Using a ventilator. This is a breathing machine that delivers oxygen to the lungs through a breathing tube that is put into the trachea. This machine is used when you can no longer breathe well enough on your own.  Medicines to help with breathing, such as: ? Medicines that open up and relax air passages, such as bronchodilators. These may be given through a device that turns liquid medicines into a mist you can breathe in (nebulizer). These medicines help with breathing. ? Diuretics. These medicines get rid of extra fluid out   of your lungs, which can help you breathe better. ? Steroid medicines. These decrease inflammation in the lungs. ? Antibiotic medicines. These may be given to treat a bacterial infection, such as pneumonia.  Pulmonary rehabilitation. This is an exercise program that strengthens the muscles in your chest and helps you learn breathing  techniques in order to manage your condition. Follow these instructions at home: Medicines  Take over-the-counter and prescription medicines only as told by your health care provider.  If you were prescribed an antibiotic medicine, take it as told by your health care provider. Do not stop taking the antibiotic even if you start to feel better. General instructions  Use oxygen therapy and pulmonary rehabilitation if directed to by your health care provider. If you require home oxygen therapy, ask your health care provider whether you should purchase a pulse oximeter to measure your oxygen level at home.  Work with your health care provider to create a plan to help you deal with your condition. Follow this plan.  Do not use any products that contain nicotine or tobacco, such as cigarettes and e-cigarettes. If you need help quitting, ask your health care provider.  Avoid exposure to irritants that make your breathing problems worse. These include smoke, chemicals, and fumes.  Stay active, but balance activity with periods of rest. Exercise and physical activity will help you maintain your ability to do things you want to do.  Stay up to date on all vaccines, especially yearly influenza and pneumonia vaccines.  Avoid people who are sick as well as crowded places during the flu season.  Keep all follow-up visits as told by your health care provider. This is important. Contact a health care provider if:  Your shortness of breath gets worse and you cannot do the things you used to do.  You have increased mucus (sputum), wheezing, coughing, or loss of energy.  You are on oxygen therapy and you are starting to need more.  You need to use your medicines more often.  You have a fever. Get help right away if:  Your shortness of breath becomes worse.  You are unable to say more than a few words without having to catch your breath.  You develop chest pain or  tightness. Summary  Respiratory failure is a condition in which the lungs do not work well and the breathing system fails.  This condition can be very serious and is often life-threatening.  This condition is diagnosed with tests and can be treated with medicines or oxygen.  Contact a health care provider if your shortness of breath gets worse or if you need to use your oxygen or medicines more often than before. This information is not intended to replace advice given to you by your health care provider. Make sure you discuss any questions you have with your health care provider. Document Revised: 08/29/2017 Document Reviewed: 09/27/2016 Elsevier Patient Education  2020 Else[employer] Inc.  

## 2020-02-05 ENCOUNTER — Other Ambulatory Visit: Payer: Self-pay | Admitting: Family Medicine

## 2020-02-05 DIAGNOSIS — I10 Essential (primary) hypertension: Secondary | ICD-10-CM

## 2020-02-22 ENCOUNTER — Telehealth: Payer: Self-pay

## 2020-02-22 NOTE — Telephone Encounter (Signed)
Reached out to patient again to schedule sleep study. Pt is not wanting to get a Covid test done prior to sleep study. Pt will call back at a later date to schedule. He states he wants to get vaccinated first. Gave patient my direct number to call me back when ready to schedule.

## 2020-03-15 ENCOUNTER — Ambulatory Visit: Payer: 59 | Admitting: Family Medicine

## 2020-12-29 ENCOUNTER — Emergency Department (HOSPITAL_COMMUNITY): Payer: 59

## 2020-12-29 ENCOUNTER — Encounter (HOSPITAL_COMMUNITY): Payer: Self-pay

## 2020-12-29 ENCOUNTER — Other Ambulatory Visit: Payer: Self-pay

## 2020-12-29 ENCOUNTER — Telehealth: Payer: 59 | Admitting: Nurse Practitioner

## 2020-12-29 ENCOUNTER — Telehealth: Payer: Self-pay

## 2020-12-29 ENCOUNTER — Inpatient Hospital Stay (HOSPITAL_COMMUNITY)
Admission: EM | Admit: 2020-12-29 | Discharge: 2021-01-02 | DRG: 291 | Disposition: A | Discharge status: Home Health | Payer: 59 | Attending: Internal Medicine | Admitting: Internal Medicine

## 2020-12-29 DIAGNOSIS — E872 Acidosis, unspecified: Secondary | ICD-10-CM

## 2020-12-29 DIAGNOSIS — Z79899 Other long term (current) drug therapy: Secondary | ICD-10-CM

## 2020-12-29 DIAGNOSIS — Z91048 Other nonmedicinal substance allergy status: Secondary | ICD-10-CM

## 2020-12-29 DIAGNOSIS — R0789 Other chest pain: Secondary | ICD-10-CM

## 2020-12-29 DIAGNOSIS — E559 Vitamin D deficiency, unspecified: Secondary | ICD-10-CM | POA: Diagnosis present

## 2020-12-29 DIAGNOSIS — I10 Essential (primary) hypertension: Secondary | ICD-10-CM | POA: Diagnosis not present

## 2020-12-29 DIAGNOSIS — Z8249 Family history of ischemic heart disease and other diseases of the circulatory system: Secondary | ICD-10-CM | POA: Diagnosis not present

## 2020-12-29 DIAGNOSIS — I5030 Unspecified diastolic (congestive) heart failure: Secondary | ICD-10-CM

## 2020-12-29 DIAGNOSIS — J9622 Acute and chronic respiratory failure with hypercapnia: Secondary | ICD-10-CM | POA: Diagnosis present

## 2020-12-29 DIAGNOSIS — I5031 Acute diastolic (congestive) heart failure: Secondary | ICD-10-CM | POA: Diagnosis not present

## 2020-12-29 DIAGNOSIS — J438 Other emphysema: Secondary | ICD-10-CM

## 2020-12-29 DIAGNOSIS — E662 Morbid (severe) obesity with alveolar hypoventilation: Secondary | ICD-10-CM | POA: Diagnosis present

## 2020-12-29 DIAGNOSIS — J410 Simple chronic bronchitis: Secondary | ICD-10-CM | POA: Diagnosis not present

## 2020-12-29 DIAGNOSIS — Z6841 Body Mass Index (BMI) 40.0 and over, adult: Secondary | ICD-10-CM

## 2020-12-29 DIAGNOSIS — J449 Chronic obstructive pulmonary disease, unspecified: Secondary | ICD-10-CM | POA: Diagnosis not present

## 2020-12-29 DIAGNOSIS — J9621 Acute and chronic respiratory failure with hypoxia: Secondary | ICD-10-CM | POA: Diagnosis present

## 2020-12-29 DIAGNOSIS — Z20822 Contact with and (suspected) exposure to covid-19: Secondary | ICD-10-CM | POA: Diagnosis present

## 2020-12-29 DIAGNOSIS — J441 Chronic obstructive pulmonary disease with (acute) exacerbation: Secondary | ICD-10-CM | POA: Diagnosis present

## 2020-12-29 DIAGNOSIS — E8809 Other disorders of plasma-protein metabolism, not elsewhere classified: Secondary | ICD-10-CM | POA: Diagnosis present

## 2020-12-29 DIAGNOSIS — I5033 Acute on chronic diastolic (congestive) heart failure: Secondary | ICD-10-CM | POA: Diagnosis present

## 2020-12-29 DIAGNOSIS — R609 Edema, unspecified: Secondary | ICD-10-CM | POA: Diagnosis not present

## 2020-12-29 DIAGNOSIS — N200 Calculus of kidney: Secondary | ICD-10-CM | POA: Diagnosis present

## 2020-12-29 DIAGNOSIS — I110 Hypertensive heart disease with heart failure: Secondary | ICD-10-CM | POA: Diagnosis present

## 2020-12-29 DIAGNOSIS — R109 Unspecified abdominal pain: Secondary | ICD-10-CM

## 2020-12-29 DIAGNOSIS — Z9119 Patient's noncompliance with other medical treatment and regimen: Secondary | ICD-10-CM | POA: Diagnosis not present

## 2020-12-29 DIAGNOSIS — Z9852 Vasectomy status: Secondary | ICD-10-CM

## 2020-12-29 DIAGNOSIS — M7989 Other specified soft tissue disorders: Secondary | ICD-10-CM

## 2020-12-29 DIAGNOSIS — G4733 Obstructive sleep apnea (adult) (pediatric): Secondary | ICD-10-CM | POA: Diagnosis not present

## 2020-12-29 DIAGNOSIS — R6510 Systemic inflammatory response syndrome (SIRS) of non-infectious origin without acute organ dysfunction: Secondary | ICD-10-CM | POA: Diagnosis present

## 2020-12-29 DIAGNOSIS — R0602 Shortness of breath: Secondary | ICD-10-CM | POA: Diagnosis present

## 2020-12-29 DIAGNOSIS — R1010 Upper abdominal pain, unspecified: Secondary | ICD-10-CM | POA: Diagnosis not present

## 2020-12-29 LAB — HEPATIC FUNCTION PANEL
ALT: 26 U/L (ref 0–44)
AST: 30 U/L (ref 15–41)
Albumin: 3.4 g/dL — ABNORMAL LOW (ref 3.5–5.0)
Alkaline Phosphatase: 78 U/L (ref 38–126)
Bilirubin, Direct: 0.2 mg/dL (ref 0.0–0.2)
Indirect Bilirubin: 0.8 mg/dL (ref 0.3–0.9)
Total Bilirubin: 1 mg/dL (ref 0.3–1.2)
Total Protein: 8.6 g/dL — ABNORMAL HIGH (ref 6.5–8.1)

## 2020-12-29 LAB — BASIC METABOLIC PANEL
Anion gap: 10 (ref 5–15)
BUN: <5 mg/dL — ABNORMAL LOW (ref 8–23)
CO2: 34 mmol/L — ABNORMAL HIGH (ref 22–32)
Calcium: 8.4 mg/dL — ABNORMAL LOW (ref 8.9–10.3)
Chloride: 91 mmol/L — ABNORMAL LOW (ref 98–111)
Creatinine, Ser: 0.94 mg/dL (ref 0.61–1.24)
GFR, Estimated: >60 mL/min (ref >60)
Glucose, Bld: 112 mg/dL — ABNORMAL HIGH (ref 70–99)
Potassium: 3.7 mmol/L (ref 3.5–5.1)
Sodium: 135 mmol/L (ref 135–145)

## 2020-12-29 LAB — CBC
HCT: 48.9 % (ref 39.0–52.0)
Hemoglobin: 14.6 g/dL (ref 13.0–17.0)
MCH: 29 pg (ref 26.0–34.0)
MCHC: 29.9 g/dL — ABNORMAL LOW (ref 30.0–36.0)
MCV: 97 fL (ref 80.0–100.0)
Platelets: 182 10*3/uL (ref 150–400)
RBC: 5.04 MIL/uL (ref 4.22–5.81)
RDW: 14 % (ref 11.5–15.5)
WBC: 6.2 10*3/uL (ref 4.0–10.5)
nRBC: 0 % (ref 0.0–0.2)

## 2020-12-29 LAB — TROPONIN I (HIGH SENSITIVITY): Troponin I (High Sensitivity): 5 ng/L (ref <18)

## 2020-12-29 LAB — RESP PANEL BY RT-PCR (FLU A&B, COVID) ARPGX2
Influenza A by PCR: NEGATIVE
Influenza B by PCR: NEGATIVE
SARS Coronavirus 2 by RT PCR: NEGATIVE

## 2020-12-29 LAB — BRAIN NATRIURETIC PEPTIDE: B Natriuretic Peptide: 11.2 pg/mL (ref 0.0–100.0)

## 2020-12-29 LAB — LACTIC ACID, PLASMA: Lactic Acid, Venous: 2.5 mmol/L (ref 0.5–1.9)

## 2020-12-29 MED ORDER — IOHEXOL 350 MG/ML SOLN
100.0000 mL | Freq: Once | INTRAVENOUS | Status: AC | PRN
  Administered 2020-12-29: 100 mL via INTRAVENOUS

## 2020-12-29 MED ORDER — SODIUM CHLORIDE 0.9 % IV BOLUS
2000.0000 mL | Freq: Once | INTRAVENOUS | Status: AC
  Administered 2020-12-29: 2000 mL via INTRAVENOUS

## 2020-12-29 MED ORDER — SODIUM CHLORIDE 0.9 % IV BOLUS: 1000.0000 mL | Freq: Once | INTRAVENOUS | Status: DC

## 2020-12-29 MED ORDER — ACETAMINOPHEN 325 MG PO TABS
650.0000 mg | ORAL_TABLET | Freq: Once | ORAL | Status: AC
  Administered 2020-12-29: 650 mg via ORAL
  Filled 2020-12-29: qty 2

## 2020-12-29 MED ORDER — VANCOMYCIN HCL 2000 MG/400ML IV SOLN
2000.0000 mg | Freq: Two times a day (BID) | INTRAVENOUS | Status: DC
  Administered 2020-12-30 – 2020-12-31 (×4): 2000 mg via INTRAVENOUS
  Filled 2020-12-29 (×4): qty 400

## 2020-12-29 MED ORDER — ENOXAPARIN SODIUM 120 MG/0.8ML ~~LOC~~ SOLN
110.0000 mg | Freq: Every day | SUBCUTANEOUS | Status: DC
  Administered 2020-12-30 – 2021-01-02 (×4): 110 mg via SUBCUTANEOUS
  Filled 2020-12-29 (×4): qty 0.74

## 2020-12-29 MED ORDER — VANCOMYCIN HCL 10 G IV SOLR
2500.0000 mg | Freq: Once | INTRAVENOUS | Status: AC
  Administered 2020-12-29: 2500 mg via INTRAVENOUS
  Filled 2020-12-29: qty 2500

## 2020-12-29 MED ORDER — PIPERACILLIN-TAZOBACTAM 3.375 G IVPB
3.3750 g | Freq: Three times a day (TID) | INTRAVENOUS | Status: AC
  Administered 2020-12-29 – 2021-01-01 (×9): 3.375 g via INTRAVENOUS
  Filled 2020-12-29 (×11): qty 50

## 2020-12-29 MED ORDER — SODIUM CHLORIDE 0.9 % IV SOLN: Freq: Once | INTRAVENOUS | Status: AC

## 2020-12-29 NOTE — ED Notes (Signed)
Pt fell asleep and O2 sat decreased into the 40's. Pt placed on non rebreather till O2 sat increased. RT notified that pt need C- Pap due to his obstructive sleep apnea. Pt is suppose to use C-pap at home.

## 2020-12-29 NOTE — ED Notes (Signed)
Pt's O2 sat decreased into 50's on C- pap. RT therapist came to bedside and adjusted pt setting.

## 2020-12-29 NOTE — Progress Notes (Signed)
Pt continued to desat on CPAP  Machine even after settings changed to 18/8 w 10l bleed in. Pt placed on V60 Bipap 22/10 60% BUR 12. AT this time  Pt resing comfortably and SPO2 95%

## 2020-12-29 NOTE — ED Notes (Signed)
RT at bedside at this time.

## 2020-12-29 NOTE — ED Notes (Signed)
Jeffrey Muse, MD a secure chat notifying of pt's O2 sat dropping into the 50's while on C-pap.

## 2020-12-29 NOTE — H&P (Signed)
History and Physical  Jeffrey Skinner SUP:[id number] DOB: 07/26/1956 DOA: 12/29/2020  Referring physician: Wyvonnia Dusky, MD PCP: Azzie Glatter, FNP  Patient coming from: Home  Chief Complaint: Short breath, chest pain  HPI: Jeffrey Skinner is a 65 y.o. male with medical history significant for COPD on supplemental oxygen of 3 LPM at baseline, hypertension, HFpEF (LVEF 60 to 65% on 06/10/2019), OSA who presents to the emergency department due to shortness of breath and chest pain.  Patient endorsed baseline shortness of breath that has been ongoing for several months.  He complained of new onset of low bilateral chest pain and upper abdominal chest pain which started yesterday in the evening and continued till this morning, pain was persistent, it was rated as 8/10 on pain scale and was associated with worsening shortness of breath.  Patient endorsed chronic leg swelling without any new leg swelling.  He states that he has been compliant with his medication.  He denies nausea, vomiting, headache, diarrhea.  Patient states that he already had his Covid vaccines.  ED Course:  In the emergency department, he was febrile with a temperature of 101F, he was tachycardic and tachypneic.  Work-up in the ED showed normal CBC and BMP.  BNP 11.2, troponin x1 was negative, lactic acid was 2.5.  SARS coronavirus 2 was negative. CT angiography of chest ruled out pulmonary embolus CT abdomen and pelvis showed nonobstructive bilateral nephrolithiasis and bilateral fat-containing inguinal hernias with no findings suggestive of ischemia associated bowel obstruction. Patient was treated with IV vancomycin and Zosyn.  IV hydration was provided.  Hospitalist was asked to admit patient for further evaluation and management.   Review of Systems: Constitutional: Negative for chills and fever.  HENT: Negative for ear pain and sore throat.   Eyes: Negative for pain and visual disturbance.  Respiratory: Positive for  shortness of breath.  Negative for cough and chest tightness Cardiovascular: Positive for chest pain and negative for palpitations.  Gastrointestinal: Positive for abdominal pain and nausea.  Negative for vomiting.  Endocrine: Negative for polyphagia and polyuria.  Genitourinary: Negative for decreased urine volume, dysuria, enuresis Musculoskeletal: Negative for arthralgias and back pain.  Skin: Negative for color change and rash.  Allergic/Immunologic: Negative for immunocompromised state.  Neurological: Negative for tremors, syncope, speech difficulty, weakness, light-headedness and headaches.  Hematological: Does not bruise/bleed easily.  All other systems reviewed and are negative  Past Medical History:  Diagnosis Date  . Acute diastolic CHF (congestive heart failure) ([place]) 06/14/2019  . Anxiety 12/16/2019  . Anxiety 11/2019  . Arthritis   . COPD (chronic obstructive pulmonary disease) ([place]) 12/30/2020  . Depression   . Dyspnea   . Hypertension   . Insomnia 11/2029  . Morbid obesity with BMI of 60.0-69.9, adult ([place])   . Sleep apnea   . Vitamin D deficiency 06/2019   Past Surgical History:  Procedure Laterality Date  . APPENDECTOMY    . CHOLECYSTECTOMY    . HERNIA REPAIR    . VASECTOMY  1998    Social History:  reports that he has never smoked. He has never used smokeless tobacco. He reports current alcohol use of about 1.0 standard drink of alcohol per week. He reports that he does not use drugs.   Allergies  Allergen Reactions  . Dog Epithelium     Family History  Problem Relation Age of Onset  . Rheum arthritis Mother   . Arthritis Sister   . Cancer - Lung Brother   .  Heart disease Brother      Prior to Admission medications   Medication Sig Start Date End Date Taking? Authorizing Provider  albuterol (VENTOLIN HFA) 108 (90 Base) MCG/ACT inhaler Inhale 2 puffs into the lungs every 6 (six) hours as needed for wheezing or shortness of breath.   Yes [provider]  amLODipine (NORVASC) 5 MG tablet Take 5 mg by mouth daily. 12/14/17  Yes [provider]  furosemide (LASIX) 80 MG tablet Take 80 mg by mouth daily. 12/14/17  Yes [provider]  ipratropium-albuterol (DUONEB) 0.5-2.5 (3) MG/3ML SOLN Take 3 mLs by nebulization every 6 (six) hours as needed. Patient taking differently: Take 3 mLs by nebulization every 6 (six) hours as needed (shortness of breath). 06/24/19  Yes Azzie Glatter, FNP  lisinopril (ZESTRIL) 2.5 MG tablet TAKE 1 TABLET BY MOUTH EVERY DAY 02/07/20  Yes Azzie Glatter, FNP  Multiple Vitamins-Minerals (MULTIVITAMIN ADULTS PO) Take 1 tablet by mouth daily.   Yes [provider]  naproxen (NAPROSYN) 500 MG tablet Take 500 mg by mouth in the morning and at bedtime. Pain & inflammation 02/16/20  Yes [provider]  Respiratory Therapy Supplies (NEBULIZER) DEVI by Does not apply route.    [provider]  Vitamin D, Ergocalciferol, (DRISDOL) 1.25 MG (50000 UNIT) CAPS capsule TAKE 1 CAPSULE (50,000 UNITS TOTAL) BY MOUTH EVERY 7 (SEVEN) DAYS. Patient not taking: No sig reported 11/08/19   Azzie Glatter, FNP    Physical Exam: BP 104/62 (BP Location: Left Wrist)   Pulse 80   Temp 99.3 F (37.4 C) (Oral)   Resp 20   Ht '6\' 4"'  (1.93 m)   Wt (!) 226.8 kg   SpO2 98%   BMI 60.86 kg/m   . General: 65 y.o. year-old male obese, but in no acute distress.  Alert and oriented x3. Marland Kitchen HEENT: NCAT, EOMI . Neck: Supple, trachea medial . Cardiovascular: Regular rate and rhythm with no rubs or gallops.  No thyromegaly or JVD noted.  2/4 pulses in all 4 extremities. Marland Kitchen Respiratory: Clear to auscultation with no wheezes or rales. Good inspiratory effort. . Abdomen: Soft, obese, nontender, normal bowel sounds x4 quadrants. . Muskuloskeletal: RLE swelling  > LLE.  No cyanosis or clubbing noted bilaterally . Neuro: CN II-XII intact, strength 5/5 x 4, sensation, reflexes intact . Skin: No  ulcerative lesions noted or rashes . Psychiatry: Judgement and insight appear normal. Mood is appropriate for condition and setting          Labs on Admission:  Basic Metabolic Panel: Recent Labs  Lab 12/29/20 1304  NA 135  K 3.7  CL 91*  CO2 34*  GLUCOSE 112*  BUN <5*  CREATININE 0.94  CALCIUM 8.4*   Liver Function Tests: Recent Labs  Lab 12/29/20 1304  AST 30  ALT 26  ALKPHOS 78  BILITOT 1.0  PROT 8.6*  ALBUMIN 3.4*   No results for input(s): LIPASE, AMYLASE in the last 168 hours. No results for input(s): AMMONIA in the last 168 hours. CBC: Recent Labs  Lab 12/29/20 1304  WBC 6.2  HGB 14.6  HCT 48.9  MCV 97.0  PLT 182   Cardiac Enzymes: No results for input(s): CKTOTAL, CKMB, CKMBINDEX, TROPONINI in the last 168 hours.  BNP (last 3 results) Recent Labs    12/29/20 1308  BNP 11.2    ProBNP (last 3 results) No results for input(s): PROBNP in the last 8760 hours.  CBG: No results for input(s):  GLUCAP in the last 168 hours.  Radiological Exams on Admission: CT Angio Chest PE W and/or Wo Contrast  Result Date: 12/29/2020 CLINICAL DATA:  Suspected high probability pulmonary embolus. Fever of unknown origin. Diverticulitis suspected. Cough, shortness of breath, diffuse epigastric pain, nausea, constipation. EXAM: CT ANGIOGRAPHY CHEST CT ABDOMEN AND PELVIS WITH CONTRAST TECHNIQUE: Multidetector CT imaging of the chest was performed using the standard protocol during bolus administration of intravenous contrast. Multiplanar CT image reconstructions and MIPs were obtained to evaluate the vascular anatomy. Multidetector CT imaging of the abdomen and pelvis was performed using the standard protocol during bolus administration of intravenous contrast. CONTRAST:  131m OMNIPAQUE IOHEXOL 350 MG/ML SOLN COMPARISON:  CT angio chest 03/05/2018 FINDINGS: CTA CHEST FINDINGS Cardiovascular: Fair opacification of the pulmonary arteries to the proximal segmental level. No  evidence of pulmonary embolism. Normal heart size. No pericardial effusion. Mediastinum/Nodes: Redemonstration of calcified right inter lobar nodes that are not large enlarged in size. No enlarged mediastinal, hilar, or axillary lymph nodes. Thyroid gland, trachea, and esophagus demonstrate no significant findings. Lungs/Pleura: Expiratory phase of respiration. Low lung volumes. Linear atelectasis. No focal consolidation. Calcified nodules at the right base. No pulmonary mass. Musculoskeletal: No chest wall abnormality. No suspicious lytic or blastic osseous lesions. No acute displaced fracture. Review of the MIP images confirms the above findings. CT ABDOMEN and PELVIS FINDINGS Hepatobiliary: No focal liver abnormality. No gallstones, gallbladder wall thickening, or pericholecystic fluid. No biliary dilatation. Pancreas: No adrenal nodule bilaterally. Bilateral kidneys enhance symmetrically. No hydronephrosis. No hydroureter. The urinary bladder is unremarkable. Spleen: Normal in size without focal abnormality. Adrenals/Urinary Tract: No adrenal nodule bilaterally. Bilateral kidneys enhance symmetrically. Bilateral punctate nephrolithiasis. Up to 3 mm calcified stone on the left. No hydronephrosis. 1.5 cm fluid density exophytic right kidney lesion that likely represents a simple renal cyst. Otherwise no contour-deforming renal mass. No ureterolithiasis or hydroureter. The urinary bladder is unremarkable. On delayed imaging, there is no urothelial wall thickening and there are no filling defects in the opacified portions of the bilateral collecting systems or ureters. Stomach/Bowel: Stomach is within normal limits. No evidence of bowel wall thickening or dilatation. The appendix not definitely identified. Vascular/Lymphatic: No significant vascular findings are present. No enlarged abdominal or pelvic lymph nodes. Reproductive: Prostate is unremarkable. Other: No intraperitoneal free fluid. No intraperitoneal free  gas. No organized fluid collection. Musculoskeletal: Bilateral fat containing small inguinal hernias. No suspicious lytic or blastic osseous lesions. No acute displaced fracture. Multilevel degenerative changes of the spine. Review of the MIP images confirms the above findings. IMPRESSION: 1. No central pulmonary embolus. Markedly limited evaluation distally due to timing of contrast. 2. No acute intrathoracic abnormality in a patient with low lung volumes. 3. Nonobstructive bilateral nephrolithiasis. 4. Bilateral fat containing inguinal hernias with no findings suggest ischemia or associated bowel obstruction. 5. Right lung sequelae of prior granulomatous disease. Electronically Signed   By: MIven FinnM.D.   On: 12/29/2020 18:35   CT ABDOMEN PELVIS W CONTRAST  Result Date: 12/29/2020 CLINICAL DATA:  Suspected high probability pulmonary embolus. Fever of unknown origin. Diverticulitis suspected. Cough, shortness of breath, diffuse epigastric pain, nausea, constipation. EXAM: CT ANGIOGRAPHY CHEST CT ABDOMEN AND PELVIS WITH CONTRAST TECHNIQUE: Multidetector CT imaging of the chest was performed using the standard protocol during bolus administration of intravenous contrast. Multiplanar CT image reconstructions and MIPs were obtained to evaluate the vascular anatomy. Multidetector CT imaging of the abdomen and pelvis was performed using the standard protocol during bolus administration of  intravenous contrast. CONTRAST:  143m OMNIPAQUE IOHEXOL 350 MG/ML SOLN COMPARISON:  CT angio chest 03/05/2018 FINDINGS: CTA CHEST FINDINGS Cardiovascular: Fair opacification of the pulmonary arteries to the proximal segmental level. No evidence of pulmonary embolism. Normal heart size. No pericardial effusion. Mediastinum/Nodes: Redemonstration of calcified right inter lobar nodes that are not large enlarged in size. No enlarged mediastinal, hilar, or axillary lymph nodes. Thyroid gland, trachea, and esophagus demonstrate no  significant findings. Lungs/Pleura: Expiratory phase of respiration. Low lung volumes. Linear atelectasis. No focal consolidation. Calcified nodules at the right base. No pulmonary mass. Musculoskeletal: No chest wall abnormality. No suspicious lytic or blastic osseous lesions. No acute displaced fracture. Review of the MIP images confirms the above findings. CT ABDOMEN and PELVIS FINDINGS Hepatobiliary: No focal liver abnormality. No gallstones, gallbladder wall thickening, or pericholecystic fluid. No biliary dilatation. Pancreas: No adrenal nodule bilaterally. Bilateral kidneys enhance symmetrically. No hydronephrosis. No hydroureter. The urinary bladder is unremarkable. Spleen: Normal in size without focal abnormality. Adrenals/Urinary Tract: No adrenal nodule bilaterally. Bilateral kidneys enhance symmetrically. Bilateral punctate nephrolithiasis. Up to 3 mm calcified stone on the left. No hydronephrosis. 1.5 cm fluid density exophytic right kidney lesion that likely represents a simple renal cyst. Otherwise no contour-deforming renal mass. No ureterolithiasis or hydroureter. The urinary bladder is unremarkable. On delayed imaging, there is no urothelial wall thickening and there are no filling defects in the opacified portions of the bilateral collecting systems or ureters. Stomach/Bowel: Stomach is within normal limits. No evidence of bowel wall thickening or dilatation. The appendix not definitely identified. Vascular/Lymphatic: No significant vascular findings are present. No enlarged abdominal or pelvic lymph nodes. Reproductive: Prostate is unremarkable. Other: No intraperitoneal free fluid. No intraperitoneal free gas. No organized fluid collection. Musculoskeletal: Bilateral fat containing small inguinal hernias. No suspicious lytic or blastic osseous lesions. No acute displaced fracture. Multilevel degenerative changes of the spine. Review of the MIP images confirms the above findings. IMPRESSION: 1.  No central pulmonary embolus. Markedly limited evaluation distally due to timing of contrast. 2. No acute intrathoracic abnormality in a patient with low lung volumes. 3. Nonobstructive bilateral nephrolithiasis. 4. Bilateral fat containing inguinal hernias with no findings suggest ischemia or associated bowel obstruction. 5. Right lung sequelae of prior granulomatous disease. Electronically Signed   By: MIven FinnM.D.   On: 12/29/2020 18:35   DG Chest Port 1 View  Result Date: 12/29/2020 CLINICAL DATA:  Shortness of breath, fever. EXAM: PORTABLE CHEST 1 VIEW COMPARISON:  June 11, 2019. FINDINGS: Stable cardiomegaly. No pneumothorax is noted. Mild central pulmonary vascular congestion is noted with probable mild bilateral pulmonary edema. No significant pleural effusion is noted. Bony thorax is unremarkable. IMPRESSION: Stable cardiomegaly with central pulmonary vascular congestion. Mild bilateral pulmonary edema is noted. Electronically Signed   By: JMarijo ConceptionM.D.   On: 12/29/2020 13:41    EKG: I independently viewed the EKG done and my findings are as followed: Sinus tachycardia at a rate of 110 bpm  Assessment/Plan Present on Admission: . SIRS (systemic inflammatory response syndrome) (HCC)  Principal Problem:   SIRS (systemic inflammatory response syndrome) (HCC) Active Problems:   Acute on chronic respiratory failure with hypoxia (HCC)   Abdominal pain   Atypical chest pain   COPD (chronic obstructive pulmonary disease) (HCC)   Obstructive sleep apnea   Essential hypertension   (HFpEF) heart failure with preserved ejection fraction (HCC)   Acute on chronic respiratory failure with hypoxia possibly secondary to SIRS in the setting of abdominal  pain and atypical chest pain Patient was febrile, tachycardic and tachypneic (met SIRS criteria).  No known source of infection at this time. Lactic acid was 2.5. Troponin x1 was negative.  Bilateral chest pain has since  improved EKG showed sinus tachycardia at rate of 110 bpm IV hydration was provided, patient was started on IV Vanco and Zosyn.  We shall continue with same at this time with plans to de-escalate/discontinue based on procalcitonin and blood culture. Continue Tylenol 650 mg every 6 hours as needed for fever  Lactic acidosis Lactic acid 2.5; this is possibly due to above continue to trend lactic acid  COPD (not in acute exacerbation)  Obstructive sleep apnea Continue BiPAP  History of HFpEF Stable; LVEF 60 to 65% on echocardiogram done on  06/10/2019 BNP 11.2 (though unreliable in obese patients)  Essential hypertension BP meds will be held at this time due to soft BP  Hypoalbuminemia Albumin 3.4, protein supplement will be provided  Morbid obesity (BMI 60.86) Patient counseled on diet and lifestyle modification Patient will need to follow-up with PCP for weight loss program  RLE swelling > LLE Lower extremity Doppler ultrasound will be done to rule out DVT   DVT prophylaxis: Lovenox  Code Status: Full code  Family Communication: None at bedside  Disposition Plan:  Patient is from:                        home Anticipated DC to:                   SNF or family members home Anticipated DC date:               2-3 days Anticipated DC barriers:           Patient requires inpatient management due to SIRS with suspicion for sepsis currently on IV antibiotics  Consults called: None  Admission status: Inpatient    Bernadette Hoit MD [facility]  12/30/2020, 12:21 AM

## 2020-12-29 NOTE — H&P (Incomplete)
History and Physical  [name]:[id number] DOB: Sep 15, 1956 DOA: 12/29/2020  Referring physician: ***  PCP: Kallie Locks, FNP  Outpatient Specialists: *** Patient coming from: *** & is able to ambulate ***  Chief Complaint: ***   HPI: Jeffrey Skinner is a 65 y.o. male with medical history significant for *** (For level 3, the HPI must include 4+ descriptors: Location, Quality, Severity, Duration, Timing, Context, modifying factors, associated signs/symptoms and/or status of 3+ chronic problems.)   ED Course: ***  Review of Systems: Review of systems as noted in the HPI. All other systems reviewed and are negative.   Past Medical History:  Diagnosis Date  . Acute diastolic CHF (congestive heart failure) (HCC) 06/14/2019  . Anxiety 12/16/2019  . Anxiety 11/2019  . Arthritis   . Depression   . Dyspnea   . Hypertension   . Insomnia 11/2029  . Morbid obesity with BMI of 60.0-69.9, adult (HCC)   . Sleep apnea   . Vitamin D deficiency 06/2019   Past Surgical History:  Procedure Laterality Date  . APPENDECTOMY    . CHOLECYSTECTOMY    . HERNIA REPAIR    . VASECTOMY  1998    Social History:  reports that he has never smoked. He has never used smokeless tobacco. He reports current alcohol use of about 1.0 standard drink of alcohol per week. He reports that he does not use drugs.   Allergies  Allergen Reactions  . Dog Epithelium     Family History  Problem Relation Age of Onset  . Rheum arthritis Mother   . Arthritis Sister   . Cancer - Lung Brother   . Heart disease Brother     ***  Prior to Admission medications   Medication Sig Start Date End Date Taking? Authorizing Provider  albuterol (VENTOLIN HFA) 108 (90 Base) MCG/ACT inhaler Inhale 2 puffs into the lungs every 6 (six) hours as needed for wheezing or shortness of breath.   Yes [provider]  amLODipine (NORVASC) 5 MG tablet Take 5 mg by mouth daily. 12/14/17  Yes [provider]   furosemide (LASIX) 80 MG tablet Take 80 mg by mouth daily. 12/14/17  Yes [provider]  ipratropium-albuterol (DUONEB) 0.5-2.5 (3) MG/3ML SOLN Take 3 mLs by nebulization every 6 (six) hours as needed. Patient taking differently: Take 3 mLs by nebulization every 6 (six) hours as needed (shortness of breath). 06/24/19  Yes Kallie Locks, FNP  lisinopril (ZESTRIL) 2.5 MG tablet TAKE 1 TABLET BY MOUTH EVERY DAY 02/07/20  Yes Kallie Locks, FNP  Multiple Vitamins-Minerals (MULTIVITAMIN ADULTS PO) Take 1 tablet by mouth daily.   Yes [provider]  naproxen (NAPROSYN) 500 MG tablet Take 500 mg by mouth in the morning and at bedtime. Pain & inflammation 02/16/20  Yes [provider]  Respiratory Therapy Supplies (NEBULIZER) DEVI by Does not apply route.    [provider]  Vitamin D, Ergocalciferol, (DRISDOL) 1.25 MG (50000 UNIT) CAPS capsule TAKE 1 CAPSULE (50,000 UNITS TOTAL) BY MOUTH EVERY 7 (SEVEN) DAYS. Patient not taking: No sig reported 11/08/19   Kallie Locks, FNP    Physical Exam: BP 111/66   Pulse 87   Temp 98.3 F (36.8 C) (Oral)   Resp 17   Ht 6\' 4"  (1.93 m)   Wt (!) 226.8 kg   SpO2 96%   BMI 60.86 kg/m   . General: 65 y.o. year-old male well developed well nourished in no acute distress.  Alert and oriented x3. . Cardiovascular: Regular rate and rhythm with no rubs or gallops.  No thyromegaly or JVD noted.  No lower extremity edema. 2/4 pulses in all 4 extremities. Marland Kitchen. Respiratory: Clear to auscultation with no wheezes or rales. Good inspiratory effort. . Abdomen: Soft nontender nondistended with normal bowel sounds x4 quadrants. . Muskuloskeletal: No cyanosis, clubbing or edema noted bilaterally . Neuro: CN II-XII intact, strength, sensation, reflexes . Skin: No ulcerative lesions noted or rashes . Psychiatry: Judgement and insight appear normal. Mood is appropriate for condition and setting          Labs on Admission:  Basic  Metabolic Panel: Recent Labs  Lab 12/29/20 1304  NA 135  K 3.7  CL 91*  CO2 34*  GLUCOSE 112*  BUN <5*  CREATININE 0.94  CALCIUM 8.4*   Liver Function Tests: Recent Labs  Lab 12/29/20 1304  AST 30  ALT 26  ALKPHOS 78  BILITOT 1.0  PROT 8.6*  ALBUMIN 3.4*   No results for input(s): LIPASE, AMYLASE in the last 168 hours. No results for input(s): AMMONIA in the last 168 hours. CBC: Recent Labs  Lab 12/29/20 1304  WBC 6.2  HGB 14.6  HCT 48.9  MCV 97.0  PLT 182   Cardiac Enzymes: No results for input(s): CKTOTAL, CKMB, CKMBINDEX, TROPONINI in the last 168 hours.  BNP (last 3 results) Recent Labs    12/29/20 1308  BNP 11.2    ProBNP (last 3 results) No results for input(s): PROBNP in the last 8760 hours.  CBG: No results for input(s): GLUCAP in the last 168 hours.  Radiological Exams on Admission: CT Angio Chest PE W and/or Wo Contrast  Result Date: 12/29/2020 CLINICAL DATA:  Suspected high probability pulmonary embolus. Fever of unknown origin. Diverticulitis suspected. Cough, shortness of breath, diffuse epigastric pain, nausea, constipation. EXAM: CT ANGIOGRAPHY CHEST CT ABDOMEN AND PELVIS WITH CONTRAST TECHNIQUE: Multidetector CT imaging of the chest was performed using the standard protocol during bolus administration of intravenous contrast. Multiplanar CT image reconstructions and MIPs were obtained to evaluate the vascular anatomy. Multidetector CT imaging of the abdomen and pelvis was performed using the standard protocol during bolus administration of intravenous contrast. CONTRAST:  100mL OMNIPAQUE IOHEXOL 350 MG/ML SOLN COMPARISON:  CT angio chest 03/05/2018 FINDINGS: CTA CHEST FINDINGS Cardiovascular: Fair opacification of the pulmonary arteries to the proximal segmental level. No evidence of pulmonary embolism. Normal heart size. No pericardial effusion. Mediastinum/Nodes: Redemonstration of calcified right inter lobar nodes that are not large enlarged in  size. No enlarged mediastinal, hilar, or axillary lymph nodes. Thyroid gland, trachea, and esophagus demonstrate no significant findings. Lungs/Pleura: Expiratory phase of respiration. Low lung volumes. Linear atelectasis. No focal consolidation. Calcified nodules at the right base. No pulmonary mass. Musculoskeletal: No chest wall abnormality. No suspicious lytic or blastic osseous lesions. No acute displaced fracture. Review of the MIP images confirms the above findings. CT ABDOMEN and PELVIS FINDINGS Hepatobiliary: No focal liver abnormality. No gallstones, gallbladder wall thickening, or pericholecystic fluid. No biliary dilatation. Pancreas: No adrenal nodule bilaterally. Bilateral kidneys enhance symmetrically. No hydronephrosis. No hydroureter. The urinary bladder is unremarkable. Spleen: Normal in size without focal abnormality. Adrenals/Urinary Tract: No adrenal nodule bilaterally. Bilateral kidneys enhance symmetrically. Bilateral punctate nephrolithiasis. Up to 3 mm calcified stone on the left. No hydronephrosis. 1.5 cm fluid density exophytic right kidney lesion that likely represents a simple renal cyst. Otherwise no contour-deforming renal mass. No ureterolithiasis or hydroureter. The urinary bladder is unremarkable. On  delayed imaging, there is no urothelial wall thickening and there are no filling defects in the opacified portions of the bilateral collecting systems or ureters. Stomach/Bowel: Stomach is within normal limits. No evidence of bowel wall thickening or dilatation. The appendix not definitely identified. Vascular/Lymphatic: No significant vascular findings are present. No enlarged abdominal or pelvic lymph nodes. Reproductive: Prostate is unremarkable. Other: No intraperitoneal free fluid. No intraperitoneal free gas. No organized fluid collection. Musculoskeletal: Bilateral fat containing small inguinal hernias. No suspicious lytic or blastic osseous lesions. No acute displaced fracture.  Multilevel degenerative changes of the spine. Review of the MIP images confirms the above findings. IMPRESSION: 1. No central pulmonary embolus. Markedly limited evaluation distally due to timing of contrast. 2. No acute intrathoracic abnormality in a patient with low lung volumes. 3. Nonobstructive bilateral nephrolithiasis. 4. Bilateral fat containing inguinal hernias with no findings suggest ischemia or associated bowel obstruction. 5. Right lung sequelae of prior granulomatous disease. Electronically Signed   By: Tish Frederickson M.D.   On: 12/29/2020 18:35   CT ABDOMEN PELVIS W CONTRAST  Result Date: 12/29/2020 CLINICAL DATA:  Suspected high probability pulmonary embolus. Fever of unknown origin. Diverticulitis suspected. Cough, shortness of breath, diffuse epigastric pain, nausea, constipation. EXAM: CT ANGIOGRAPHY CHEST CT ABDOMEN AND PELVIS WITH CONTRAST TECHNIQUE: Multidetector CT imaging of the chest was performed using the standard protocol during bolus administration of intravenous contrast. Multiplanar CT image reconstructions and MIPs were obtained to evaluate the vascular anatomy. Multidetector CT imaging of the abdomen and pelvis was performed using the standard protocol during bolus administration of intravenous contrast. CONTRAST:  OMNIPAQUE IOHEXOL 350 MG/ML SOLN COMPARISON:  CT angio chest 03/05/2018 FINDINGS: CTA CHEST FINDINGS Cardiovascular: Fair opacification of the pulmonary arteries to the proximal segmental level. No evidence of pulmonary embolism. Normal heart size. No pericardial effusion. Mediastinum/Nodes: Redemonstration of calcified right inter lobar nodes that are not large enlarged in size. No enlarged mediastinal, hilar, or axillary lymph nodes. Thyroid gland, trachea, and esophagus demonstrate no significant findings. Lungs/Pleura: Expiratory phase of respiration. Low lung volumes. Linear atelectasis. No focal consolidation. Calcified nodules at the right base. No  pulmonary mass. Musculoskeletal: No chest wall abnormality. No suspicious lytic or blastic osseous lesions. No acute displaced fracture. Review of the MIP images confirms the above findings. CT ABDOMEN and PELVIS FINDINGS Hepatobiliary: No focal liver abnormality. No gallstones, gallbladder wall thickening, or pericholecystic fluid. No biliary dilatation. Pancreas: No adrenal nodule bilaterally. Bilateral kidneys enhance symmetrically. No hydronephrosis. No hydroureter. The urinary bladder is unremarkable. Spleen: Normal in size without focal abnormality. Adrenals/Urinary Tract: No adrenal nodule bilaterally. Bilateral kidneys enhance symmetrically. Bilateral punctate nephrolithiasis. Up to 3 mm calcified stone on the left. No hydronephrosis. 1.5 cm fluid density exophytic right kidney lesion that likely represents a simple renal cyst. Otherwise no contour-deforming renal mass. No ureterolithiasis or hydroureter. The urinary bladder is unremarkable. On delayed imaging, there is no urothelial wall thickening and there are no filling defects in the opacified portions of the bilateral collecting systems or ureters. Stomach/Bowel: Stomach is within normal limits. No evidence of bowel wall thickening or dilatation. The appendix not definitely identified. Vascular/Lymphatic: No significant vascular findings are present. No enlarged abdominal or pelvic lymph nodes. Reproductive: Prostate is unremarkable. Other: No intraperitoneal free fluid. No intraperitoneal free gas. No organized fluid collection. Musculoskeletal: Bilateral fat containing small inguinal hernias. No suspicious lytic or blastic osseous lesions. No acute displaced fracture. Multilevel degenerative changes of the spine. Review of the MIP images confirms the  above findings. IMPRESSION: 1. No central pulmonary embolus. Markedly limited evaluation distally due to timing of contrast. 2. No acute intrathoracic abnormality in a patient with low lung volumes. 3.  Nonobstructive bilateral nephrolithiasis. 4. Bilateral fat containing inguinal hernias with no findings suggest ischemia or associated bowel obstruction. 5. Right lung sequelae of prior granulomatous disease. Electronically Signed   By: Tish Frederickson M.D.   On: 12/29/2020 18:35   DG Chest Port 1 View  Result Date: 12/29/2020 CLINICAL DATA:  Shortness of breath, fever. EXAM: PORTABLE CHEST 1 VIEW COMPARISON:  June 11, 2019. FINDINGS: Stable cardiomegaly. No pneumothorax is noted. Mild central pulmonary vascular congestion is noted with probable mild bilateral pulmonary edema. No significant pleural effusion is noted. Bony thorax is unremarkable. IMPRESSION: Stable cardiomegaly with central pulmonary vascular congestion. Mild bilateral pulmonary edema is noted. Electronically Signed   By: Lupita Raider M.D.   On: 12/29/2020 13:41    EKG: I independently viewed the EKG done and my findings are as followed: ***   Assessment/Plan Present on Admission: . SIRS (systemic inflammatory response syndrome) (HCC)  Active Problems:   SIRS (systemic inflammatory response syndrome) (HCC)   DVT prophylaxis: ***   Code Status: ***   Family Communication: ***   Disposition Plan: ***   Consults called: ***   Admission status: ***     Frankey Shown MD [facility] Pager [phone]  If 7PM-7AM, please contact night-coverage [email or web address] Password [facility]  12/29/2020, 7:51 PM

## 2020-12-29 NOTE — ED Notes (Signed)
Dinner tray ordered.

## 2020-12-29 NOTE — ED Provider Notes (Signed)
MSE was initiated and I personally evaluated the patient and placed orders (if any) at  1:09 PM on December 29, 2020.    Patient reports that he is having chest pain since this morning, he feels like he can't breathe and someone is kneeling on his chest.  He states he has been taking all his medications as directed and not missed any doses.  Decreased air movement.  Here he is febrile, with increased work of breathing, tachycardia, hypoxia.    [id number] charge informed that patient needs a room, that he is febrile, weak, with chest pain and increased work of breathing.         Cristina Gong, PA-C 12/29/20 1311    Blane Ohara, MD 12/30/20 1004

## 2020-12-29 NOTE — ED Triage Notes (Signed)
Pt states he feels "terrible, like someone kneed me in my chest. Can't catch my breath". PT helped out of his car and into wheelchair. Labored breathing/weakness. Alert and Oriented.

## 2020-12-29 NOTE — ED Provider Notes (Signed)
Care was taken over from Dr. Renaye Rakers.  Patient was here for possible sepsis.  He has an elevated lactate and tachycardia with fever on arrival.  His WBC count is normal.  He had some associated chest pain and shortness of breath as well as some upper abdominal pain.  He had a CT scan of his chest abdomen pelvis.  There is no evidence of PE.  No evidence of intra-abdominal infection.  The source of infection has not been found.  He was previously given antibiotics presumptively.  Cultures have been done.  I spoke with Dr. Thomes Dinning who admit the patient for further treatment.   Rolan Bucco, MD 12/29/20 [phone]

## 2020-12-29 NOTE — Progress Notes (Signed)
Pharmacy Antibiotic Note  Jeffrey Skinner is a 65 y.o. male admitted on 12/29/2020 with sepsis after presenting with SOB and chest pain.  Pharmacy has been consulted for vancomycin and Zosyn dosing.  Plan: Vancomycin 2500 mg IV x1, then  2000 mg IV every 12 hours (Est. AUC 408)  Zosyn 3.375g IV q8h (4 hour infusion).  Height: 6\' 4"  (193 cm) Weight: (!) 226.8 kg (500 lb) IBW/kg (Calculated) : 86.8  Temp (24hrs), Avg:101 F (38.3 C), Min:101 F (38.3 C), Max:101 F (38.3 C)  Recent Labs  Lab 12/29/20 1304  WBC 6.2  CREATININE 0.94  LATICACIDVEN 2.5*    Estimated Creatinine Clearance: 162.5 mL/min (by C-G formula based on SCr of 0.94 mg/dL).    Allergies  Allergen Reactions  . Dog Epithelium     Antimicrobials this admission: Vanc 4/1 >>  Zosyn 4/1 >>   Microbiology results: 4/1 Bcx: pending   Thank you for allowing pharmacy to be a part of this patient's care.  6/1, PharmD PGY1 Acute Care Pharmacy Resident 12/29/2020 4:35 PM  Please check [email or web address] for unit-specific pharmacy phone numbers.

## 2020-12-29 NOTE — ED Notes (Signed)
Attempted to call report x 1  

## 2020-12-29 NOTE — ED Provider Notes (Signed)
[facility] EMERGENCY DEPARTMENT Provider Note   CSN: [id number] Arrival date & time: 12/29/20  1251     History Chief Complaint  Patient presents with  . Shortness of Breath  . Chest Pain    Jeffrey Skinner is a 65 y.o. male with a history of obesity, hypertension, diastolic heart failure, sleep apnea, presenting to the emergency department shortness of breath and chest pain.  He reports gradual onset of lower bilateral chest pain and upper abdominal chest pain yesterday evening to this morning.  Is been very persistent.  He feels he is short of breath due to this pain.  However he states "it is hard to say if really short of breath because I am away short of breath."  He wears 3 L of oxygen nasal cannula at baseline.  He denies any history of coronary disease or cardiac stents.  He denies any new leg swelling, states that he has his chronic level of edema in his legs.  He is compliant with his Lasix at home.  He reports he has had the COVID vaccines.  He has no sick contacts in the house.  He reports nausea but no vomiting.  He says has not had a bowel movement in the past 2 days, which is unusual as he normally has regular bowel movements.  His pain is very hard to describe.  It is focally located near the epigastrium but also radiates around the bilateral sides.  Nothing makes it better or worse.  He has never had this kind of pain before.  He says it feels like something heavy is sitting on it.  He has a history of a cholecystectomy.  HPI     Past Medical History:  Diagnosis Date  . Acute diastolic CHF (congestive heart failure) (HCC) 06/14/2019  . Anxiety 12/16/2019  . Anxiety 11/2019  . Arthritis   . Depression   . Dyspnea   . Hypertension   . Insomnia 11/2029  . Morbid obesity with BMI of 60.0-69.9, adult (HCC)   . Sleep apnea   . Vitamin D deficiency 06/2019    Patient Active Problem List   Diagnosis Date Noted  . Anxiety 12/16/2019  . Insomnia  12/16/2019  . Class 3 severe obesity due to excess calories with serious comorbidity and body mass index (BMI) of 60.0 to 69.9 in adult (HCC) 12/16/2019  . Acute foot pain, left 07/13/2019  . Acute diastolic CHF (congestive heart failure) (HCC) 06/14/2019  . Acute respiratory failure with hypoxia and hypercapnia (HCC) 06/09/2019  . Acute on chronic respiratory failure with hypoxia and hypercapnia (HCC) 06/09/2019  . Respiratory failure with hypoxia (HCC) 01/09/2017  . SOB (shortness of breath)   . Hypoxia     Past Surgical History:  Procedure Laterality Date  . APPENDECTOMY    . CHOLECYSTECTOMY    . HERNIA REPAIR    . VASECTOMY  1998       Family History  Problem Relation Age of Onset  . Rheum arthritis Mother   . Arthritis Sister   . Cancer - Lung Brother   . Heart disease Brother     Social History   Tobacco Use  . Smoking status: Never Smoker  . Smokeless tobacco: Never Used  Vaping Use  . Vaping Use: Never used  Substance Use Topics  . Alcohol use: Yes    Alcohol/week: 1.0 standard drink    Types: 1 Shots of liquor per week    Comment: - once a  month  . Drug use: No    Home Medications Prior to Admission medications   Medication Sig Start Date End Date Taking? Authorizing Provider  Acetaminophen (TYLENOL 8 HOUR PO) Take by mouth.    [provider]  amLODipine (NORVASC) 5 MG tablet Take 5 mg by mouth daily. 12/14/17   [provider]  furosemide (LASIX) 80 MG tablet Take 80 mg by mouth daily. 12/14/17   [provider]  ipratropium-albuterol (DUONEB) 0.5-2.5 (3) MG/3ML SOLN Take 3 mLs by nebulization every 6 (six) hours as needed. 06/24/19   Kallie Locks, FNP  lisinopril (ZESTRIL) 2.5 MG tablet TAKE 1 TABLET BY MOUTH EVERY DAY 02/07/20   Kallie Locks, FNP  Multiple Vitamins-Minerals (MULTIVITAMIN ADULTS PO) Take 1 tablet by mouth daily.    [provider]  Respiratory Therapy Supplies (NEBULIZER) DEVI by Does not apply  route.    [provider]  Vitamin D, Ergocalciferol, (DRISDOL) 1.25 MG (50000 UNIT) CAPS capsule TAKE 1 CAPSULE (50,000 UNITS TOTAL) BY MOUTH EVERY 7 (SEVEN) DAYS. 11/08/19   Kallie Locks, FNP    Allergies    Dog epithelium  Review of Systems   Review of Systems  Constitutional: Positive for fatigue. Negative for chills and fever.  HENT: Negative for ear pain and sore throat.   Eyes: Negative for pain and visual disturbance.  Respiratory: Positive for shortness of breath. Negative for cough.   Cardiovascular: Positive for chest pain. Negative for palpitations.  Gastrointestinal: Positive for abdominal pain, constipation and nausea. Negative for vomiting.  Genitourinary: Negative for dysuria and hematuria.  Musculoskeletal: Negative for arthralgias and back pain.  Skin: Negative for color change and rash.  Neurological: Negative for syncope and headaches.  All other systems reviewed and are negative.   Physical Exam Updated Vital Signs BP (!) 181/104 (BP Location: Right Arm)   Pulse (!) 112   Temp (!) 101 F (38.3 C)   Resp (!) 38   SpO2 95%   Physical Exam Constitutional:      General: He is not in acute distress.    Appearance: He is obese.  HENT:     Head: Normocephalic and atraumatic.  Eyes:     Conjunctiva/sclera: Conjunctivae normal.     Pupils: Pupils are equal, round, and reactive to light.  Cardiovascular:     Rate and Rhythm: Normal rate and regular rhythm.  Pulmonary:     Effort: Pulmonary effort is normal. No respiratory distress.     Comments: 3L [place], 94% Abdominal:     General: There is no distension.     Tenderness: There is no abdominal tenderness.     Comments: Obese abdomen, vague epigastric tenderness  Musculoskeletal:     Right lower leg: Edema present.     Left lower leg: Edema present.  Skin:    General: Skin is warm and dry.  Neurological:     General: No focal deficit present.     Mental Status: He is alert and oriented to  person, place, and time. Mental status is at baseline.  Psychiatric:        Mood and Affect: Mood normal.        Behavior: Behavior normal.     ED Results / Procedures / Treatments   Labs (all labs ordered are listed, but only abnormal results are displayed) Labs Reviewed  CULTURE, BLOOD (ROUTINE X 2)  CULTURE, BLOOD (ROUTINE X 2)  BASIC METABOLIC PANEL  CBC  LACTIC ACID, PLASMA  LACTIC  ACID, PLASMA  HEPATIC FUNCTION PANEL  BRAIN NATRIURETIC PEPTIDE  TROPONIN I (HIGH SENSITIVITY)    EKG None  Radiology DG Chest Port 1 View  Result Date: 12/29/2020 CLINICAL DATA:  Shortness of breath, fever. EXAM: PORTABLE CHEST 1 VIEW COMPARISON:  June 11, 2019. FINDINGS: Stable cardiomegaly. No pneumothorax is noted. Mild central pulmonary vascular congestion is noted with probable mild bilateral pulmonary edema. No significant pleural effusion is noted. Bony thorax is unremarkable. IMPRESSION: Stable cardiomegaly with central pulmonary vascular congestion. Mild bilateral pulmonary edema is noted. Electronically Signed   By: Lupita Raider M.D.   On: 12/29/2020 13:41    Procedures Procedures   Medications Ordered in ED Medications - No data to display  ED Course  I have reviewed the triage vital signs and the nursing notes.  Pertinent labs & imaging results that were available during my care of the patient were reviewed by me and considered in my medical decision making (see chart for details).  This patient complains of epigastric and lower chest pain and shortness of breath.  This involves an extensive number of treatment options, and is a complaint that carries with it a high risk of complications and morbidity.  The differential diagnosis includes infection including PNA vs gastritis vs ACS vs PE vs intraabdominal infection or perforation vs other  Difficult to pinpoint nidus of infection or fever.  This may still be PE, but consideration to be given to PNA not well visualized on  xray, or intra-abdominal issue such as a small perforation or colitis given his nausea and lack of BM for 2 days.  Gallbladder removed per patient's report.  He has an obese abdomen, no guarding, but difficult to obtain a reliable exam.  I ordered, reviewed, and interpreted labs.  Lactate 2.5.  WBC normal.  BMP near baseline.  BNP low 11.  Trop 5.   I ordered medication IV fluids, IV antibiotics for infection/sepsis, tylenol for fever I ordered imaging studies which included dg chest, CT PE and Abdomen I independently visualized and interpreted xray chest, showing some pulm congestion, no focal abnormalities, and the monitor tracing which showed NSR.  CT scans pending at time of signout. Additional history was obtained from patient's wife at bedside Previous records obtained and reviewed showing outpatient records   Clinical Course as of 12/29/20 1738  Fri Dec 29, 2020  1308 Notified charge RN that patient needs a room. Due to concerning vital signs [EH]  1624 Signed out to Dr Fredderick Phenix pending CT scan to look for source of infection, may be abdominal vs PNA.  IV fluid bolus ordered for sepsis, and BS IV antibiotics, given lactate 2.5 [MT]  1626 Per ideal body weight, sepsis fluid bolus is approx 2.6L.  We'll give 2L to begin, then start an infusion at 150 cc/hr, given his hx of CHF.  If lactate fails to improve or BP drops, he may need additional fluids. [MT]    Clinical Course User Index [EH] Cristina Gong, PA-C [MT] Renaye Rakers Kermit Balo, MD    Final Clinical Impression(s) / ED Diagnoses Final diagnoses:  SOB (shortness of breath)    Rx / DC Orders ED Discharge Orders    None       Renaye Rakers Kermit Balo, MD 12/29/20 [phone]

## 2020-12-30 ENCOUNTER — Inpatient Hospital Stay (HOSPITAL_COMMUNITY): Payer: 59

## 2020-12-30 ENCOUNTER — Encounter (HOSPITAL_COMMUNITY): Payer: Self-pay | Admitting: Internal Medicine

## 2020-12-30 DIAGNOSIS — M7989 Other specified soft tissue disorders: Secondary | ICD-10-CM

## 2020-12-30 DIAGNOSIS — E872 Acidosis, unspecified: Secondary | ICD-10-CM

## 2020-12-30 DIAGNOSIS — I10 Essential (primary) hypertension: Secondary | ICD-10-CM

## 2020-12-30 DIAGNOSIS — R609 Edema, unspecified: Secondary | ICD-10-CM | POA: Diagnosis not present

## 2020-12-30 DIAGNOSIS — I5030 Unspecified diastolic (congestive) heart failure: Secondary | ICD-10-CM

## 2020-12-30 DIAGNOSIS — J449 Chronic obstructive pulmonary disease, unspecified: Secondary | ICD-10-CM

## 2020-12-30 DIAGNOSIS — G4733 Obstructive sleep apnea (adult) (pediatric): Secondary | ICD-10-CM

## 2020-12-30 DIAGNOSIS — R6510 Systemic inflammatory response syndrome (SIRS) of non-infectious origin without acute organ dysfunction: Secondary | ICD-10-CM

## 2020-12-30 DIAGNOSIS — R0789 Other chest pain: Secondary | ICD-10-CM

## 2020-12-30 DIAGNOSIS — R109 Unspecified abdominal pain: Secondary | ICD-10-CM

## 2020-12-30 DIAGNOSIS — E8809 Other disorders of plasma-protein metabolism, not elsewhere classified: Secondary | ICD-10-CM

## 2020-12-30 HISTORY — DX: Chronic obstructive pulmonary disease, unspecified: J44.9

## 2020-12-30 LAB — CBC
HCT: 44.1 % (ref 39.0–52.0)
HCT: 43 % (ref 39.0–52.0)
Hemoglobin: 13.1 g/dL (ref 13.0–17.0)
Hemoglobin: 12.6 g/dL — ABNORMAL LOW (ref 13.0–17.0)
MCH: 28.6 pg (ref 26.0–34.0)
MCH: 28.4 pg (ref 26.0–34.0)
MCHC: 29.7 g/dL — ABNORMAL LOW (ref 30.0–36.0)
MCHC: 29.3 g/dL — ABNORMAL LOW (ref 30.0–36.0)
MCV: 96.3 fL (ref 80.0–100.0)
MCV: 97.1 fL (ref 80.0–100.0)
Platelets: 146 10*3/uL — ABNORMAL LOW (ref 150–400)
Platelets: 156 10*3/uL (ref 150–400)
RBC: 4.58 MIL/uL (ref 4.22–5.81)
RBC: 4.43 MIL/uL (ref 4.22–5.81)
RDW: 14 % (ref 11.5–15.5)
RDW: 14.1 % (ref 11.5–15.5)
WBC: 6.7 10*3/uL (ref 4.0–10.5)
WBC: 6.5 10*3/uL (ref 4.0–10.5)
nRBC: 0 % (ref 0.0–0.2)
nRBC: 0 % (ref 0.0–0.2)

## 2020-12-30 LAB — BLOOD GAS, ARTERIAL
Acid-Base Excess: 7.9 mmol/L — ABNORMAL HIGH (ref 0.0–2.0)
Bicarbonate: 34.6 mmol/L — ABNORMAL HIGH (ref 20.0–28.0)
Drawn by: 55062
FIO2: 60
O2 Saturation: 98.5 %
Patient temperature: 37.4
pCO2 arterial: 79.7 mmHg (ref 32.0–48.0)
pH, Arterial: 7.263 — ABNORMAL LOW (ref 7.350–7.450)
pO2, Arterial: 137 mmHg — ABNORMAL HIGH (ref 83.0–108.0)

## 2020-12-30 LAB — COMPREHENSIVE METABOLIC PANEL
ALT: 28 U/L (ref 0–44)
AST: 27 U/L (ref 15–41)
Albumin: 2.7 g/dL — ABNORMAL LOW (ref 3.5–5.0)
Alkaline Phosphatase: 66 U/L (ref 38–126)
Anion gap: 5 (ref 5–15)
BUN: 11 mg/dL (ref 8–23)
CO2: 35 mmol/L — ABNORMAL HIGH (ref 22–32)
Calcium: 7.6 mg/dL — ABNORMAL LOW (ref 8.9–10.3)
Chloride: 94 mmol/L — ABNORMAL LOW (ref 98–111)
Creatinine, Ser: 1.13 mg/dL (ref 0.61–1.24)
GFR, Estimated: >60 mL/min (ref >60)
Glucose, Bld: 119 mg/dL — ABNORMAL HIGH (ref 70–99)
Potassium: 3.8 mmol/L (ref 3.5–5.1)
Sodium: 134 mmol/L — ABNORMAL LOW (ref 135–145)
Total Bilirubin: 0.8 mg/dL (ref 0.3–1.2)
Total Protein: 7.1 g/dL (ref 6.5–8.1)

## 2020-12-30 LAB — TROPONIN I (HIGH SENSITIVITY): Troponin I (High Sensitivity): 4 ng/L (ref <18)

## 2020-12-30 LAB — CREATININE, SERUM
Creatinine, Ser: 0.98 mg/dL (ref 0.61–1.24)
GFR, Estimated: >60 mL/min (ref >60)

## 2020-12-30 LAB — HIV ANTIBODY (ROUTINE TESTING W REFLEX): HIV Screen 4th Generation wRfx: NONREACTIVE

## 2020-12-30 LAB — APTT: aPTT: 32 seconds (ref 24–36)

## 2020-12-30 LAB — LACTIC ACID, PLASMA
Lactic Acid, Venous: 0.7 mmol/L (ref 0.5–1.9)
Lactic Acid, Venous: 0.9 mmol/L (ref 0.5–1.9)

## 2020-12-30 LAB — PROTIME-INR
INR: 1.1 (ref 0.8–1.2)
Prothrombin Time: 13.8 seconds (ref 11.4–15.2)

## 2020-12-30 LAB — PROCALCITONIN
Procalcitonin: <0.1 ng/mL
Procalcitonin: <0.1 ng/mL

## 2020-12-30 LAB — MAGNESIUM: Magnesium: 1.9 mg/dL (ref 1.7–2.4)

## 2020-12-30 LAB — PHOSPHORUS: Phosphorus: 4.5 mg/dL (ref 2.5–4.6)

## 2020-12-30 MED ORDER — ACETAMINOPHEN 325 MG PO TABS: 650.0000 mg | ORAL_TABLET | Freq: Four times a day (QID) | ORAL | Status: DC | PRN

## 2020-12-30 MED ORDER — IPRATROPIUM-ALBUTEROL 0.5-2.5 (3) MG/3ML IN SOLN: 3.0000 mL | Freq: Four times a day (QID) | RESPIRATORY_TRACT | Status: DC | PRN

## 2020-12-30 MED ORDER — ALBUTEROL SULFATE HFA 108 (90 BASE) MCG/ACT IN AERS: 2.0000 | INHALATION_SPRAY | Freq: Four times a day (QID) | RESPIRATORY_TRACT | Status: DC | PRN

## 2020-12-30 MED ORDER — ENSURE ENLIVE PO LIQD
237.0000 mL | Freq: Two times a day (BID) | ORAL | Status: DC
  Administered 2020-12-31 – 2021-01-02 (×5): 237 mL via ORAL

## 2020-12-30 MED ORDER — FUROSEMIDE 10 MG/ML IJ SOLN: 80.0000 mg | Freq: Two times a day (BID) | INTRAMUSCULAR | Status: DC | PRN

## 2020-12-30 MED ORDER — SODIUM CHLORIDE 0.9 % IV SOLN: Freq: Once | INTRAVENOUS | Status: AC

## 2020-12-30 NOTE — Progress Notes (Signed)
PROGRESS NOTE    Jeffrey Skinner  JOI:[id number] DOB: October 27, 1955 DOA: 12/29/2020 PCP: Kallie Locks, FNP  Outpatient Specialists:   Brief Narrative:  As per H&P done by Dr. Thomes Dinning: "Jeffrey Skinner is a 65 y.o. male with medical history significant for COPD on supplemental oxygen of 3 LPM at baseline, hypertension, HFpEF (LVEF 60 to 65% on 06/10/2019), OSA who presents to the emergency department due to shortness of breath and chest pain.  Patient endorsed baseline shortness of breath that has been ongoing for several months.  He complained of new onset of low bilateral chest pain and upper abdominal chest pain which started yesterday in the evening and continued till this morning, pain was persistent, it was rated as 8/10 on pain scale and was associated with worsening shortness of breath.  Patient endorsed chronic leg swelling without any new leg swelling.  He states that he has been compliant with his medication.  He denies nausea, vomiting, headache, diarrhea.  Patient states that he already had his Covid vaccines.  ED Course:  In the emergency department, he was febrile with a temperature of 101F, he was tachycardic and tachypneic.  Work-up in the ED showed normal CBC and BMP.  BNP 11.2, troponin x1 was negative, lactic acid was 2.5.  SARS coronavirus 2 was negative. CT angiography of chest ruled out pulmonary embolus CT abdomen and pelvis showed nonobstructive bilateral nephrolithiasis and bilateral fat-containing inguinal hernias with no findings suggestive of ischemia associated bowel obstruction. Patient was treated with IV vancomycin and Zosyn.  IV hydration was provided.  Hospitalist was asked to admit patient for further evaluation and management".  4222: Patient seen.  Available records reviewed.  Patient also has history of chronic diastolic CHF and right ventricular heart failure with overload.  Prior to presentation, patient had orthopnea, worsening dyspnea on exertion and worsening  shortness of breath.  Due to patient's body habitus, patient does nicely recognize worsening edema.  Patient uses oxygen at home.  Patient feels better today.  Will obtain an echocardiogram.   Assessment & Plan:   Principal Problem:   SIRS (systemic inflammatory response syndrome) (HCC) Active Problems:   Acute on chronic respiratory failure with hypoxia (HCC)   Abdominal pain   Atypical chest pain   COPD (chronic obstructive pulmonary disease) (HCC)   Obstructive sleep apnea   Essential hypertension   (HFpEF) heart failure with preserved ejection fraction (HCC)   Hypoalbuminemia   Right leg swelling   Lactic acidosis   Acute on chronic respiratory failure with hypoxia: -Reports dyspnea on exertion, chronic orthopnea, worsened recently. -Prior echocardiogram revealed diastolic dysfunction and right ventricular failure. -Cardiac BNP was within normal range.  However, chest x-ray revealed vascular congestion. -Volume is difficult to assess inpatient due to body habitus. -Possibly secondary to SIRS, 101 F noted, however, normal WBC.  Will check calcitonin as well. -Currently on IV vancomycin and Zosyn. -Results of blood cultures are pending. -We will send urine for urinalysis and cultures if indicated. -Patient uses oxygen at home.  Patient has OSA on CPAP.  Likely obesity hypoventilation syndrome. -Patient reports chronic symptoms suggestive of congestive heart failure. -Shortness of breath seems to be improving. -CTA chest is negative for pulmonary embolus. -CT abdomen result is noted. -Lactic acid is back to normal  Lactic acidosis -Lactic acid 2.5 on presentation. -Lactic acid is back to normal.   COPD (not in acute exacerbation)  Obstructive sleep apnea Continue BiPAP  History of HFpEF Stable; LVEF 60 to 65%  on echocardiogram done on  06/10/2019 BNP 11.2 (though unreliable in obese patients)  Essential hypertension BP meds will be held at this time due to soft  BP  Hypoalbuminemia Albumin 3.4, protein supplement will be provided  Morbid obesity (BMI 60.86) Patient counseled on diet and lifestyle modification Patient will need to follow-up with PCP for weight loss program  RLE swelling > LLE Lower extremity Doppler ultrasound will be done to rule out DVT   DVT prophylaxis: Lovenox Code Status: Full code Family Communication:  Disposition Plan: This will depend on hospital course   Consultants:   None  Procedures:   None.  Echocardiogram ordered  Antimicrobials:   IV vancomycin.  IV Zosyn   Subjective: Shortness of breath has improved.  Objective: Vitals:   12/30/20 0417 12/30/20 0500 12/30/20 0700 12/30/20 0858  BP: 123/74  133/78   Pulse: 72  85   Resp: 18  15   Temp: 97.7 F (36.5 C)  97.8 F (36.6 C)   TempSrc: Oral  Oral   SpO2: 100%  98% 96%  Weight:  (!) 230.4 kg    Height:  6\' 4"  (1.93 m)      Intake/Output Summary (Last 24 hours) at 12/30/2020 1825 Last data filed at 12/30/2020 0854 Gross per 24 hour  Intake 2305.59 ml  Output 700 ml  Net 1605.59 ml   Filed Weights   12/29/20 1409 12/30/20 0500  Weight: (!) 226.8 kg (!) 230.4 kg    Examination:  General exam: Appears calm and comfortable.  Patient is morbidly obese. Respiratory system: Decreased air entry globally.   Cardiovascular system: S1 & S2  Gastrointestinal system: Abdomen is morbidly obese, soft and nontender.  Organs are difficult to assess.   Central nervous system: Alert and oriented.  Patient moves all extremities.   Extremities: Chronic edema of the lower extremities.  Data Reviewed: I have personally reviewed following labs and imaging studies  CBC: Recent Labs  Lab 12/29/20 1304 12/30/20 0013 12/30/20 0419  WBC 6.2 6.7 6.5  HGB 14.6 13.1 12.6*  HCT 48.9 44.1 43.0  MCV 97.0 96.3 97.1  PLT 182 146* 156   Basic Metabolic Panel: Recent Labs  Lab 12/29/20 1304 12/30/20 0013 12/30/20 0419  NA 135  --  134*  K 3.7   --  3.8  CL 91*  --  94*  CO2 34*  --  35*  GLUCOSE 112*  --  119*  BUN <5*  --  11  CREATININE 0.94 0.98 1.13  CALCIUM 8.4*  --  7.6*  MG  --   --  1.9  PHOS  --   --  4.5   GFR: Estimated Creatinine Clearance: 136.5 mL/min (by C-G formula based on SCr of 1.13 mg/dL). Liver Function Tests: Recent Labs  Lab 12/29/20 1304 12/30/20 0419  AST 30 27  ALT 26 28  ALKPHOS 78 66  BILITOT 1.0 0.8  PROT 8.6* 7.1  ALBUMIN 3.4* 2.7*   No results for input(s): LIPASE, AMYLASE in the last 168 hours. No results for input(s): AMMONIA in the last 168 hours. Coagulation Profile: Recent Labs  Lab 12/30/20 0419  INR 1.1   Cardiac Enzymes: No results for input(s): CKTOTAL, CKMB, CKMBINDEX, TROPONINI in the last 168 hours. BNP (last 3 results) No results for input(s): PROBNP in the last 8760 hours. HbA1C: No results for input(s): HGBA1C in the last 72 hours. CBG: No results for input(s): GLUCAP in the last 168 hours. Lipid Profile: No results for input(s):  CHOL, HDL, LDLCALC, TRIG, CHOLHDL, LDLDIRECT in the last 72 hours. Thyroid Function Tests: No results for input(s): TSH, T4TOTAL, FREET4, T3FREE, THYROIDAB in the last 72 hours. Anemia Panel: No results for input(s): VITAMINB12, FOLATE, FERRITIN, TIBC, IRON, RETICCTPCT in the last 72 hours. Urine analysis:    Component Value Date/Time   COLORURINE YELLOW 06/09/2019 1340   APPEARANCEUR CLEAR 06/09/2019 1340   LABSPEC 1.013 06/09/2019 1340   PHURINE 5.0 06/09/2019 1340   GLUCOSEU NEGATIVE 06/09/2019 1340   HGBUR NEGATIVE 06/09/2019 1340   BILIRUBINUR NEGATIVE 06/09/2019 1340   KETONESUR NEGATIVE 06/09/2019 1340   PROTEINUR NEGATIVE 06/09/2019 1340   NITRITE NEGATIVE 06/09/2019 1340   LEUKOCYTESUR NEGATIVE 06/09/2019 1340   Sepsis Labs: (procalcitonin:4,lacticidven:4)  ) Recent Results (from the past 240 hour(s))  Culture, blood (routine x 2)     Status: None (Preliminary result)   Collection Time: 12/29/20   2:18 PM   Specimen: BLOOD  Result Value Ref Range Status   Specimen Description BLOOD RIGHT ANTECUBITAL  Final   Special Requests   Final    BOTTLES DRAWN AEROBIC AND ANAEROBIC Blood Culture adequate volume   Culture   Final    NO GROWTH < 24 HOURS Performed at [facility], [address] [place], [place] [address]    Report Status PENDING  Incomplete  Culture, blood (routine x 2)     Status: None (Preliminary result)   Collection Time: 12/29/20  2:30 PM   Specimen: BLOOD  Result Value Ref Range Status   Specimen Description BLOOD SITE NOT SPECIFIED  Final   Special Requests BOTTLES DRAWN AEROBIC ONLY  Final   Culture   Final    NO GROWTH < 24 HOURS Performed at [facility], [address] [place], [place] [address]    Report Status PENDING  Incomplete  Resp Panel by RT-PCR (Flu A&B, Covid) Nasopharyngeal Swab     Status: None   Collection Time: 12/29/20  3:31 PM   Specimen: Nasopharyngeal Swab; Nasopharyngeal(NP) swabs in vial transport medium  Result Value Ref Range Status   SARS Coronavirus 2 by RT PCR NEGATIVE NEGATIVE Final    Comment: (NOTE) SARS-CoV-2 target nucleic acids are NOT DETECTED.  The SARS-CoV-2 RNA is generally detectable in upper respiratory specimens during the acute phase of infection. The lowest concentration of SARS-CoV-2 viral copies this assay can detect is 138 copies/mL. A negative result does not preclude SARS-Cov-2 infection and should not be used as the sole basis for treatment or other patient management decisions. A negative result may occur with  improper specimen collection/handling, submission of specimen other than nasopharyngeal swab, presence of viral mutation(s) within the areas targeted by this assay, and inadequate number of viral copies(<138 copies/mL). A negative result must be combined with clinical observations, patient history, and epidemiological information. The expected result is Negative.  Fact Sheet for  Patients:  BloggerCourse.com  Fact Sheet for Healthcare Providers:  SeriousBroker.it  This test is no t yet approved or cleared by the [place] FDA and  has been authorized for detection and/or diagnosis of SARS-CoV-2 by FDA under an Emergency Use Authorization (EUA). This EUA will remain  in effect (meaning this test can be used) for the duration of the COVID-19 declaration under Section 564(b)(1) of the Act, 21 U.S.C.section 360bbb-3(b)(1), unless the authorization is terminated  or revoked sooner.       Influenza A by PCR NEGATIVE NEGATIVE Final   Influenza B by PCR NEGATIVE NEGATIVE Final  Comment: (NOTE) The Xpert Xpress SARS-CoV-2/FLU/RSV plus assay is intended as an aid in the diagnosis of influenza from Nasopharyngeal swab specimens and should not be used as a sole basis for treatment. Nasal washings and aspirates are unacceptable for Xpert Xpress SARS-CoV-2/FLU/RSV testing.  Fact Sheet for Patients: BloggerCourse.com  Fact Sheet for Healthcare Providers: SeriousBroker.it  This test is not yet approved or cleared by the [place] FDA and has been authorized for detection and/or diagnosis of SARS-CoV-2 by FDA under an Emergency Use Authorization (EUA). This EUA will remain in effect (meaning this test can be used) for the duration of the COVID-19 declaration under Section 564(b)(1) of the Act, 21 U.S.C. section 360bbb-3(b)(1), unless the authorization is terminated or revoked.  Performed at [facility], [address], [place], [place] [address]          Radiology Studies: CT Angio Chest PE W and/or Wo Contrast  Result Date: 12/29/2020 CLINICAL DATA:  Suspected high probability pulmonary embolus. Fever of unknown origin. Diverticulitis suspected. Cough, shortness of breath, diffuse epigastric pain, nausea, constipation. EXAM: CT ANGIOGRAPHY CHEST  CT ABDOMEN AND PELVIS WITH CONTRAST TECHNIQUE: Multidetector CT imaging of the chest was performed using the standard protocol during bolus administration of intravenous contrast. Multiplanar CT image reconstructions and MIPs were obtained to evaluate the vascular anatomy. Multidetector CT imaging of the abdomen and pelvis was performed using the standard protocol during bolus administration of intravenous contrast. CONTRAST:  OMNIPAQUE IOHEXOL 350 MG/ML SOLN COMPARISON:  CT angio chest 03/05/2018 FINDINGS: CTA CHEST FINDINGS Cardiovascular: Fair opacification of the pulmonary arteries to the proximal segmental level. No evidence of pulmonary embolism. Normal heart size. No pericardial effusion. Mediastinum/Nodes: Redemonstration of calcified right inter lobar nodes that are not large enlarged in size. No enlarged mediastinal, hilar, or axillary lymph nodes. Thyroid gland, trachea, and esophagus demonstrate no significant findings. Lungs/Pleura: Expiratory phase of respiration. Low lung volumes. Linear atelectasis. No focal consolidation. Calcified nodules at the right base. No pulmonary mass. Musculoskeletal: No chest wall abnormality. No suspicious lytic or blastic osseous lesions. No acute displaced fracture. Review of the MIP images confirms the above findings. CT ABDOMEN and PELVIS FINDINGS Hepatobiliary: No focal liver abnormality. No gallstones, gallbladder wall thickening, or pericholecystic fluid. No biliary dilatation. Pancreas: No adrenal nodule bilaterally. Bilateral kidneys enhance symmetrically. No hydronephrosis. No hydroureter. The urinary bladder is unremarkable. Spleen: Normal in size without focal abnormality. Adrenals/Urinary Tract: No adrenal nodule bilaterally. Bilateral kidneys enhance symmetrically. Bilateral punctate nephrolithiasis. Up to 3 mm calcified stone on the left. No hydronephrosis. 1.5 cm fluid density exophytic right kidney lesion that likely represents a simple renal cyst.  Otherwise no contour-deforming renal mass. No ureterolithiasis or hydroureter. The urinary bladder is unremarkable. On delayed imaging, there is no urothelial wall thickening and there are no filling defects in the opacified portions of the bilateral collecting systems or ureters. Stomach/Bowel: Stomach is within normal limits. No evidence of bowel wall thickening or dilatation. The appendix not definitely identified. Vascular/Lymphatic: No significant vascular findings are present. No enlarged abdominal or pelvic lymph nodes. Reproductive: Prostate is unremarkable. Other: No intraperitoneal free fluid. No intraperitoneal free gas. No organized fluid collection. Musculoskeletal: Bilateral fat containing small inguinal hernias. No suspicious lytic or blastic osseous lesions. No acute displaced fracture. Multilevel degenerative changes of the spine. Review of the MIP images confirms the above findings. IMPRESSION: 1. No central pulmonary embolus. Markedly limited evaluation distally due to timing of contrast. 2. No acute intrathoracic abnormality in a patient  with low lung volumes. 3. Nonobstructive bilateral nephrolithiasis. 4. Bilateral fat containing inguinal hernias with no findings suggest ischemia or associated bowel obstruction. 5. Right lung sequelae of prior granulomatous disease. Electronically Signed   By: Tish Frederickson M.D.   On: 12/29/2020 18:35   CT ABDOMEN PELVIS W CONTRAST  Result Date: 12/29/2020 CLINICAL DATA:  Suspected high probability pulmonary embolus. Fever of unknown origin. Diverticulitis suspected. Cough, shortness of breath, diffuse epigastric pain, nausea, constipation. EXAM: CT ANGIOGRAPHY CHEST CT ABDOMEN AND PELVIS WITH CONTRAST TECHNIQUE: Multidetector CT imaging of the chest was performed using the standard protocol during bolus administration of intravenous contrast. Multiplanar CT image reconstructions and MIPs were obtained to evaluate the vascular anatomy. Multidetector CT  imaging of the abdomen and pelvis was performed using the standard protocol during bolus administration of intravenous contrast. CONTRAST:  OMNIPAQUE IOHEXOL 350 MG/ML SOLN COMPARISON:  CT angio chest 03/05/2018 FINDINGS: CTA CHEST FINDINGS Cardiovascular: Fair opacification of the pulmonary arteries to the proximal segmental level. No evidence of pulmonary embolism. Normal heart size. No pericardial effusion. Mediastinum/Nodes: Redemonstration of calcified right inter lobar nodes that are not large enlarged in size. No enlarged mediastinal, hilar, or axillary lymph nodes. Thyroid gland, trachea, and esophagus demonstrate no significant findings. Lungs/Pleura: Expiratory phase of respiration. Low lung volumes. Linear atelectasis. No focal consolidation. Calcified nodules at the right base. No pulmonary mass. Musculoskeletal: No chest wall abnormality. No suspicious lytic or blastic osseous lesions. No acute displaced fracture. Review of the MIP images confirms the above findings. CT ABDOMEN and PELVIS FINDINGS Hepatobiliary: No focal liver abnormality. No gallstones, gallbladder wall thickening, or pericholecystic fluid. No biliary dilatation. Pancreas: No adrenal nodule bilaterally. Bilateral kidneys enhance symmetrically. No hydronephrosis. No hydroureter. The urinary bladder is unremarkable. Spleen: Normal in size without focal abnormality. Adrenals/Urinary Tract: No adrenal nodule bilaterally. Bilateral kidneys enhance symmetrically. Bilateral punctate nephrolithiasis. Up to 3 mm calcified stone on the left. No hydronephrosis. 1.5 cm fluid density exophytic right kidney lesion that likely represents a simple renal cyst. Otherwise no contour-deforming renal mass. No ureterolithiasis or hydroureter. The urinary bladder is unremarkable. On delayed imaging, there is no urothelial wall thickening and there are no filling defects in the opacified portions of the bilateral collecting systems or ureters.  Stomach/Bowel: Stomach is within normal limits. No evidence of bowel wall thickening or dilatation. The appendix not definitely identified. Vascular/Lymphatic: No significant vascular findings are present. No enlarged abdominal or pelvic lymph nodes. Reproductive: Prostate is unremarkable. Other: No intraperitoneal free fluid. No intraperitoneal free gas. No organized fluid collection. Musculoskeletal: Bilateral fat containing small inguinal hernias. No suspicious lytic or blastic osseous lesions. No acute displaced fracture. Multilevel degenerative changes of the spine. Review of the MIP images confirms the above findings. IMPRESSION: 1. No central pulmonary embolus. Markedly limited evaluation distally due to timing of contrast. 2. No acute intrathoracic abnormality in a patient with low lung volumes. 3. Nonobstructive bilateral nephrolithiasis. 4. Bilateral fat containing inguinal hernias with no findings suggest ischemia or associated bowel obstruction. 5. Right lung sequelae of prior granulomatous disease. Electronically Signed   By: Tish Frederickson M.D.   On: 12/29/2020 18:35   DG Chest Port 1 View  Result Date: 12/29/2020 CLINICAL DATA:  Shortness of breath, fever. EXAM: PORTABLE CHEST 1 VIEW COMPARISON:  June 11, 2019. FINDINGS: Stable cardiomegaly. No pneumothorax is noted. Mild central pulmonary vascular congestion is noted with probable mild bilateral pulmonary edema. No significant pleural effusion is noted. Bony thorax is unremarkable. IMPRESSION: Stable cardiomegaly  with central pulmonary vascular congestion. Mild bilateral pulmonary edema is noted. Electronically Signed   By: Lupita Raider M.D.   On: 12/29/2020 13:41   VAS [place] LOWER EXTREMITY VENOUS (DVT)  Result Date: 12/30/2020  Lower Venous DVT Study Indications: Edema, R>L.  Risk Factors: Patient on BiPap. Limitations: BMI >60 and body habitus. Comparison       Prior Bilateral LE venous duplex done 06/10/19 was Study:            limited/negative Performing Technologist: Sherren Kerns RVS  Examination Guidelines: A complete evaluation includes B-mode imaging, spectral Doppler, color Doppler, and power Doppler as needed of all accessible portions of each vessel. Bilateral testing is considered an integral part of a complete examination. Limited examinations for reoccurring indications may be performed as noted. The reflux portion of the exam is performed with the patient in reverse Trendelenburg.  +---------+---------------+---------+-----------+----------+--------------+ RIGHT    CompressibilityPhasicitySpontaneityPropertiesThrombus Aging +---------+---------------+---------+-----------+----------+--------------+ CFV      Full           Yes      Yes                                 +---------+---------------+---------+-----------+----------+--------------+ SFJ      Full                                                        +---------+---------------+---------+-----------+----------+--------------+ FV Prox  Full                                                        +---------+---------------+---------+-----------+----------+--------------+ FV Mid   Full                                                        +---------+---------------+---------+-----------+----------+--------------+ FV DistalFull                                                        +---------+---------------+---------+-----------+----------+--------------+ PFV      Full                                                        +---------+---------------+---------+-----------+----------+--------------+ POP      Full           Yes      Yes                                 +---------+---------------+---------+-----------+----------+--------------+ PTV      Full                                                        +---------+---------------+---------+-----------+----------+--------------+  PERO     Full                                                         +---------+---------------+---------+-----------+----------+--------------+   Left Technical Findings: Left leg not evaluated.   Summary: RIGHT: - There is no evidence of deep vein thrombosis in the lower extremity.   *See table(s) above for measurements and observations. Electronically signed by Sherald Hesshristopher Clark MD on 12/30/2020 at 5:11:48 PM.    Final         Scheduled Meds: . enoxaparin (LOVENOX) injection  110 mg Subcutaneous Daily  . feeding supplement  237 mL Oral BID BM   Continuous Infusions: . piperacillin-tazobactam (ZOSYN)  IV 3.375 g (12/30/20 1719)  . vancomycin 2,000 mg (12/30/20 1717)     LOS: 1 day    Time spent: 35 minutes    Jeffrey MountSylvester Ransom Nickson, MD  [facility] Pager #: [phone] 7PM-7AM contact night coverage as above

## 2020-12-30 NOTE — Progress Notes (Signed)
Pt arrived from ED. Placed on Bipap by RT. Pt too drowsy to answer admission questions but orientedx4.  0030 lab notified about critical PCO2 value of 79.7. MD notified.

## 2020-12-30 NOTE — Progress Notes (Signed)
VASCULAR LAB    Right lower extremity venous duplex has been performed.  See CV proc for preliminary results.   Seward Coran, RVT 12/30/2020, 12:37 PM

## 2020-12-31 ENCOUNTER — Inpatient Hospital Stay (HOSPITAL_COMMUNITY): Payer: 59

## 2020-12-31 DIAGNOSIS — R6510 Systemic inflammatory response syndrome (SIRS) of non-infectious origin without acute organ dysfunction: Secondary | ICD-10-CM | POA: Diagnosis not present

## 2020-12-31 LAB — CBC WITH DIFFERENTIAL/PLATELET
Abs Immature Granulocytes: 0.03 10*3/uL (ref 0.00–0.07)
Basophils Absolute: 0.1 10*3/uL (ref 0.0–0.1)
Basophils Relative: 1 %
Eosinophils Absolute: 0.2 10*3/uL (ref 0.0–0.5)
Eosinophils Relative: 2 %
HCT: 40.7 % (ref 39.0–52.0)
Hemoglobin: 11.9 g/dL — ABNORMAL LOW (ref 13.0–17.0)
Immature Granulocytes: 0 %
Lymphocytes Relative: 17 %
Lymphs Abs: 1.4 10*3/uL (ref 0.7–4.0)
MCH: 28.5 pg (ref 26.0–34.0)
MCHC: 29.2 g/dL — ABNORMAL LOW (ref 30.0–36.0)
MCV: 97.6 fL (ref 80.0–100.0)
Monocytes Absolute: 1.6 10*3/uL — ABNORMAL HIGH (ref 0.1–1.0)
Monocytes Relative: 19 %
Neutro Abs: 5 10*3/uL (ref 1.7–7.7)
Neutrophils Relative %: 61 %
Platelets: 153 10*3/uL (ref 150–400)
RBC: 4.17 MIL/uL — ABNORMAL LOW (ref 4.22–5.81)
RDW: 13.7 % (ref 11.5–15.5)
WBC: 8.3 10*3/uL (ref 4.0–10.5)
nRBC: 0 % (ref 0.0–0.2)

## 2020-12-31 LAB — URINALYSIS, ROUTINE W REFLEX MICROSCOPIC
Bilirubin Urine: NEGATIVE
Glucose, UA: NEGATIVE mg/dL
Hgb urine dipstick: NEGATIVE
Ketones, ur: NEGATIVE mg/dL
Leukocytes,Ua: NEGATIVE
Nitrite: NEGATIVE
Protein, ur: NEGATIVE mg/dL
Specific Gravity, Urine: 1.034 — ABNORMAL HIGH (ref 1.005–1.030)
pH: 5 (ref 5.0–8.0)

## 2020-12-31 LAB — MAGNESIUM: Magnesium: 2 mg/dL (ref 1.7–2.4)

## 2020-12-31 LAB — COMPREHENSIVE METABOLIC PANEL
ALT: 23 U/L (ref 0–44)
AST: 23 U/L (ref 15–41)
Albumin: 2.6 g/dL — ABNORMAL LOW (ref 3.5–5.0)
Alkaline Phosphatase: 59 U/L (ref 38–126)
Anion gap: 5 (ref 5–15)
BUN: 10 mg/dL (ref 8–23)
CO2: 36 mmol/L — ABNORMAL HIGH (ref 22–32)
Calcium: 8 mg/dL — ABNORMAL LOW (ref 8.9–10.3)
Chloride: 98 mmol/L (ref 98–111)
Creatinine, Ser: 1.05 mg/dL (ref 0.61–1.24)
GFR, Estimated: >60 mL/min (ref >60)
Glucose, Bld: 117 mg/dL — ABNORMAL HIGH (ref 70–99)
Potassium: 4.5 mmol/L (ref 3.5–5.1)
Sodium: 139 mmol/L (ref 135–145)
Total Bilirubin: 0.8 mg/dL (ref 0.3–1.2)
Total Protein: 6.9 g/dL (ref 6.5–8.1)

## 2020-12-31 LAB — PROCALCITONIN: Procalcitonin: <0.1 ng/mL

## 2020-12-31 LAB — PHOSPHORUS: Phosphorus: 3.6 mg/dL (ref 2.5–4.6)

## 2020-12-31 MED ORDER — IPRATROPIUM-ALBUTEROL 0.5-2.5 (3) MG/3ML IN SOLN
3.0000 mL | Freq: Four times a day (QID) | RESPIRATORY_TRACT | Status: DC
  Administered 2020-12-31: 3 mL via RESPIRATORY_TRACT
  Filled 2020-12-31: qty 3

## 2020-12-31 MED ORDER — BUDESONIDE 0.25 MG/2ML IN SUSP
0.2500 mg | Freq: Two times a day (BID) | RESPIRATORY_TRACT | Status: DC
  Administered 2020-12-31 – 2021-01-01 (×3): 0.25 mg via RESPIRATORY_TRACT
  Filled 2020-12-31 (×4): qty 2

## 2020-12-31 NOTE — Progress Notes (Signed)
PROGRESS NOTE    Jeffrey Skinner  ZOX:[id number] DOB: 15-Nov-1955 DOA: 12/29/2020 PCP: Kallie Locks, FNP  Outpatient Specialists:   Brief Narrative:  As per H&P done by Dr. Thomes Dinning: "Jeffrey Skinner is a 65 y.o. male with medical history significant for COPD on supplemental oxygen of 3 LPM at baseline, hypertension, HFpEF (LVEF 60 to 65% on 06/10/2019), OSA who presents to the emergency department due to shortness of breath and chest pain.  Patient endorsed baseline shortness of breath that has been ongoing for several months.  He complained of new onset of low bilateral chest pain and upper abdominal chest pain which started yesterday in the evening and continued till this morning, pain was persistent, it was rated as 8/10 on pain scale and was associated with worsening shortness of breath.  Patient endorsed chronic leg swelling without any new leg swelling.  He states that he has been compliant with his medication.  He denies nausea, vomiting, headache, diarrhea.  Patient states that he already had his Covid vaccines.  ED Course:  In the emergency department, he was febrile with a temperature of 101F, he was tachycardic and tachypneic.  Work-up in the ED showed normal CBC and BMP.  BNP 11.2, troponin x1 was negative, lactic acid was 2.5.  SARS coronavirus 2 was negative. CT angiography of chest ruled out pulmonary embolus CT abdomen and pelvis showed nonobstructive bilateral nephrolithiasis and bilateral fat-containing inguinal hernias with no findings suggestive of ischemia associated bowel obstruction. Patient was treated with IV vancomycin and Zosyn.  IV hydration was provided.  Hospitalist was asked to admit patient for further evaluation and management".  12/30/20: Patient seen.  Available records reviewed.  Patient also has history of chronic diastolic CHF and right ventricular heart failure with overload.  Prior to presentation, patient had orthopnea, worsening dyspnea on exertion and worsening  shortness of breath.  Due to patient's body habitus, patient does nicely recognize worsening edema.  Patient uses oxygen at home.  Patient feels better today.  Will obtain an echocardiogram.  12/31/2020: Patient seen.  No fever or chills.  Patient reported dry cough.  No leukocytosis.  Blood cultures negative to date.  We will discontinue IV vancomycin.  Will consult PT OT.  Possible discharge on oral Augmentin.  Will start patient on nebs DuoNeb and Pulmicort.  We will follow echocardiogram report.  Assessment & Plan:   Principal Problem:   SIRS (systemic inflammatory response syndrome) (HCC) Active Problems:   Acute on chronic respiratory failure with hypoxia (HCC)   Abdominal pain   Atypical chest pain   COPD (chronic obstructive pulmonary disease) (HCC)   Obstructive sleep apnea   Essential hypertension   (HFpEF) heart failure with preserved ejection fraction (HCC)   Hypoalbuminemia   Right leg swelling   Lactic acidosis   Acute on chronic respiratory failure with hypoxia: -Reports dyspnea on exertion, chronic orthopnea, worsened recently. -Prior echocardiogram revealed diastolic dysfunction and right ventricular failure. -Cardiac BNP was within normal range.  However, chest x-ray revealed vascular congestion. -Volume is difficult to assess inpatient due to body habitus. -Possibly secondary to SIRS, 101 F noted, however, normal WBC.  Will check calcitonin as well. -Currently on IV vancomycin and Zosyn. -Results of blood cultures are pending. -We will send urine for urinalysis and cultures if indicated. -Patient uses oxygen at home.  Patient has OSA on CPAP.  Likely obesity hypoventilation syndrome. -Patient reports chronic symptoms suggestive of congestive heart failure. -Shortness of breath seems to be improving. -  CTA chest is negative for pulmonary embolus. -CT abdomen result is noted. -Lactic acid is back to normal 12/31/2020: Start nebs Pulmicort 0.25 mg twice daily and DuoNeb  1 unit every 6 hours.  SIRS: -Cultures have been negative. -No fever in the last 24 to 36 hours. -No leukocytosis. -Stable blood pressure. -We will discontinue IV of vancomycin. -Normal procalcitonin. -Likely discharge home on oral Augmentin. -We will consult PT OT.  Lactic acidosis -Lactic acid 2.5 on presentation. -Lactic acid is back to normal.   COPD (not in acute exacerbation): -See above.  Obstructive sleep apnea Continue BiPAP  History of HFpEF Stable; LVEF 60 to 65% on echocardiogram done on  06/10/2019 BNP 11.2 (though unreliable in obese patients)  Essential hypertension BP meds will be held at this time due to soft BP  Hypoalbuminemia Albumin 3.4, protein supplement will be provided  Morbid obesity (BMI 60.86) Patient counseled on diet and lifestyle modification Patient will need to follow-up with PCP for weight loss program  RLE swelling > LLE Lower extremity Doppler ultrasound will be done to rule out DVT   DVT prophylaxis: Lovenox Code Status: Full code Family Communication:  Disposition Plan: This will depend on hospital course   Consultants:   None  Procedures:   None.  Echocardiogram ordered  Antimicrobials:   IV vancomycin.  IV Zosyn   Subjective: Shortness of breath has improved.  Objective: Vitals:   12/30/20 2000 12/31/20 0400 12/31/20 0741 12/31/20 0840  BP: 121/86 102/61 (P) 116/71   Pulse: 79 79 (P) 77   Resp: 19 13 (P) 18   Temp: 98.2 F (36.8 C) 98.5 F (36.9 C) (P) 98.7 F (37.1 C)   TempSrc: Oral Axillary (P) Oral   SpO2: 95% 98% (P) 100% 94%  Weight:  (!) 231.8 kg    Height:        Intake/Output Summary (Last 24 hours) at 12/31/2020 1540 Last data filed at 12/31/2020 0600 Gross per 24 hour  Intake --  Output 501 ml  Net -501 ml   Filed Weights   12/29/20 1409 12/30/20 0500 12/31/20 0400  Weight: (!) 226.8 kg (!) 230.4 kg (!) 231.8 kg    Examination:  General exam: Appears calm and  comfortable.  Patient is morbidly obese. Respiratory system: Decreased air entry globally.   Cardiovascular system: S1 & S2  Gastrointestinal system: Abdomen is morbidly obese, soft and nontender.  Organs are difficult to assess.   Central nervous system: Alert and oriented.  Patient moves all extremities.   Extremities: Chronic edema of the lower extremities.  Data Reviewed: I have personally reviewed following labs and imaging studies  CBC: Recent Labs  Lab 12/29/20 1304 12/30/20 0013 12/30/20 0419 12/31/20 0334  WBC 6.2 6.7 6.5 8.3  NEUTROABS  --   --   --  5.0  HGB 14.6 13.1 12.6* 11.9*  HCT 48.9 44.1 43.0 40.7  MCV 97.0 96.3 97.1 97.6  PLT 182 146* 156 153   Basic Metabolic Panel: Recent Labs  Lab 12/29/20 1304 12/30/20 0013 12/30/20 0419 12/31/20 0334  NA 135  --  134* 139  K 3.7  --  3.8 4.5  CL 91*  --  94* 98  CO2 34*  --  35* 36*  GLUCOSE 112*  --  119* 117*  BUN <5*  --  11 10  CREATININE 0.94 0.98 1.13 1.05  CALCIUM 8.4*  --  7.6* 8.0*  MG  --   --  1.9 2.0  PHOS  --   --  4.5 3.6   GFR: Estimated Creatinine Clearance: 147.5 mL/min (by C-G formula based on SCr of 1.05 mg/dL). Liver Function Tests: Recent Labs  Lab 12/29/20 1304 12/30/20 0419 12/31/20 0334  AST 30 27 23   ALT 26 28 23   ALKPHOS 78 66 59  BILITOT 1.0 0.8 0.8  PROT 8.6* 7.1 6.9  ALBUMIN 3.4* 2.7* 2.6*   No results for input(s): LIPASE, AMYLASE in the last 168 hours. No results for input(s): AMMONIA in the last 168 hours. Coagulation Profile: Recent Labs  Lab 12/30/20 0419  INR 1.1   Cardiac Enzymes: No results for input(s): CKTOTAL, CKMB, CKMBINDEX, TROPONINI in the last 168 hours. BNP (last 3 results) No results for input(s): PROBNP in the last 8760 hours. HbA1C: No results for input(s): HGBA1C in the last 72 hours. CBG: No results for input(s): GLUCAP in the last 168 hours. Lipid Profile: No results for input(s): CHOL, HDL, LDLCALC, TRIG, CHOLHDL, LDLDIRECT in the last  72 hours. Thyroid Function Tests: No results for input(s): TSH, T4TOTAL, FREET4, T3FREE, THYROIDAB in the last 72 hours. Anemia Panel: No results for input(s): VITAMINB12, FOLATE, FERRITIN, TIBC, IRON, RETICCTPCT in the last 72 hours. Urine analysis:    Component Value Date/Time   COLORURINE YELLOW 06/09/2019 1340   APPEARANCEUR CLEAR 06/09/2019 1340   LABSPEC 1.013 06/09/2019 1340   PHURINE 5.0 06/09/2019 1340   GLUCOSEU NEGATIVE 06/09/2019 1340   HGBUR NEGATIVE 06/09/2019 1340   BILIRUBINUR NEGATIVE 06/09/2019 1340   KETONESUR NEGATIVE 06/09/2019 1340   PROTEINUR NEGATIVE 06/09/2019 1340   NITRITE NEGATIVE 06/09/2019 1340   LEUKOCYTESUR NEGATIVE 06/09/2019 1340   Sepsis Labs: @LABRCNTIP (procalcitonin:4,lacticidven:4)  ) Recent Results (from the past 240 hour(s))  Culture, blood (routine x 2)     Status: None (Preliminary result)   Collection Time: 12/29/20  2:18 PM   Specimen: BLOOD  Result Value Ref Range Status   Specimen Description BLOOD RIGHT ANTECUBITAL  Final   Special Requests   Final    BOTTLES DRAWN AEROBIC AND ANAEROBIC Blood Culture adequate volume   Culture   Final    NO GROWTH 2 DAYS Performed at [facility], [address], [place], [facility] [address]    Report Status PENDING  Incomplete  Culture, blood (routine x 2)     Status: None (Preliminary result)   Collection Time: 12/29/20  2:30 PM   Specimen: BLOOD  Result Value Ref Range Status   Specimen Description BLOOD SITE NOT SPECIFIED  Final   Special Requests BOTTLES DRAWN AEROBIC ONLY  Final   Culture   Final    NO GROWTH 2 DAYS Performed at [facility], [address], [place], [facility] [address]    Report Status PENDING  Incomplete  Resp Panel by RT-PCR (Flu A&B, Covid) Nasopharyngeal Swab     Status: None   Collection Time: 12/29/20  3:31 PM   Specimen: Nasopharyngeal Swab; Nasopharyngeal(NP) swabs in vial transport medium  Result Value Ref Range Status   SARS Coronavirus 2 by RT  PCR NEGATIVE NEGATIVE Final    Comment: (NOTE) SARS-CoV-2 target nucleic acids are NOT DETECTED.  The SARS-CoV-2 RNA is generally detectable in upper respiratory specimens during the acute phase of infection. The lowest concentration of SARS-CoV-2 viral copies this assay can detect is 138 copies/mL. A negative result does not preclude SARS-Cov-2 infection and should not be used as the sole basis for treatment or other patient management decisions. A negative result may occur with  improper specimen collection/handling, submission  of specimen other than nasopharyngeal swab, presence of viral mutation(s) within the areas targeted by this assay, and inadequate number of viral copies(<138 copies/mL). A negative result must be combined with clinical observations, patient history, and epidemiological information. The expected result is Negative.  Fact Sheet for Patients:  BloggerCourse.com  Fact Sheet for Healthcare Providers:  SeriousBroker.it  This test is no t yet approved or cleared by the [place] FDA and  has been authorized for detection and/or diagnosis of SARS-CoV-2 by FDA under an Emergency Use Authorization (EUA). This EUA will remain  in effect (meaning this test can be used) for the duration of the COVID-19 declaration under Section 564(b)(1) of the Act, 21 U.S.C.section 360bbb-3(b)(1), unless the authorization is terminated  or revoked sooner.       Influenza A by PCR NEGATIVE NEGATIVE Final   Influenza B by PCR NEGATIVE NEGATIVE Final    Comment: (NOTE) The Xpert Xpress SARS-CoV-2/FLU/RSV plus assay is intended as an aid in the diagnosis of influenza from Nasopharyngeal swab specimens and should not be used as a sole basis for treatment. Nasal washings and aspirates are unacceptable for Xpert Xpress SARS-CoV-2/FLU/RSV testing.  Fact Sheet for Patients: BloggerCourse.com  Fact Sheet for  Healthcare Providers: SeriousBroker.it  This test is not yet approved or cleared by the [place] FDA and has been authorized for detection and/or diagnosis of SARS-CoV-2 by FDA under an Emergency Use Authorization (EUA). This EUA will remain in effect (meaning this test can be used) for the duration of the COVID-19 declaration under Section 564(b)(1) of the Act, 21 U.S.C. section 360bbb-3(b)(1), unless the authorization is terminated or revoked.  Performed at [facility], [address] [place], [place] [address]          Radiology Studies: CT Angio Chest PE W and/or Wo Contrast  Result Date: 12/29/2020 CLINICAL DATA:  Suspected high probability pulmonary embolus. Fever of unknown origin. Diverticulitis suspected. Cough, shortness of breath, diffuse epigastric pain, nausea, constipation. EXAM: CT ANGIOGRAPHY CHEST CT ABDOMEN AND PELVIS WITH CONTRAST TECHNIQUE: Multidetector CT imaging of the chest was performed using the standard protocol during bolus administration of intravenous contrast. Multiplanar CT image reconstructions and MIPs were obtained to evaluate the vascular anatomy. Multidetector CT imaging of the abdomen and pelvis was performed using the standard protocol during bolus administration of intravenous contrast. CONTRAST:  OMNIPAQUE IOHEXOL 350 MG/ML SOLN COMPARISON:  CT angio chest 03/05/2018 FINDINGS: CTA CHEST FINDINGS Cardiovascular: Fair opacification of the pulmonary arteries to the proximal segmental level. No evidence of pulmonary embolism. Normal heart size. No pericardial effusion. Mediastinum/Nodes: Redemonstration of calcified right inter lobar nodes that are not large enlarged in size. No enlarged mediastinal, hilar, or axillary lymph nodes. Thyroid gland, trachea, and esophagus demonstrate no significant findings. Lungs/Pleura: Expiratory phase of respiration. Low lung volumes. Linear atelectasis. No focal consolidation.  Calcified nodules at the right base. No pulmonary mass. Musculoskeletal: No chest wall abnormality. No suspicious lytic or blastic osseous lesions. No acute displaced fracture. Review of the MIP images confirms the above findings. CT ABDOMEN and PELVIS FINDINGS Hepatobiliary: No focal liver abnormality. No gallstones, gallbladder wall thickening, or pericholecystic fluid. No biliary dilatation. Pancreas: No adrenal nodule bilaterally. Bilateral kidneys enhance symmetrically. No hydronephrosis. No hydroureter. The urinary bladder is unremarkable. Spleen: Normal in size without focal abnormality. Adrenals/Urinary Tract: No adrenal nodule bilaterally. Bilateral kidneys enhance symmetrically. Bilateral punctate nephrolithiasis. Up to 3 mm calcified stone on the left. No hydronephrosis. 1.5 cm fluid density exophytic right  kidney lesion that likely represents a simple renal cyst. Otherwise no contour-deforming renal mass. No ureterolithiasis or hydroureter. The urinary bladder is unremarkable. On delayed imaging, there is no urothelial wall thickening and there are no filling defects in the opacified portions of the bilateral collecting systems or ureters. Stomach/Bowel: Stomach is within normal limits. No evidence of bowel wall thickening or dilatation. The appendix not definitely identified. Vascular/Lymphatic: No significant vascular findings are present. No enlarged abdominal or pelvic lymph nodes. Reproductive: Prostate is unremarkable. Other: No intraperitoneal free fluid. No intraperitoneal free gas. No organized fluid collection. Musculoskeletal: Bilateral fat containing small inguinal hernias. No suspicious lytic or blastic osseous lesions. No acute displaced fracture. Multilevel degenerative changes of the spine. Review of the MIP images confirms the above findings. IMPRESSION: 1. No central pulmonary embolus. Markedly limited evaluation distally due to timing of contrast. 2. No acute intrathoracic abnormality  in a patient with low lung volumes. 3. Nonobstructive bilateral nephrolithiasis. 4. Bilateral fat containing inguinal hernias with no findings suggest ischemia or associated bowel obstruction. 5. Right lung sequelae of prior granulomatous disease. Electronically Signed   By: Tish Frederickson M.D.   On: 12/29/2020 18:35   CT ABDOMEN PELVIS W CONTRAST  Result Date: 12/29/2020 CLINICAL DATA:  Suspected high probability pulmonary embolus. Fever of unknown origin. Diverticulitis suspected. Cough, shortness of breath, diffuse epigastric pain, nausea, constipation. EXAM: CT ANGIOGRAPHY CHEST CT ABDOMEN AND PELVIS WITH CONTRAST TECHNIQUE: Multidetector CT imaging of the chest was performed using the standard protocol during bolus administration of intravenous contrast. Multiplanar CT image reconstructions and MIPs were obtained to evaluate the vascular anatomy. Multidetector CT imaging of the abdomen and pelvis was performed using the standard protocol during bolus administration of intravenous contrast. CONTRAST:  OMNIPAQUE IOHEXOL 350 MG/ML SOLN COMPARISON:  CT angio chest 03/05/2018 FINDINGS: CTA CHEST FINDINGS Cardiovascular: Fair opacification of the pulmonary arteries to the proximal segmental level. No evidence of pulmonary embolism. Normal heart size. No pericardial effusion. Mediastinum/Nodes: Redemonstration of calcified right inter lobar nodes that are not large enlarged in size. No enlarged mediastinal, hilar, or axillary lymph nodes. Thyroid gland, trachea, and esophagus demonstrate no significant findings. Lungs/Pleura: Expiratory phase of respiration. Low lung volumes. Linear atelectasis. No focal consolidation. Calcified nodules at the right base. No pulmonary mass. Musculoskeletal: No chest wall abnormality. No suspicious lytic or blastic osseous lesions. No acute displaced fracture. Review of the MIP images confirms the above findings. CT ABDOMEN and PELVIS FINDINGS Hepatobiliary: No focal liver  abnormality. No gallstones, gallbladder wall thickening, or pericholecystic fluid. No biliary dilatation. Pancreas: No adrenal nodule bilaterally. Bilateral kidneys enhance symmetrically. No hydronephrosis. No hydroureter. The urinary bladder is unremarkable. Spleen: Normal in size without focal abnormality. Adrenals/Urinary Tract: No adrenal nodule bilaterally. Bilateral kidneys enhance symmetrically. Bilateral punctate nephrolithiasis. Up to 3 mm calcified stone on the left. No hydronephrosis. 1.5 cm fluid density exophytic right kidney lesion that likely represents a simple renal cyst. Otherwise no contour-deforming renal mass. No ureterolithiasis or hydroureter. The urinary bladder is unremarkable. On delayed imaging, there is no urothelial wall thickening and there are no filling defects in the opacified portions of the bilateral collecting systems or ureters. Stomach/Bowel: Stomach is within normal limits. No evidence of bowel wall thickening or dilatation. The appendix not definitely identified. Vascular/Lymphatic: No significant vascular findings are present. No enlarged abdominal or pelvic lymph nodes. Reproductive: Prostate is unremarkable. Other: No intraperitoneal free fluid. No intraperitoneal free gas. No organized fluid collection. Musculoskeletal: Bilateral fat containing small inguinal hernias.  No suspicious lytic or blastic osseous lesions. No acute displaced fracture. Multilevel degenerative changes of the spine. Review of the MIP images confirms the above findings. IMPRESSION: 1. No central pulmonary embolus. Markedly limited evaluation distally due to timing of contrast. 2. No acute intrathoracic abnormality in a patient with low lung volumes. 3. Nonobstructive bilateral nephrolithiasis. 4. Bilateral fat containing inguinal hernias with no findings suggest ischemia or associated bowel obstruction. 5. Right lung sequelae of prior granulomatous disease. Electronically Signed   By: Tish FredericksonMorgane  Naveau  M.D.   On: 12/29/2020 18:35   VAS US LOWER EXTREMITY VENOUS (DVT)  Result Date: 12/30/2020  Lower Venous DVT Study Indications: Edema, R>L.  Risk Factors: Patient on BiPap. Limitations: BMI >60 and body habitus. Comparison       Prior Bilateral LE venous duplex done 06/10/19 was Study:           limited/negative Performing Technologist: Sherren Kernsandace Kanady RVS  Examination Guidelines: A complete evaluation includes B-mode imaging, spectral Doppler, color Doppler, and power Doppler as needed of all accessible portions of each vessel. Bilateral testing is considered an integral part of a complete examination. Limited examinations for reoccurring indications may be performed as noted. The reflux portion of the exam is performed with the patient in reverse Trendelenburg.  +---------+---------------+---------+-----------+----------+--------------+ RIGHT    CompressibilityPhasicitySpontaneityPropertiesThrombus Aging +---------+---------------+---------+-----------+----------+--------------+ CFV      Full           Yes      Yes                                 +---------+---------------+---------+-----------+----------+--------------+ SFJ      Full                                                        +---------+---------------+---------+-----------+----------+--------------+ FV Prox  Full                                                        +---------+---------------+---------+-----------+----------+--------------+ FV Mid   Full                                                        +---------+---------------+---------+-----------+----------+--------------+ FV DistalFull                                                        +---------+---------------+---------+-----------+----------+--------------+ PFV      Full                                                        +---------+---------------+---------+-----------+----------+--------------+ POP      Full  Yes       Yes                                 +---------+---------------+---------+-----------+----------+--------------+ PTV      Full                                                        +---------+---------------+---------+-----------+----------+--------------+ PERO     Full                                                        +---------+---------------+---------+-----------+----------+--------------+   Left Technical Findings: Left leg not evaluated.   Summary: RIGHT: - There is no evidence of deep vein thrombosis in the lower extremity.   *See table(s) above for measurements and observations. Electronically signed by Sherald Hess MD on 12/30/2020 at 5:11:48 PM.    Final         Scheduled Meds: . enoxaparin (LOVENOX) injection  110 mg Subcutaneous Daily  . feeding supplement  237 mL Oral BID BM   Continuous Infusions: . piperacillin-tazobactam (ZOSYN)  IV 3.375 g (12/31/20 0926)  . vancomycin 2,000 mg (12/31/20 1500)     LOS: 2 days    Time spent: 35 minutes    Berton Mount, MD  [facility] Pager #: [phone] 7PM-7AM contact night coverage as above

## 2021-01-01 ENCOUNTER — Inpatient Hospital Stay (HOSPITAL_COMMUNITY): Payer: 59

## 2021-01-01 DIAGNOSIS — R1010 Upper abdominal pain, unspecified: Secondary | ICD-10-CM

## 2021-01-01 DIAGNOSIS — I5030 Unspecified diastolic (congestive) heart failure: Secondary | ICD-10-CM

## 2021-01-01 DIAGNOSIS — J410 Simple chronic bronchitis: Secondary | ICD-10-CM

## 2021-01-01 DIAGNOSIS — I5031 Acute diastolic (congestive) heart failure: Secondary | ICD-10-CM | POA: Diagnosis not present

## 2021-01-01 DIAGNOSIS — J9621 Acute and chronic respiratory failure with hypoxia: Secondary | ICD-10-CM | POA: Diagnosis not present

## 2021-01-01 DIAGNOSIS — R0789 Other chest pain: Secondary | ICD-10-CM | POA: Diagnosis not present

## 2021-01-01 LAB — PROCALCITONIN: Procalcitonin: <0.1 ng/mL

## 2021-01-01 LAB — ECHOCARDIOGRAM COMPLETE
Area-P 1/2: 3.53 cm2
Height: 76 in
S' Lateral: 3.7 cm
Weight: 8192 oz

## 2021-01-01 MED ORDER — FUROSEMIDE 80 MG PO TABS
80.0000 mg | ORAL_TABLET | Freq: Every day | ORAL | Status: DC
  Administered 2021-01-01 – 2021-01-02 (×2): 80 mg via ORAL
  Filled 2021-01-01 (×2): qty 1

## 2021-01-01 MED ORDER — AMLODIPINE BESYLATE 5 MG PO TABS
5.0000 mg | ORAL_TABLET | Freq: Every day | ORAL | Status: DC
  Administered 2021-01-01 – 2021-01-02 (×2): 5 mg via ORAL
  Filled 2021-01-01 (×2): qty 1

## 2021-01-01 MED ORDER — LOSARTAN POTASSIUM 50 MG PO TABS
50.0000 mg | ORAL_TABLET | Freq: Every day | ORAL | Status: DC
  Administered 2021-01-01 – 2021-01-02 (×2): 50 mg via ORAL
  Filled 2021-01-01 (×2): qty 1

## 2021-01-01 MED ORDER — PERFLUTREN LIPID MICROSPHERE
1.0000 mL | INTRAVENOUS | Status: AC | PRN
  Administered 2021-01-01: 4 mL via INTRAVENOUS
  Filled 2021-01-01: qty 10

## 2021-01-01 MED ORDER — AMOXICILLIN-POT CLAVULANATE 875-125 MG PO TABS
1.0000 | ORAL_TABLET | Freq: Two times a day (BID) | ORAL | Status: DC
  Administered 2021-01-01 – 2021-01-02 (×2): 1 via ORAL
  Filled 2021-01-01 (×2): qty 1

## 2021-01-01 NOTE — Progress Notes (Signed)
Pt offered BIPAP he states he is not ready due to ball game.  Will have RN call me when ready.

## 2021-01-01 NOTE — Progress Notes (Signed)
[facility]                                                                              Patient Demographics  Jeffrey Skinner, is a 65 y.o. male, DOB - 1956/08/03, OZD:[id number]  Admit date - 12/29/2020   Admitting Physician Frankey Shown, DO  Outpatient Primary MD for the patient is Kallie Locks, FNP  Outpatient specialists:   LOS - 3  days   Medical records reviewed and are as summarized below:    Chief Complaint  Patient presents with  . Shortness of Breath  . Chest Pain       Brief summary   Patient is a 65 year old male with a history of COPD, chronic respiratory failure on supplemental O2, 3 L at baseline, hypertension, chronic diastolic CHF, EF 60 to 65%, OSA presented to ED with shortness of breath and chest pain.  Patient reports baseline shortness of breath that has been ongoing for several months.  He also reported new onset of low bilateral chest pain and upper abdominal chest pain which started a day before the admission and was persistent, 8/10, associated with worsening shortness of breath.  He also endorsed chronic leg swelling. In ED, had a temp of 101 F, tachycardic and tachypneic.   CTA chest negative for PE, abdomen pelvis showed nonobstructive bilateral nephrolithiasis, bilateral fat-containing inguinal hernia, no bowel obstruction.  Patient was placed on IV vancomycin and Zosyn and admitted for further work-up 2D echo results pending  Assessment & Plan    Principal Problem:   Acute on chronic respiratory failure with hypoxia and hypercapnia (HCC), multifactorial likely due to COPD exacerbation and diastolic CHF exacerbation, underlying OHV and OSA, noncompliant with CPAP (broken) -On admission, ABG showed pH of 7.2, PCO2 79.7, PO2 137.  BNP 11.2 however patient had reported bilateral chest pain and worsening shortness of breath in the last several months.  Chest x-ray showed pulmonary vascular congestion. -CTA chest abdomen pelvis  showed no central embolus, low lung volumes, nonobstructive bilateral nephrolithiasis.  Doppler ultrasound RLE negative for DVT. -2D echo pending, resume Lasix 80 mg daily -TOC consulted for CPAP, continue nebs, Pulmicort  Active Problems: Chronic diastolic CHF -Follow 2D echo, resume Lasix 80 mg daily -2D echo in 06/2019 had shown EF of 60 to 65%, BNP unreliable, morbidly obese -Given history of worsening shortness of breath, abdominal bloating, peripheral edema, chest pain, requested cardiology consult.  Patient reports that he follows [facility] and has been having difficulty with appointments    SIRS (systemic inflammatory response syndrome) (HCC) unclear etiology -Patient was placed on IV vancomycin and Zosyn, normal procalcitonin -No systemic signs of infection, CTA chest and abdomen pelvis negative for consolidation, pneumonia or intra-abdominal pathology.  UA negative for UTI -No fevers in the last 24 hours, cultures negative -Zosyn discontinued, placed on Augmentin   Obstructive sleep apnea, OHV -At baseline on 3 L O2 via [place], states has not been using CPAP, broken -TOC consulted for arranging CPAP  Essential hypertension -Antihypertensives were held at the time of admission, resume Lasix 80 mg daily  Morbid obesity Estimated body mass index is 62.32 kg/m  as calculated from the following:   Height as of this encounter: 6\' 4"  (1.93 m).   Weight as of this encounter: 232.2 kg.  Diet and lifestyle changes counseling  Code Status: Full code DVT Prophylaxis:  SCDs Start: 12/29/20 2324   Level of Care: Level of care: Med-Surg Family Communication: Discussed all imaging results, lab results, explained to the patient and his wife on the phone   Disposition Plan:     Status is: Inpatient  Remains inpatient appropriate because:Hemodynamically unstable and Inpatient level of care appropriate due to severity of illness   Dispo: The patient is from: Home               Anticipated d/c is to: Home              Patient currently is not medically stable to d/c.  Awaiting 2D echo   Difficult to place patient No      Time Spent in minutes   35 minutes  Procedures:  None  Consultants:   Cardiology*  Antimicrobials:   Anti-infectives (From admission, onward)   Start     Dose/Rate Route Frequency Ordered Stop   01/01/21 2200  amoxicillin-clavulanate (AUGMENTIN) 875-125 MG per tablet 1 tablet        1 tablet Oral Every 12 hours 01/01/21 1026     12/30/20 0500  vancomycin (VANCOREADY) IVPB 2000 mg/400 mL  Status:  Discontinued       "Followed by" Linked Group Details   2,000 mg 200 mL/hr over 120 Minutes Intravenous Every 12 hours 12/29/20 1644 12/31/20 1635   12/29/20 1700  piperacillin-tazobactam (ZOSYN) IVPB 3.375 g        3.375 g 12.5 mL/hr over 240 Minutes Intravenous Every 8 hours 12/29/20 1644 01/01/21 1219   12/29/20 1700  vancomycin (VANCOCIN) 2,500 mg in sodium chloride 0.9 % 500 mL IVPB       "Followed by" Linked Group Details   2,500 mg 250 mL/hr over 120 Minutes Intravenous  Once 12/29/20 1644 12/29/20 2355         Medications  Scheduled Meds: . amoxicillin-clavulanate  1 tablet Oral Q12H  . budesonide (PULMICORT) nebulizer solution  0.25 mg Nebulization BID  . enoxaparin (LOVENOX) injection  110 mg Subcutaneous Daily  . feeding supplement  237 mL Oral BID BM   Continuous Infusions: PRN Meds:.acetaminophen, albuterol, furosemide, perflutren lipid microspheres (DEFINITY) IV suspension      Subjective:   Jeffrey Skinner was seen and examined today.  Overall feels he is improving, needed CPAP overnight.  Currently no chest pain. Patient denies dizziness, abdominal pain, N/V/D/C, new weakness, numbess, tingling. No acute events overnight.    Objective:   Vitals:   01/01/21 0743 01/01/21 0820 01/01/21 1120 01/01/21 1300  BP:  129/81  (!) 150/74  Pulse:  86  74  Resp:  17  18  Temp:  98.3 F (36.8 C)  97.7 F (36.5 C)   TempSrc:      SpO2: 98% 91% 90% 92%  Weight:      Height:        Intake/Output Summary (Last 24 hours) at 01/01/2021 1417 Last data filed at 01/01/2021 0615 Gross per 24 hour  Intake 1400.24 ml  Output 1100 ml  Net 300.24 ml     Wt Readings from Last 3 Encounters:  01/01/21 (!) 232.2 kg  02/03/20 (!) 226.8 kg  06/23/19 (!) 231.3 kg     Exam  General: Alert and oriented x 3, NAD  Cardiovascular: S1 S2 auscultated, no murmurs, RRR  Respiratory: Decreased breath sounds throughout  Gastrointestinal: Morbidly obese, soft, nontender, + bowel sounds  Ext: chronic pedal edema bilaterally  Neuro: moving all 4 extremities spontaneously  Musculoskeletal: No digital cyanosis, clubbing  Skin: No rashes  Psych: Normal affect and demeanor, alert and oriented x3    Data Reviewed:  I have personally reviewed following labs and imaging studies  Micro Results Recent Results (from the past 240 hour(s))  Culture, blood (routine x 2)     Status: None (Preliminary result)   Collection Time: 12/29/20  2:18 PM   Specimen: BLOOD  Result Value Ref Range Status   Specimen Description BLOOD RIGHT ANTECUBITAL  Final   Special Requests   Final    BOTTLES DRAWN AEROBIC AND ANAEROBIC Blood Culture adequate volume   Culture   Final    NO GROWTH 2 DAYS Performed at [facility], [address], [place], [place] [address]    Report Status PENDING  Incomplete  Culture, blood (routine x 2)     Status: None (Preliminary result)   Collection Time: 12/29/20  2:30 PM   Specimen: BLOOD  Result Value Ref Range Status   Specimen Description BLOOD SITE NOT SPECIFIED  Final   Special Requests BOTTLES DRAWN AEROBIC ONLY  Final   Culture   Final    NO GROWTH 2 DAYS Performed at [facility], [address], [place], [place] [address]    Report Status PENDING  Incomplete  Resp Panel by RT-PCR (Flu A&B, Covid) Nasopharyngeal Swab     Status: None   Collection Time: 12/29/20  3:31 PM    Specimen: Nasopharyngeal Swab; Nasopharyngeal(NP) swabs in vial transport medium  Result Value Ref Range Status   SARS Coronavirus 2 by RT PCR NEGATIVE NEGATIVE Final    Comment: (NOTE) SARS-CoV-2 target nucleic acids are NOT DETECTED.  The SARS-CoV-2 RNA is generally detectable in upper respiratory specimens during the acute phase of infection. The lowest concentration of SARS-CoV-2 viral copies this assay can detect is 138 copies/mL. A negative result does not preclude SARS-Cov-2 infection and should not be used as the sole basis for treatment or other patient management decisions. A negative result may occur with  improper specimen collection/handling, submission of specimen other than nasopharyngeal swab, presence of viral mutation(s) within the areas targeted by this assay, and inadequate number of viral copies(<138 copies/mL). A negative result must be combined with clinical observations, patient history, and epidemiological information. The expected result is Negative.  Fact Sheet for Patients:  BloggerCourse.com  Fact Sheet for Healthcare Providers:  SeriousBroker.it  This test is no t yet approved or cleared by the [place] FDA and  has been authorized for detection and/or diagnosis of SARS-CoV-2 by FDA under an Emergency Use Authorization (EUA). This EUA will remain  in effect (meaning this test can be used) for the duration of the COVID-19 declaration under Section 564(b)(1) of the Act, 21 U.S.C.section 360bbb-3(b)(1), unless the authorization is terminated  or revoked sooner.       Influenza A by PCR NEGATIVE NEGATIVE Final   Influenza B by PCR NEGATIVE NEGATIVE Final    Comment: (NOTE) The Xpert Xpress SARS-CoV-2/FLU/RSV plus assay is intended as an aid in the diagnosis of influenza from Nasopharyngeal swab specimens and should not be used as a sole basis for treatment. Nasal washings and aspirates are  unacceptable for Xpert Xpress SARS-CoV-2/FLU/RSV testing.  Fact Sheet for Patients: BloggerCourse.com  Fact Sheet  for Healthcare Providers: SeriousBroker.ithttps://www.fda.gov/media/152162/download  This test is not yet approved or cleared by the [place] FDA and has been authorized for detection and/or diagnosis of SARS-CoV-2 by FDA under an Emergency Use Authorization (EUA). This EUA will remain in effect (meaning this test can be used) for the duration of the COVID-19 declaration under Section 564(b)(1) of the Act, 21 U.S.C. section 360bbb-3(b)(1), unless the authorization is terminated or revoked.  Performed at [facility], [address] [place], [place] [address]     Radiology Reports CT Angio Chest PE W and/or Wo Contrast  Result Date: 12/29/2020 CLINICAL DATA:  Suspected high probability pulmonary embolus. Fever of unknown origin. Diverticulitis suspected. Cough, shortness of breath, diffuse epigastric pain, nausea, constipation. EXAM: CT ANGIOGRAPHY CHEST CT ABDOMEN AND PELVIS WITH CONTRAST TECHNIQUE: Multidetector CT imaging of the chest was performed using the standard protocol during bolus administration of intravenous contrast. Multiplanar CT image reconstructions and MIPs were obtained to evaluate the vascular anatomy. Multidetector CT imaging of the abdomen and pelvis was performed using the standard protocol during bolus administration of intravenous contrast. CONTRAST:  100mL OMNIPAQUE IOHEXOL 350 MG/ML SOLN COMPARISON:  CT angio chest 03/05/2018 FINDINGS: CTA CHEST FINDINGS Cardiovascular: Fair opacification of the pulmonary arteries to the proximal segmental level. No evidence of pulmonary embolism. Normal heart size. No pericardial effusion. Mediastinum/Nodes: Redemonstration of calcified right inter lobar nodes that are not large enlarged in size. No enlarged mediastinal, hilar, or axillary lymph nodes. Thyroid gland, trachea, and esophagus  demonstrate no significant findings. Lungs/Pleura: Expiratory phase of respiration. Low lung volumes. Linear atelectasis. No focal consolidation. Calcified nodules at the right base. No pulmonary mass. Musculoskeletal: No chest wall abnormality. No suspicious lytic or blastic osseous lesions. No acute displaced fracture. Review of the MIP images confirms the above findings. CT ABDOMEN and PELVIS FINDINGS Hepatobiliary: No focal liver abnormality. No gallstones, gallbladder wall thickening, or pericholecystic fluid. No biliary dilatation. Pancreas: No adrenal nodule bilaterally. Bilateral kidneys enhance symmetrically. No hydronephrosis. No hydroureter. The urinary bladder is unremarkable. Spleen: Normal in size without focal abnormality. Adrenals/Urinary Tract: No adrenal nodule bilaterally. Bilateral kidneys enhance symmetrically. Bilateral punctate nephrolithiasis. Up to 3 mm calcified stone on the left. No hydronephrosis. 1.5 cm fluid density exophytic right kidney lesion that likely represents a simple renal cyst. Otherwise no contour-deforming renal mass. No ureterolithiasis or hydroureter. The urinary bladder is unremarkable. On delayed imaging, there is no urothelial wall thickening and there are no filling defects in the opacified portions of the bilateral collecting systems or ureters. Stomach/Bowel: Stomach is within normal limits. No evidence of bowel wall thickening or dilatation. The appendix not definitely identified. Vascular/Lymphatic: No significant vascular findings are present. No enlarged abdominal or pelvic lymph nodes. Reproductive: Prostate is unremarkable. Other: No intraperitoneal free fluid. No intraperitoneal free gas. No organized fluid collection. Musculoskeletal: Bilateral fat containing small inguinal hernias. No suspicious lytic or blastic osseous lesions. No acute displaced fracture. Multilevel degenerative changes of the spine. Review of the MIP images confirms the above findings.  IMPRESSION: 1. No central pulmonary embolus. Markedly limited evaluation distally due to timing of contrast. 2. No acute intrathoracic abnormality in a patient with low lung volumes. 3. Nonobstructive bilateral nephrolithiasis. 4. Bilateral fat containing inguinal hernias with no findings suggest ischemia or associated bowel obstruction. 5. Right lung sequelae of prior granulomatous disease. Electronically Signed   By: Tish FredericksonMorgane  Naveau M.D.   On: 12/29/2020 18:35   CT ABDOMEN PELVIS W CONTRAST  Result Date: 12/29/2020 CLINICAL DATA:  Suspected high probability pulmonary embolus. Fever of unknown origin. Diverticulitis suspected. Cough, shortness of breath, diffuse epigastric pain, nausea, constipation. EXAM: CT ANGIOGRAPHY CHEST CT ABDOMEN AND PELVIS WITH CONTRAST TECHNIQUE: Multidetector CT imaging of the chest was performed using the standard protocol during bolus administration of intravenous contrast. Multiplanar CT image reconstructions and MIPs were obtained to evaluate the vascular anatomy. Multidetector CT imaging of the abdomen and pelvis was performed using the standard protocol during bolus administration of intravenous contrast. CONTRAST:  OMNIPAQUE IOHEXOL 350 MG/ML SOLN COMPARISON:  CT angio chest 03/05/2018 FINDINGS: CTA CHEST FINDINGS Cardiovascular: Fair opacification of the pulmonary arteries to the proximal segmental level. No evidence of pulmonary embolism. Normal heart size. No pericardial effusion. Mediastinum/Nodes: Redemonstration of calcified right inter lobar nodes that are not large enlarged in size. No enlarged mediastinal, hilar, or axillary lymph nodes. Thyroid gland, trachea, and esophagus demonstrate no significant findings. Lungs/Pleura: Expiratory phase of respiration. Low lung volumes. Linear atelectasis. No focal consolidation. Calcified nodules at the right base. No pulmonary mass. Musculoskeletal: No chest wall abnormality. No suspicious lytic or blastic osseous lesions.  No acute displaced fracture. Review of the MIP images confirms the above findings. CT ABDOMEN and PELVIS FINDINGS Hepatobiliary: No focal liver abnormality. No gallstones, gallbladder wall thickening, or pericholecystic fluid. No biliary dilatation. Pancreas: No adrenal nodule bilaterally. Bilateral kidneys enhance symmetrically. No hydronephrosis. No hydroureter. The urinary bladder is unremarkable. Spleen: Normal in size without focal abnormality. Adrenals/Urinary Tract: No adrenal nodule bilaterally. Bilateral kidneys enhance symmetrically. Bilateral punctate nephrolithiasis. Up to 3 mm calcified stone on the left. No hydronephrosis. 1.5 cm fluid density exophytic right kidney lesion that likely represents a simple renal cyst. Otherwise no contour-deforming renal mass. No ureterolithiasis or hydroureter. The urinary bladder is unremarkable. On delayed imaging, there is no urothelial wall thickening and there are no filling defects in the opacified portions of the bilateral collecting systems or ureters. Stomach/Bowel: Stomach is within normal limits. No evidence of bowel wall thickening or dilatation. The appendix not definitely identified. Vascular/Lymphatic: No significant vascular findings are present. No enlarged abdominal or pelvic lymph nodes. Reproductive: Prostate is unremarkable. Other: No intraperitoneal free fluid. No intraperitoneal free gas. No organized fluid collection. Musculoskeletal: Bilateral fat containing small inguinal hernias. No suspicious lytic or blastic osseous lesions. No acute displaced fracture. Multilevel degenerative changes of the spine. Review of the MIP images confirms the above findings. IMPRESSION: 1. No central pulmonary embolus. Markedly limited evaluation distally due to timing of contrast. 2. No acute intrathoracic abnormality in a patient with low lung volumes. 3. Nonobstructive bilateral nephrolithiasis. 4. Bilateral fat containing inguinal hernias with no findings  suggest ischemia or associated bowel obstruction. 5. Right lung sequelae of prior granulomatous disease. Electronically Signed   By: Tish Frederickson M.D.   On: 12/29/2020 18:35   DG Chest Port 1 View  Result Date: 12/29/2020 CLINICAL DATA:  Shortness of breath, fever. EXAM: PORTABLE CHEST 1 VIEW COMPARISON:  June 11, 2019. FINDINGS: Stable cardiomegaly. No pneumothorax is noted. Mild central pulmonary vascular congestion is noted with probable mild bilateral pulmonary edema. No significant pleural effusion is noted. Bony thorax is unremarkable. IMPRESSION: Stable cardiomegaly with central pulmonary vascular congestion. Mild bilateral pulmonary edema is noted. Electronically Signed   By: Lupita Raider M.D.   On: 12/29/2020 13:41   ECHOCARDIOGRAM COMPLETE  Result Date: 01/01/2021    ECHOCARDIOGRAM REPORT   Patient Name:   Jeffrey Skinner Date of Exam: 01/01/2021 Medical Rec #:  [id number]  Height:       76.0 in Accession #:    [id number]    Weight:       512.0 lb Date of Birth:  10-11-1955     BSA:          3.303 m Patient Age:    65 years      BP:           129/81 mmHg Patient Gender: M             HR:           81 bpm. Exam Location:  Inpatient Procedure: 2D Echo Indications:    acute diastolic chf  History:        Patient has prior history of Echocardiogram examinations, most                 recent 06/10/2019. COPD, Signs/Symptoms:Chest Pain; Risk                 Factors:Sleep Apnea.  Sonographer:    Delcie Roch Referring Phys: [id number] SYLVESTER I OGBATA  Sonographer Comments: Patient is morbidly obese. Image acquisition challenging due to patient body habitus. IMPRESSIONS  1. Left ventricular ejection fraction, by estimation, is 60 to 65%. The left ventricle has normal function. The left ventricle has no regional wall motion abnormalities. There is mild left ventricular hypertrophy. Left ventricular diastolic parameters were normal.  2. Right ventricular systolic function is normal. The right  ventricular size is normal.  3. Left atrial size was mildly dilated.  4. The mitral valve is normal in structure. No evidence of mitral valve regurgitation. No evidence of mitral stenosis.  5. The aortic valve is tricuspid. There is mild calcification of the aortic valve. Aortic valve regurgitation is not visualized. No aortic stenosis is present.  6. The inferior vena cava is normal in size with greater than 50% respiratory variability, suggesting right atrial pressure of 3 mmHg. FINDINGS  Left Ventricle: Left ventricular ejection fraction, by estimation, is 60 to 65%. The left ventricle has normal function. The left ventricle has no regional wall motion abnormalities. Definity contrast agent was given IV to delineate the left ventricular  endocardial borders. The left ventricular internal cavity size was normal in size. There is mild left ventricular hypertrophy. Left ventricular diastolic parameters were normal. Right Ventricle: The right ventricular size is normal. No increase in right ventricular wall thickness. Right ventricular systolic function is normal. Left Atrium: Left atrial size was mildly dilated. Right Atrium: Right atrial size was normal in size. Pericardium: There is no evidence of pericardial effusion. Mitral Valve: The mitral valve is normal in structure. No evidence of mitral valve regurgitation. No evidence of mitral valve stenosis. Tricuspid Valve: The tricuspid valve is normal in structure. Tricuspid valve regurgitation is trivial. No evidence of tricuspid stenosis. Aortic Valve: The aortic valve is tricuspid. There is mild calcification of the aortic valve. Aortic valve regurgitation is not visualized. No aortic stenosis is present. Pulmonic Valve: The pulmonic valve was normal in structure. Pulmonic valve regurgitation is not visualized. No evidence of pulmonic stenosis. Aorta: The aortic root is normal in size and structure. Venous: The inferior vena cava is normal in size with greater  than 50% respiratory variability, suggesting right atrial pressure of 3 mmHg. IAS/Shunts: The interatrial septum was not well visualized.  LEFT VENTRICLE PLAX 2D LVIDd:         5.30 cm Diastology LVIDs:         3.70 cm LV e'  medial:    10.20 cm/s LV PW:         1.20 cm LV E/e' medial:  7.3 LV IVS:        1.30 cm LV e' lateral:   13.60 cm/s                        LV E/e' lateral: 5.4  RIGHT VENTRICLE RV S prime:     14.50 cm/s TAPSE (M-mode): 2.2 cm LEFT ATRIUM             Index       RIGHT ATRIUM           Index LA diam:        4.30 cm 1.30 cm/m  RA Area:     19.00 cm LA Vol (A2C):   62.3 ml 18.86 ml/m RA Volume:   54.90 ml  16.62 ml/m LA Vol (A4C):   53.9 ml 16.32 ml/m LA Biplane Vol: 62.0 ml 18.77 ml/m  AORTIC VALVE LVOT Vmax:   108.00 cm/s LVOT Vmean:  74.400 cm/s LVOT VTI:    0.201 m  AORTA Ao Asc diam: 3.70 cm MITRAL VALVE MV Area (PHT): 3.53 cm    SHUNTS MV Decel Time: 215 msec    Systemic VTI: 0.20 m MV E velocity: 74.10 cm/s MV A velocity: 65.60 cm/s MV E/A ratio:  1.13 Charlton Haws MD Electronically signed by Charlton Haws MD Signature Date/Time: 01/01/2021/2:06:58 PM    Final    VAS [place] LOWER EXTREMITY VENOUS (DVT)  Result Date: 12/30/2020  Lower Venous DVT Study Indications: Edema, R>L.  Risk Factors: Patient on BiPap. Limitations: BMI >60 and body habitus. Comparison       Prior Bilateral LE venous duplex done 06/10/19 was Study:           limited/negative Performing Technologist: Sherren Kerns RVS  Examination Guidelines: A complete evaluation includes B-mode imaging, spectral Doppler, color Doppler, and power Doppler as needed of all accessible portions of each vessel. Bilateral testing is considered an integral part of a complete examination. Limited examinations for reoccurring indications may be performed as noted. The reflux portion of the exam is performed with the patient in reverse Trendelenburg.  +---------+---------------+---------+-----------+----------+--------------+ RIGHT     CompressibilityPhasicitySpontaneityPropertiesThrombus Aging +---------+---------------+---------+-----------+----------+--------------+ CFV      Full           Yes      Yes                                 +---------+---------------+---------+-----------+----------+--------------+ SFJ      Full                                                        +---------+---------------+---------+-----------+----------+--------------+ FV Prox  Full                                                        +---------+---------------+---------+-----------+----------+--------------+ FV Mid   Full                                                        +---------+---------------+---------+-----------+----------+--------------+  FV DistalFull                                                        +---------+---------------+---------+-----------+----------+--------------+ PFV      Full                                                        +---------+---------------+---------+-----------+----------+--------------+ POP      Full           Yes      Yes                                 +---------+---------------+---------+-----------+----------+--------------+ PTV      Full                                                        +---------+---------------+---------+-----------+----------+--------------+ PERO     Full                                                        +---------+---------------+---------+-----------+----------+--------------+   Left Technical Findings: Left leg not evaluated.   Summary: RIGHT: - There is no evidence of deep vein thrombosis in the lower extremity.   *See table(s) above for measurements and observations. Electronically signed by Sherald Hess MD on 12/30/2020 at 5:11:48 PM.    Final     Lab Data:  CBC: Recent Labs  Lab 12/29/20 1304 12/30/20 0013 12/30/20 0419 12/31/20 0334  WBC 6.2 6.7 6.5 8.3  NEUTROABS  --   --   --  5.0   HGB 14.6 13.1 12.6* 11.9*  HCT 48.9 44.1 43.0 40.7  MCV 97.0 96.3 97.1 97.6  PLT 182 146* 156 153   Basic Metabolic Panel: Recent Labs  Lab 12/29/20 1304 12/30/20 0013 12/30/20 0419 12/31/20 0334  NA 135  --  134* 139  K 3.7  --  3.8 4.5  CL 91*  --  94* 98  CO2 34*  --  35* 36*  GLUCOSE 112*  --  119* 117*  BUN <5*  --  11 10  CREATININE 0.94 0.98 1.13 1.05  CALCIUM 8.4*  --  7.6* 8.0*  MG  --   --  1.9 2.0  PHOS  --   --  4.5 3.6   GFR: Estimated Creatinine Clearance: 147.7 mL/min (by C-G formula based on SCr of 1.05 mg/dL). Liver Function Tests: Recent Labs  Lab 12/29/20 1304 12/30/20 0419 12/31/20 0334  AST 30 27 23   ALT 26 28 23   ALKPHOS 78 66 59  BILITOT 1.0 0.8 0.8  PROT 8.6* 7.1 6.9  ALBUMIN 3.4* 2.7* 2.6*   No results for input(s): LIPASE, AMYLASE in the last 168 hours. No results for input(s): AMMONIA in the last 168 hours. Coagulation Profile:  Recent Labs  Lab 12/30/20 0419  INR 1.1   Cardiac Enzymes: No results for input(s): CKTOTAL, CKMB, CKMBINDEX, TROPONINI in the last 168 hours. BNP (last 3 results) No results for input(s): PROBNP in the last 8760 hours. HbA1C: No results for input(s): HGBA1C in the last 72 hours. CBG: No results for input(s): GLUCAP in the last 168 hours. Lipid Profile: No results for input(s): CHOL, HDL, LDLCALC, TRIG, CHOLHDL, LDLDIRECT in the last 72 hours. Thyroid Function Tests: No results for input(s): TSH, T4TOTAL, FREET4, T3FREE, THYROIDAB in the last 72 hours. Anemia Panel: No results for input(s): VITAMINB12, FOLATE, FERRITIN, TIBC, IRON, RETICCTPCT in the last 72 hours. Urine analysis:    Component Value Date/Time   COLORURINE YELLOW 12/31/2020 1818   APPEARANCEUR CLEAR 12/31/2020 1818   LABSPEC 1.034 (H) 12/31/2020 1818   PHURINE 5.0 12/31/2020 1818   GLUCOSEU NEGATIVE 12/31/2020 1818   HGBUR NEGATIVE 12/31/2020 1818   BILIRUBINUR NEGATIVE 12/31/2020 1818   KETONESUR NEGATIVE 12/31/2020 1818    PROTEINUR NEGATIVE 12/31/2020 1818   NITRITE NEGATIVE 12/31/2020 1818   LEUKOCYTESUR NEGATIVE 12/31/2020 1818     Rashada Klontz M.D. [facility] 01/01/2021, 2:17 PM  Available via Epic secure chat 7am-7pm After 7 pm, please refer to night coverage provider listed on amion.

## 2021-01-01 NOTE — Consult Note (Signed)
Cardiology Consultation:   Patient ID: Jeffrey Skinner MRN: [id number]; DOB: 1955-12-15  Admit date: 12/29/2020 Date of Consult: 01/01/2021  PCP:  Kallie LocksStroud, Natalie M, FNP   [facility]  Cardiologist:      Alinah Sheard    Patient Profile:   Jeffrey Skinner is a 65 y.o. male with a hx of COPD, HTN,  who is being seen today for the evaluation of  Acute CHF  at the request of  Dr. Isidoro Donningai.  History of Present Illness:   Jeffrey Skinner is a 65 year old gentleman with a history of CO PD, hypertension, morbid obesity.  He was admitted with acute respiratory distress, temperature of 101. CTA of the chest was negative for pulmonary embolus.  CT of the abdomen revealed bilateral nonobstructive kidney stones.  The patient has obstructive sleep apnea.  He is noncompliant with his CPAP.  He has obese obesity hypoventilation.  Echocardiogram reveals normal systolic and normal diastolic function.  Body mass index is 62.32 kg/m.  He denies any episodes of chest pain.  He avoids salt for the most part.  He still working.  He owns a food truck company.  He does not get any exercise.  It is difficult for him to walk.     Past Medical History:  Diagnosis Date  . Acute diastolic CHF (congestive heart failure) (HCC) 06/14/2019  . Anxiety 12/16/2019  . Anxiety 11/2019  . Arthritis   . COPD (chronic obstructive pulmonary disease) (HCC) 12/30/2020  . Depression   . Dyspnea   . Hypertension   . Insomnia 11/2029  . Morbid obesity with BMI of 60.0-69.9, adult (HCC)   . Sleep apnea   . Vitamin D deficiency 06/2019    Past Surgical History:  Procedure Laterality Date  . APPENDECTOMY    . CHOLECYSTECTOMY    . HERNIA REPAIR    . VASECTOMY  1998     Home Medications:  Prior to Admission medications   Medication Sig Start Date End Date Taking? Authorizing Provider  albuterol (VENTOLIN HFA) 108 (90 Base) MCG/ACT inhaler Inhale 2 puffs into the lungs every 6 (six) hours as needed for wheezing  or shortness of breath.   Yes [provider]  amLODipine (NORVASC) 5 MG tablet Take 5 mg by mouth daily. 12/14/17  Yes [provider]  furosemide (LASIX) 80 MG tablet Take 80 mg by mouth daily. 12/14/17  Yes [provider]  ipratropium-albuterol (DUONEB) 0.5-2.5 (3) MG/3ML SOLN Take 3 mLs by nebulization every 6 (six) hours as needed. Patient taking differently: Take 3 mLs by nebulization every 6 (six) hours as needed (shortness of breath). 06/24/19  Yes Kallie LocksStroud, Natalie M, FNP  lisinopril (ZESTRIL) 2.5 MG tablet TAKE 1 TABLET BY MOUTH EVERY DAY 02/07/20  Yes Kallie LocksStroud, Natalie M, FNP  Multiple Vitamins-Minerals (MULTIVITAMIN ADULTS PO) Take 1 tablet by mouth daily.   Yes [provider]  naproxen (NAPROSYN) 500 MG tablet Take 500 mg by mouth in the morning and at bedtime. Pain & inflammation 02/16/20  Yes [provider]  Respiratory Therapy Supplies (NEBULIZER) DEVI by Does not apply route.    [provider]  Vitamin D, Ergocalciferol, (DRISDOL) 1.25 MG (50000 UNIT) CAPS capsule TAKE 1 CAPSULE (50,000 UNITS TOTAL) BY MOUTH EVERY 7 (SEVEN) DAYS. Patient not taking: No sig reported 11/08/19   Kallie LocksStroud, Natalie M, FNP    Inpatient Medications: Scheduled Meds: . amoxicillin-clavulanate  1 tablet Oral Q12H  . budesonide (PULMICORT) nebulizer solution  0.25 mg Nebulization  BID  . enoxaparin (LOVENOX) injection  110 mg Subcutaneous Daily  . feeding supplement  237 mL Oral BID BM  . furosemide  80 mg Oral Daily   Continuous Infusions:  PRN Meds: acetaminophen, albuterol, furosemide  Allergies:    Allergies  Allergen Reactions  . Dog Epithelium     Social History:   Social History   Socioeconomic History  . Marital status: Married    Spouse name: Erie Noe  . Number of children: 2  . Years of education: Not on file  . Highest education level: Not on file  Occupational History  . Not on file  Tobacco Use  . Smoking status: Never Smoker   . Smokeless tobacco: Never Used  Vaping Use  . Vaping Use: Never used  Substance and Sexual Activity  . Alcohol use: Yes    Alcohol/week: 1.0 standard drink    Types: 1 Shots of liquor per week    Comment: - once a month  . Drug use: No  . Sexual activity: Not Currently  Other Topics Concern  . Not on file  Social History Narrative  . Not on file   Social Determinants of Health   Financial Resource Strain: Not on file  Food Insecurity: Not on file  Transportation Needs: Not on file  Physical Activity: Not on file  Stress: Not on file  Social Connections: Not on file  Intimate Partner Violence: Not on file    Family History:    Family History  Problem Relation Age of Onset  . Rheum arthritis Mother   . Arthritis Sister   . Cancer - Lung Brother   . Heart disease Brother      ROS:  Please see the history of present illness.   All other ROS reviewed and negative.     Physical Exam/Data:   Vitals:   01/01/21 0743 01/01/21 0820 01/01/21 1120 01/01/21 1300  BP:  129/81  (!) 150/74  Pulse:  86  74  Resp:  17  18  Temp:  98.3 F (36.8 C)  97.7 F (36.5 C)  TempSrc:      SpO2: 98% 91% 90% 92%  Weight:      Height:        Intake/Output Summary (Last 24 hours) at 01/01/2021 1624 Last data filed at 01/01/2021 1300 Gross per 24 hour  Intake 1400.24 ml  Output 1450 ml  Net -49.76 ml   Last 3 Weights 01/01/2021 12/31/2020 12/30/2020  Weight (lbs) 512 lb 511 lb 508 lb  Weight (kg) 232.242 kg 231.788 kg 230.427 kg     Body mass index is 62.32 kg/m.  General:   morbidlyobese male, NAD  HEENT: normal Lymph: no adenopathy Neck: no JVD Endocrine:  No thryomegaly Vascular: No carotid bruits; FA pulses 2+ bilaterally without bruits  Cardiac:  normal S1, S2; RRR; no murmur , distant heart sounds  Lungs:  clear to auscultation bilaterally, no wheezing, rhonchi or rales  Abd: soft, nontender, no hepatomegaly  Ext: no edema Musculoskeletal:  No deformities, BUE and BLE  strength normal and equal Skin: warm and dry  Neuro:  CNs 2-12 intact, no focal abnormalities noted Psych:  Normal affect   EKG:  The EKG was personally reviewed and demonstrates:   Sinus tachycardia 110 beats a minute.  No ST or T wave changes.  Telemetry:  Telemetry was personally reviewed and demonstrates: Sinus rhythm  Relevant CV Studies:   Laboratory Data:  High Sensitivity Troponin:   Recent Labs  Lab  12/29/20 1304 12/30/20 0013  TROPONINIHS 5 4     Chemistry Recent Labs  Lab 12/29/20 1304 12/30/20 0013 12/30/20 0419 12/31/20 0334  NA 135  --  134* 139  K 3.7  --  3.8 4.5  CL 91*  --  94* 98  CO2 34*  --  35* 36*  GLUCOSE 112*  --  119* 117*  BUN <5*  --  11 10  CREATININE 0.94 0.98 1.13 1.05  CALCIUM 8.4*  --  7.6* 8.0*  GFRNONAA >60 >60 >60 >60  ANIONGAP 10  --  5 5    Recent Labs  Lab 12/29/20 1304 12/30/20 0419 12/31/20 0334  PROT 8.6* 7.1 6.9  ALBUMIN 3.4* 2.7* 2.6*  AST 30 27 23   ALT 26 28 23   ALKPHOS 78 66 59  BILITOT 1.0 0.8 0.8   Hematology Recent Labs  Lab 12/30/20 0013 12/30/20 0419 12/31/20 0334  WBC 6.7 6.5 8.3  RBC 4.58 4.43 4.17*  HGB 13.1 12.6* 11.9*  HCT 44.1 43.0 40.7  MCV 96.3 97.1 97.6  MCH 28.6 28.4 28.5  MCHC 29.7* 29.3* 29.2*  RDW 14.0 14.1 13.7  PLT 146* 156 153   BNP Recent Labs  Lab 12/29/20 1308  BNP 11.2    DDimer No results for input(s): DDIMER in the last 168 hours.   Radiology/Studies:  CT Angio Chest PE W and/or Wo Contrast  Result Date: 12/29/2020 CLINICAL DATA:  Suspected high probability pulmonary embolus. Fever of unknown origin. Diverticulitis suspected. Cough, shortness of breath, diffuse epigastric pain, nausea, constipation. EXAM: CT ANGIOGRAPHY CHEST CT ABDOMEN AND PELVIS WITH CONTRAST TECHNIQUE: Multidetector CT imaging of the chest was performed using the standard protocol during bolus administration of intravenous contrast. Multiplanar CT image reconstructions and MIPs were obtained to  evaluate the vascular anatomy. Multidetector CT imaging of the abdomen and pelvis was performed using the standard protocol during bolus administration of intravenous contrast. CONTRAST:  02/28/21 OMNIPAQUE IOHEXOL 350 MG/ML SOLN COMPARISON:  CT angio chest 03/05/2018 FINDINGS: CTA CHEST FINDINGS Cardiovascular: Fair opacification of the pulmonary arteries to the proximal segmental level. No evidence of pulmonary embolism. Normal heart size. No pericardial effusion. Mediastinum/Nodes: Redemonstration of calcified right inter lobar nodes that are not large enlarged in size. No enlarged mediastinal, hilar, or axillary lymph nodes. Thyroid gland, trachea, and esophagus demonstrate no significant findings. Lungs/Pleura: Expiratory phase of respiration. Low lung volumes. Linear atelectasis. No focal consolidation. Calcified nodules at the right base. No pulmonary mass. Musculoskeletal: No chest wall abnormality. No suspicious lytic or blastic osseous lesions. No acute displaced fracture. Review of the MIP images confirms the above findings. CT ABDOMEN and PELVIS FINDINGS Hepatobiliary: No focal liver abnormality. No gallstones, gallbladder wall thickening, or pericholecystic fluid. No biliary dilatation. Pancreas: No adrenal nodule bilaterally. Bilateral kidneys enhance symmetrically. No hydronephrosis. No hydroureter. The urinary bladder is unremarkable. Spleen: Normal in size without focal abnormality. Adrenals/Urinary Tract: No adrenal nodule bilaterally. Bilateral kidneys enhance symmetrically. Bilateral punctate nephrolithiasis. Up to 3 mm calcified stone on the left. No hydronephrosis. 1.5 cm fluid density exophytic right kidney lesion that likely represents a simple renal cyst. Otherwise no contour-deforming renal mass. No ureterolithiasis or hydroureter. The urinary bladder is unremarkable. On delayed imaging, there is no urothelial wall thickening and there are no filling defects in the opacified portions of the  bilateral collecting systems or ureters. Stomach/Bowel: Stomach is within normal limits. No evidence of bowel wall thickening or dilatation. The appendix not definitely identified. Vascular/Lymphatic: No significant vascular findings  are present. No enlarged abdominal or pelvic lymph nodes. Reproductive: Prostate is unremarkable. Other: No intraperitoneal free fluid. No intraperitoneal free gas. No organized fluid collection. Musculoskeletal: Bilateral fat containing small inguinal hernias. No suspicious lytic or blastic osseous lesions. No acute displaced fracture. Multilevel degenerative changes of the spine. Review of the MIP images confirms the above findings. IMPRESSION: 1. No central pulmonary embolus. Markedly limited evaluation distally due to timing of contrast. 2. No acute intrathoracic abnormality in a patient with low lung volumes. 3. Nonobstructive bilateral nephrolithiasis. 4. Bilateral fat containing inguinal hernias with no findings suggest ischemia or associated bowel obstruction. 5. Right lung sequelae of prior granulomatous disease. Electronically Signed   By: Tish Frederickson M.D.   On: 12/29/2020 18:35   CT ABDOMEN PELVIS W CONTRAST  Result Date: 12/29/2020 CLINICAL DATA:  Suspected high probability pulmonary embolus. Fever of unknown origin. Diverticulitis suspected. Cough, shortness of breath, diffuse epigastric pain, nausea, constipation. EXAM: CT ANGIOGRAPHY CHEST CT ABDOMEN AND PELVIS WITH CONTRAST TECHNIQUE: Multidetector CT imaging of the chest was performed using the standard protocol during bolus administration of intravenous contrast. Multiplanar CT image reconstructions and MIPs were obtained to evaluate the vascular anatomy. Multidetector CT imaging of the abdomen and pelvis was performed using the standard protocol during bolus administration of intravenous contrast. CONTRAST:  OMNIPAQUE IOHEXOL 350 MG/ML SOLN COMPARISON:  CT angio chest 03/05/2018 FINDINGS: CTA CHEST  FINDINGS Cardiovascular: Fair opacification of the pulmonary arteries to the proximal segmental level. No evidence of pulmonary embolism. Normal heart size. No pericardial effusion. Mediastinum/Nodes: Redemonstration of calcified right inter lobar nodes that are not large enlarged in size. No enlarged mediastinal, hilar, or axillary lymph nodes. Thyroid gland, trachea, and esophagus demonstrate no significant findings. Lungs/Pleura: Expiratory phase of respiration. Low lung volumes. Linear atelectasis. No focal consolidation. Calcified nodules at the right base. No pulmonary mass. Musculoskeletal: No chest wall abnormality. No suspicious lytic or blastic osseous lesions. No acute displaced fracture. Review of the MIP images confirms the above findings. CT ABDOMEN and PELVIS FINDINGS Hepatobiliary: No focal liver abnormality. No gallstones, gallbladder wall thickening, or pericholecystic fluid. No biliary dilatation. Pancreas: No adrenal nodule bilaterally. Bilateral kidneys enhance symmetrically. No hydronephrosis. No hydroureter. The urinary bladder is unremarkable. Spleen: Normal in size without focal abnormality. Adrenals/Urinary Tract: No adrenal nodule bilaterally. Bilateral kidneys enhance symmetrically. Bilateral punctate nephrolithiasis. Up to 3 mm calcified stone on the left. No hydronephrosis. 1.5 cm fluid density exophytic right kidney lesion that likely represents a simple renal cyst. Otherwise no contour-deforming renal mass. No ureterolithiasis or hydroureter. The urinary bladder is unremarkable. On delayed imaging, there is no urothelial wall thickening and there are no filling defects in the opacified portions of the bilateral collecting systems or ureters. Stomach/Bowel: Stomach is within normal limits. No evidence of bowel wall thickening or dilatation. The appendix not definitely identified. Vascular/Lymphatic: No significant vascular findings are present. No enlarged abdominal or pelvic lymph  nodes. Reproductive: Prostate is unremarkable. Other: No intraperitoneal free fluid. No intraperitoneal free gas. No organized fluid collection. Musculoskeletal: Bilateral fat containing small inguinal hernias. No suspicious lytic or blastic osseous lesions. No acute displaced fracture. Multilevel degenerative changes of the spine. Review of the MIP images confirms the above findings. IMPRESSION: 1. No central pulmonary embolus. Markedly limited evaluation distally due to timing of contrast. 2. No acute intrathoracic abnormality in a patient with low lung volumes. 3. Nonobstructive bilateral nephrolithiasis. 4. Bilateral fat containing inguinal hernias with no findings suggest ischemia or associated bowel obstruction. 5.  Right lung sequelae of prior granulomatous disease. Electronically Signed   By: Tish Frederickson M.D.   On: 12/29/2020 18:35   DG Chest Port 1 View  Result Date: 12/29/2020 CLINICAL DATA:  Shortness of breath, fever. EXAM: PORTABLE CHEST 1 VIEW COMPARISON:  June 11, 2019. FINDINGS: Stable cardiomegaly. No pneumothorax is noted. Mild central pulmonary vascular congestion is noted with probable mild bilateral pulmonary edema. No significant pleural effusion is noted. Bony thorax is unremarkable. IMPRESSION: Stable cardiomegaly with central pulmonary vascular congestion. Mild bilateral pulmonary edema is noted. Electronically Signed   By: Lupita Raider M.D.   On: 12/29/2020 13:41   ECHOCARDIOGRAM COMPLETE  Result Date: 01/01/2021    ECHOCARDIOGRAM REPORT   Patient Name:   Jeffrey Skinner Date of Exam: 01/01/2021 Medical Rec #:  [id number]     Height:       76.0 in Accession #:    [id number]    Weight:       512.0 lb Date of Birth:  1956/03/10     BSA:          3.303 m Patient Age:    65 years      BP:           129/81 mmHg Patient Gender: M             HR:           81 bpm. Exam Location:  Inpatient Procedure: 2D Echo Indications:    acute diastolic chf  History:        Patient has prior history  of Echocardiogram examinations, most                 recent 06/10/2019. COPD, Signs/Symptoms:Chest Pain; Risk                 Factors:Sleep Apnea.  Sonographer:    Delcie Roch Referring Phys: [id number] SYLVESTER I OGBATA  Sonographer Comments: Patient is morbidly obese. Image acquisition challenging due to patient body habitus. IMPRESSIONS  1. Left ventricular ejection fraction, by estimation, is 60 to 65%. The left ventricle has normal function. The left ventricle has no regional wall motion abnormalities. There is mild left ventricular hypertrophy. Left ventricular diastolic parameters were normal.  2. Right ventricular systolic function is normal. The right ventricular size is normal.  3. Left atrial size was mildly dilated.  4. The mitral valve is normal in structure. No evidence of mitral valve regurgitation. No evidence of mitral stenosis.  5. The aortic valve is tricuspid. There is mild calcification of the aortic valve. Aortic valve regurgitation is not visualized. No aortic stenosis is present.  6. The inferior vena cava is normal in size with greater than 50% respiratory variability, suggesting right atrial pressure of 3 mmHg. FINDINGS  Left Ventricle: Left ventricular ejection fraction, by estimation, is 60 to 65%. The left ventricle has normal function. The left ventricle has no regional wall motion abnormalities. Definity contrast agent was given IV to delineate the left ventricular  endocardial borders. The left ventricular internal cavity size was normal in size. There is mild left ventricular hypertrophy. Left ventricular diastolic parameters were normal. Right Ventricle: The right ventricular size is normal. No increase in right ventricular wall thickness. Right ventricular systolic function is normal. Left Atrium: Left atrial size was mildly dilated. Right Atrium: Right atrial size was normal in size. Pericardium: There is no evidence of pericardial effusion. Mitral Valve: The mitral valve is  normal in structure. No evidence of mitral  valve regurgitation. No evidence of mitral valve stenosis. Tricuspid Valve: The tricuspid valve is normal in structure. Tricuspid valve regurgitation is trivial. No evidence of tricuspid stenosis. Aortic Valve: The aortic valve is tricuspid. There is mild calcification of the aortic valve. Aortic valve regurgitation is not visualized. No aortic stenosis is present. Pulmonic Valve: The pulmonic valve was normal in structure. Pulmonic valve regurgitation is not visualized. No evidence of pulmonic stenosis. Aorta: The aortic root is normal in size and structure. Venous: The inferior vena cava is normal in size with greater than 50% respiratory variability, suggesting right atrial pressure of 3 mmHg. IAS/Shunts: The interatrial septum was not well visualized.  LEFT VENTRICLE PLAX 2D LVIDd:         5.30 cm Diastology LVIDs:         3.70 cm LV e' medial:    10.20 cm/s LV PW:         1.20 cm LV E/e' medial:  7.3 LV IVS:        1.30 cm LV e' lateral:   13.60 cm/s                        LV E/e' lateral: 5.4  RIGHT VENTRICLE RV S prime:     14.50 cm/s TAPSE (M-mode): 2.2 cm LEFT ATRIUM             Index       RIGHT ATRIUM           Index LA diam:        4.30 cm 1.30 cm/m  RA Area:     19.00 cm LA Vol (A2C):   62.3 ml 18.86 ml/m RA Volume:   54.90 ml  16.62 ml/m LA Vol (A4C):   53.9 ml 16.32 ml/m LA Biplane Vol: 62.0 ml 18.77 ml/m  AORTIC VALVE LVOT Vmax:   108.00 cm/s LVOT Vmean:  74.400 cm/s LVOT VTI:    0.201 m  AORTA Ao Asc diam: 3.70 cm MITRAL VALVE MV Area (PHT): 3.53 cm    SHUNTS MV Decel Time: 215 msec    Systemic VTI: 0.20 m MV E velocity: 74.10 cm/s MV A velocity: 65.60 cm/s MV E/A ratio:  1.13 Charlton Haws MD Electronically signed by Charlton Haws MD Signature Date/Time: 01/01/2021/2:06:58 PM    Final    VAS [place] LOWER EXTREMITY VENOUS (DVT)  Result Date: 12/30/2020  Lower Venous DVT Study Indications: Edema, R>L.  Risk Factors: Patient on BiPap. Limitations: BMI >60  and body habitus. Comparison       Prior Bilateral LE venous duplex done 06/10/19 was Study:           limited/negative Performing Technologist: Sherren Kerns RVS  Examination Guidelines: A complete evaluation includes B-mode imaging, spectral Doppler, color Doppler, and power Doppler as needed of all accessible portions of each vessel. Bilateral testing is considered an integral part of a complete examination. Limited examinations for reoccurring indications may be performed as noted. The reflux portion of the exam is performed with the patient in reverse Trendelenburg.  +---------+---------------+---------+-----------+----------+--------------+ RIGHT    CompressibilityPhasicitySpontaneityPropertiesThrombus Aging +---------+---------------+---------+-----------+----------+--------------+ CFV      Full           Yes      Yes                                 +---------+---------------+---------+-----------+----------+--------------+ SFJ      Full                                                        +---------+---------------+---------+-----------+----------+--------------+  FV Prox  Full                                                        +---------+---------------+---------+-----------+----------+--------------+ FV Mid   Full                                                        +---------+---------------+---------+-----------+----------+--------------+ FV DistalFull                                                        +---------+---------------+---------+-----------+----------+--------------+ PFV      Full                                                        +---------+---------------+---------+-----------+----------+--------------+ POP      Full           Yes      Yes                                 +---------+---------------+---------+-----------+----------+--------------+ PTV      Full                                                         +---------+---------------+---------+-----------+----------+--------------+ PERO     Full                                                        +---------+---------------+---------+-----------+----------+--------------+   Left Technical Findings: Left leg not evaluated.   Summary: RIGHT: - There is no evidence of deep vein thrombosis in the lower extremity.   *See table(s) above for measurements and observations. Electronically signed by Sherald Hess MD on 12/30/2020 at 5:11:48 PM.    Final      Assessment and Plan:   1. Respiratory failure: The patient presents with respiratory failure.  I suspect this is due to a COPD exacerbation along with obesity hypoventilation.  Echocardiogram shows normal systolic and diastolic heart function.  Valvular function is within normal limits. He might get a bit of extra salt when his wife cooks but overall he tries to avoid salt.   He is mildly volume overloaded.  He has obstructive sleep apnea but has not been using his CPAP/BiPAP machine.  I would continue with Lasix as you are doing.  Continue with aggressive blood pressure control.  Continue antibiotics for possible COPD exacerbation.  2.  Hypertension: Blood pressure still mildly elevated. He was on  lisinopril and amlodipine at home.  I have restarted his home dose of amlodipine.  I will give him losartan 50 mg a day instead of the lisinopril.  3.  Morbid obesity:  He realizes that his main problem is morbid obesity and knows that he needs to lose weight      Risk Assessment/Risk Scores:     For questions or updates, please contact [facility] Please consult [email or web address] for contact info under    Signed, Kristeen Miss, MD  01/01/2021 4:24 PM

## 2021-01-01 NOTE — Progress Notes (Signed)
  Echocardiogram 2D Echocardiogram has been performed.  Delcie Roch 01/01/2021, 1:53 PM

## 2021-01-01 NOTE — Evaluation (Signed)
Physical Therapy Evaluation Patient Details Name: Jeffrey Skinner MRN: [id number] DOB: [birth year] Today's Date: 01/01/2021   History of Present Illness  Patient is a 65 y/o male who presents with SOB and chest pain 12/30/20. Admitted with acute on chronic respiratory failure with hypoxia secondary to SIRS in setting of abdominal pain and atypical chest pain. PMH includes morbid obesity, HTB, OSA, chronic respiratory failure on 3L 02 [place], COPD, depression.  Clinical Impression  Patient presents with generalized weakness, impaired sensation, dyspnea on exertion, impaired balance, decreased activity tolerance and impaired mobility s/p above. Pt lives at home with wife and reports having difficulty doing ADLs at baseline (but manages) and only walks short distances, no more than 30-40' at a time due to dizziness. Does not use any DME for ambulation. Reports 2 falls in last 6 months. Today, pt requires Min guard-Min A for gait training holding onto IV pole for support with 1 almost/partial LOB posteriorly. Sp02 remained >95% on 3L/min 02 [place]  with activity. Encouraged increasing activity and mobility while in the hospital to improve overall endurance and strength. Will follow acutely to maximize independence and mobility prior to return home.    Follow Up Recommendations No PT follow up;Supervision - Intermittent    Equipment Recommendations  Rolling walker with 5" wheels (wide bariatric RW)    Recommendations for Other Services       Precautions / Restrictions Precautions Precautions: Fall Precaution Comments: hx of falls Restrictions Weight Bearing Restrictions: No      Mobility  Bed Mobility               General bed mobility comments: Sitting on BSC upon PT arrival.    Transfers Overall transfer level: Needs assistance Equipment used: Rolling walker (2 wheeled) Transfers: Sit to/from Stand Sit to Stand: Min guard         General transfer comment: Min guard for safety. Stood  from [facility] x1 with increased time, no assist needed. Transferred to chair post ambulation.  Ambulation/Gait Ambulation/Gait assistance: Min guard;Min assist Gait Distance (Feet): 30 Feet Assistive device: IV Pole Gait Pattern/deviations: Step-through pattern;Decreased stride length;Wide base of support Gait velocity: decreased   General Gait Details: Slow, waddling like gait holding onto IV pole for support at times; initially balance deficits noted in standing when trying to move IV pole around pt, with posterior lean needing Min A to correct, otherwise Min guard assist for safety. 2/4 DOE. Sp02 remained >955 on 3L/min 02 [place].  Stairs            Wheelchair Mobility    Modified Rankin (Stroke Patients Only)       Balance Overall balance assessment: History of Falls;Needs assistance Sitting-balance support: Feet supported;No upper extremity supported Sitting balance-Leahy Scale: Good Sitting balance - Comments: supervision for safety.   Standing balance support: During functional activity Standing balance-Leahy Scale: Fair Standing balance comment: Able to stand statically and walk short distances but likely does better with UE support with balance challenged.                             Pertinent Vitals/Pain Pain Assessment: No/denies pain    Home Living Family/patient expects to be discharged to:: Private residence Living Arrangements: Spouse/significant other;Children Available Help at Discharge: Family;Available PRN/intermittently Type of Home: House Home Access: Ramped entrance     Home Layout: Two level;Laundry or work area in [employer]: None Additional Comments: rigged up something to sit in  in shower.    Prior Function Level of Independence: Needs assistance   Gait / Transfers Assistance Needed: Independent, mininal ambulator, max 30-40' at a time. Walks to/from truck.  Reports 2 falls in last 6 months. Wears 3L 02 at home. Drives.  Reports feeling dizzy a lot.  ADL's / Homemaking Assistance Needed: Independent but does not wear shoes/socks, wears flip flops.  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     Hand Dominance   Dominant Hand: Right    Extremity/Trunk Assessment   Upper Extremity Assessment Upper Extremity Assessment: Defer to OT evaluation    Lower Extremity Assessment Lower Extremity Assessment: RLE deficits/detail;Generalized weakness;LLE deficits/detail RLE Deficits / Details: numbness/tingling feet and into LEs RLE Sensation: decreased light touch LLE Deficits / Details: numbness/tingling feet and into LEs LLE Sensation: decreased light touch    Cervical / Trunk Assessment Cervical / Trunk Assessment: Normal  Communication   Communication: No difficulties  Cognition Arousal/Alertness: Awake/alert Behavior During Therapy: WFL for tasks assessed/performed Overall Cognitive Status: Within Functional Limits for tasks assessed                                        General Comments General comments (skin integrity, edema, etc.): Sp02 remained >95% on 3L/min 02 [place] with activity.    Exercises     Assessment/Plan    PT Assessment Patient needs continued PT services  PT Problem List Decreased strength;Decreased mobility;Decreased balance;Impaired sensation;Decreased knowledge of use of DME;Decreased activity tolerance;Decreased range of motion       PT Treatment Interventions Therapeutic exercise;Gait training;Patient/family education;Therapeutic activities;Functional mobility training;Balance training;DME instruction    PT Goals (Current goals can be found in the Care Plan section)  Acute Rehab PT Goals Patient Stated Goal: to go home PT Goal Formulation: With patient Time For Goal Achievement: 01/15/21 Potential to Achieve Goals: Good    Frequency Min 3X/week   Barriers to discharge Decreased caregiver support      Co-evaluation PT/OT/SLP  Co-Evaluation/Treatment: Yes Reason for Co-Treatment: For patient/therapist safety;To address functional/ADL transfers PT goals addressed during session: Mobility/safety with mobility;Balance;Strengthening/ROM         AM-PAC PT "6 Clicks" Mobility  Outcome Measure Help needed turning from your back to your side while in a flat bed without using bedrails?: A Little Help needed moving from lying on your back to sitting on the side of a flat bed without using bedrails?: A Little Help needed moving to and from a bed to a chair (including a wheelchair)?: A Little Help needed standing up from a chair using your arms (e.g., wheelchair or bedside chair)?: A Little Help needed to walk in hospital room?: A Little Help needed climbing 3-5 steps with a railing? : A Little 6 Click Score: 18    End of Session Equipment Utilized During Treatment: Oxygen Activity Tolerance: Patient limited by fatigue;Patient tolerated treatment well Patient left: in chair;with call bell/phone within reach Nurse Communication: Mobility status PT Visit Diagnosis: Difficulty in walking, not elsewhere classified (R26.2);Other abnormalities of gait and mobility (R26.89);Unsteadiness on feet (R26.81)    Time: 1884-1660 PT Time Calculation (min) (ACUTE ONLY): 47 min   Charges:   PT Evaluation $PT Eval Moderate Complexity: 1 Mod PT Treatments $Therapeutic Activity: 8-22 mins        Vale Haven, PT, DPT Acute Rehabilitation Services Pager [phone] Office [phone]      Jeffrey Skinner 01/01/2021,  12:24 PM

## 2021-01-01 NOTE — Evaluation (Signed)
Occupational Therapy Evaluation Patient Details Name: Jeffrey Skinner MRN: [id number] DOB: 12-Feb-1956 Today's Date: 01/01/2021    History of Present Illness Patient is a 65 y/o male who presents with SOB and chest pain 12/30/20. Admitted with acute on chronic respiratory failure with hypoxia secondary to SIRS in setting of abdominal pain and atypical chest pain. PMH includes morbid obesity, HTB, OSA, chronic respiratory failure on 3L 02 [place], COPD, depression.   Clinical Impression   PATIENT WAS SEEN FOR SKILLED OT TO MAXIMIZE I AND SAFETY WITH ADLS AND MOBILITY. PATIENT WAS ABLE TO PREFORM SIT TO STAND FROM BSC WITHOUT ASSIST. PATIENT NEEDED ASSIST TO CLEAN BACK PERI AREA. PATIENT STATES HE IS ABLE TO CLEAN HIMSELF AT HOME BY LAYING ON HIS BED FACE DOWN. PATIENT STATES HE DOES NOT WEAR SOCKS AT HOME AND IS ABLE TO DON FLIP FLOPS WITHOUT ASSIST. PATIENT STATES HE IS AT BASELINE FOR ADLS. NO FURTHER OT REQUIRED AT THIS TIME.     Follow Up Recommendations  No OT follow up    Equipment Recommendations  None recommended by OT    Recommendations for Other Services       Precautions / Restrictions Precautions Precautions: Fall Precaution Comments: hx of falls Restrictions Weight Bearing Restrictions: No      Mobility Bed Mobility               General bed mobility comments: Sitting on BSC upon PT arrival.    Transfers Overall transfer level: Needs assistance Equipment used: Rolling walker (2 wheeled) Transfers: Sit to/from Stand Sit to Stand: Min guard         General transfer comment: Min guard for safety. Stood from [facility] x1 with increased time, no assist needed. Transferred to chair post ambulation.    Balance Overall balance assessment: History of Falls;Needs assistance Sitting-balance support: Feet supported;No upper extremity supported Sitting balance-Leahy Scale: Good Sitting balance - Comments: supervision for safety.   Standing balance support: During functional  activity Standing balance-Leahy Scale: Fair Standing balance comment: Able to stand statically and walk short distances but likely does better with UE support with balance challenged.                           ADL either performed or assessed with clinical judgement   ADL Overall ADL's : At baseline (PATIENT NEEDS HELP FOR BACK PERI HYGIENE IN HOSPITAL SETTING BUT STATES HE HAs A SYSTEM AT HOME THAT WORKS FOR HIM. PATIENT WEARS FLIP FLOPS AT HOME AND PULL UP PANTS. PATIENT STATES HE WILL NOT HAVE DIFFICULTY AT HOME.)                                       General ADL Comments: PATIENT NEEDED ASSIST IN HOSPITAL SETTING BUT STATES HE WILL NOT NEED ASSIST AT HOME SECONDARY HE HAS HIS OWN SETUP THERE.     Vision Baseline Vision/History: Wears glasses Patient Visual Report: No change from baseline Vision Assessment?: No apparent visual deficits     Perception     Praxis      Pertinent Vitals/Pain Pain Assessment: No/denies pain     Hand Dominance Right   Extremity/Trunk Assessment Upper Extremity Assessment Upper Extremity Assessment:  (R UE is WNL. L shld 90 and aarom 100.)   Lower Extremity Assessment Lower Extremity Assessment: Defer to PT evaluation RLE Deficits / Details: numbness/tingling feet and  into LEs RLE Sensation: decreased light touch LLE Deficits / Details: numbness/tingling feet and into LEs LLE Sensation: decreased light touch   Cervical / Trunk Assessment Cervical / Trunk Assessment: Normal   Communication Communication Communication: No difficulties   Cognition Arousal/Alertness: Awake/alert Behavior During Therapy: WFL for tasks assessed/performed Overall Cognitive Status: Within Functional Limits for tasks assessed                                     General Comments  PATIENT O2 REMAINED IN THE 90S WITH 3L    Exercises     Shoulder Instructions      Home Living Family/patient expects to be discharged  to:: Private residence Living Arrangements: Spouse/significant other;Children Available Help at Discharge: Family;Available PRN/intermittently Type of Home: House Home Access: Ramped entrance     Home Layout: Two level;Laundry or work area in Artist of Steps: 1 flight Alternate Level Stairs-Rails: Right Bathroom Shower/Tub: Chief Strategy Officer: Standard     Home Equipment: None   Additional Comments: rigged up something to sit in in shower.      Prior Functioning/Environment Level of Independence: Needs assistance  Gait / Transfers Assistance Needed: Independent, mininal ambulator, max 30-40' at a time. Walks to/from truck.  Reports 2 falls in last 6 months. Wears 3L 02 at home. Drives. Reports feeling dizzy a lot. ADL's / Homemaking Assistance Needed: Independent but does not wear shoes/socks, wears flip flops.   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        OT Problem List:        OT Treatment/Interventions:      OT Goals(Current goals can be found in the care plan section) Acute Rehab OT Goals Patient Stated Goal: TO GO HOME  OT Frequency:     Barriers to D/C:            Co-evaluation PT/OT/SLP Co-Evaluation/Treatment: Yes Reason for Co-Treatment: For patient/therapist safety PT goals addressed during session: Mobility/safety with mobility;Balance;Strengthening/ROM OT goals addressed during session: ADL's and self-care      AM-PAC OT "6 Clicks" Daily Activity     Outcome Measure Help from another person eating meals?: None Help from another person taking care of personal grooming?: None Help from another person toileting, which includes using toliet, bedpan, or urinal?: A Little Help from another person bathing (including washing, rinsing, drying)?: A Little Help from another person to put on and taking off regular upper body clothing?: None Help from another person to put on and taking off regular  lower body clothing?: A Little 6 Click Score: 21   End of Session Equipment Utilized During Treatment: Oxygen Nurse Communication:  (OK THERAPY)  Activity Tolerance: Patient tolerated treatment well Patient left: in chair  OT Visit Diagnosis: Unsteadiness on feet (R26.81)                Time: 0814-4818 OT Time Calculation (min): 47 min Charges:  OT General Charges $OT Visit: 1 Visit OT Evaluation $OT Eval Moderate Complexity: 1 Mod  Jeffrey Skinner OT/L   Yarrow Linhart 01/01/2021, 1:23 PM

## 2021-01-01 NOTE — Progress Notes (Signed)
Pharmacy Antibiotic Note  Jeffrey Skinner is a 65 y.o. male admitted on 12/29/2020 with sepsis.  Pharmacy has been consulted for piperacillin/tazobactam  dosing.  WBC/PCT wnl. CXR not indicative of pna. Culture NGTD. Discussed with MD, ok to switching to amox/clav.   Plan:  Stop Pip/tazo Start amox/clav 872-125mg  Q12hr   Height: 6\' 4"  (193 cm) Weight: (!) 232.2 kg (512 lb) IBW/kg (Calculated) : 86.8  Temp (24hrs), Avg:98.2 F (36.8 C), Min:97.7 F (36.5 C), Max:98.9 F (37.2 C)  Recent Labs  Lab 12/29/20 1304 12/30/20 0013 12/30/20 0419 12/31/20 0334  WBC 6.2 6.7 6.5 8.3  CREATININE 0.94 0.98 1.13 1.05  LATICACIDVEN 2.5* 0.7 0.9  --     Estimated Creatinine Clearance: 147.7 mL/min (by C-G formula based on SCr of 1.05 mg/dL).    Allergies  Allergen Reactions  . Dog Epithelium     Antimicrobials this admission: Amox/clav 4/4>>  pitpazo 4/1 >> 4/4 vanc 4/1 >> 4/3    Microbiology results: 4/1 BCx: ngtd  Thank you for allowing pharmacy to be a part of this patient's care.  6/1, PharmD, BCPS, BCCP Clinical Pharmacist  Please check AMION for all [facility] Pharmacy phone numbers After 10:00 PM, call Main Pharmacy [phone]

## 2021-01-02 DIAGNOSIS — E662 Morbid (severe) obesity with alveolar hypoventilation: Secondary | ICD-10-CM

## 2021-01-02 DIAGNOSIS — G4733 Obstructive sleep apnea (adult) (pediatric): Secondary | ICD-10-CM

## 2021-01-02 DIAGNOSIS — J9621 Acute and chronic respiratory failure with hypoxia: Secondary | ICD-10-CM | POA: Diagnosis not present

## 2021-01-02 DIAGNOSIS — J410 Simple chronic bronchitis: Secondary | ICD-10-CM | POA: Diagnosis not present

## 2021-01-02 DIAGNOSIS — R1010 Upper abdominal pain, unspecified: Secondary | ICD-10-CM | POA: Diagnosis not present

## 2021-01-02 DIAGNOSIS — I10 Essential (primary) hypertension: Secondary | ICD-10-CM

## 2021-01-02 LAB — URINE CULTURE: Culture: NO GROWTH

## 2021-01-02 MED ORDER — FLUTICASONE FUROATE-VILANTEROL 200-25 MCG/INH IN AEPB: 1.0000 | INHALATION_SPRAY | Freq: Every day | RESPIRATORY_TRACT | 1 refills | Status: DC

## 2021-01-02 MED ORDER — LOSARTAN POTASSIUM 50 MG PO TABS: 50.0000 mg | ORAL_TABLET | Freq: Every day | ORAL | 3 refills | Status: DC

## 2021-01-02 MED ORDER — BENZONATATE 100 MG PO CAPS: 100.0000 mg | ORAL_CAPSULE | Freq: Three times a day (TID) | ORAL | 0 refills | Status: DC | PRN

## 2021-01-02 MED ORDER — BENZONATATE 100 MG PO CAPS: 100.0000 mg | ORAL_CAPSULE | Freq: Three times a day (TID) | ORAL | Status: DC | PRN

## 2021-01-02 MED ORDER — AMOXICILLIN-POT CLAVULANATE 875-125 MG PO TABS: 1.0000 | ORAL_TABLET | Freq: Two times a day (BID) | ORAL | 0 refills | Status: AC

## 2021-01-02 NOTE — Progress Notes (Signed)
Progress Note  Patient Name: Jeffrey Skinner Date of Encounter: 01/02/2021  [facility] Cardiologist: Aurelio Mccamy   Subjective   65 year old gentleman with a history of hypertension, COPD, morbid obesity.  We are asked to see him today for respiratory distress.  Echocardiogram has revealed normal systolic and diastolic function.  He is clear that he has obesity related congestive heart failure, obesity hypoventilation and obstructive sleep apnea that are causing his symptoms.  Inpatient Medications    Scheduled Meds: . amLODipine  5 mg Oral Daily  . amoxicillin-clavulanate  1 tablet Oral Q12H  . budesonide (PULMICORT) nebulizer solution  0.25 mg Nebulization BID  . enoxaparin (LOVENOX) injection  110 mg Subcutaneous Daily  . feeding supplement  237 mL Oral BID BM  . furosemide  80 mg Oral Daily  . losartan  50 mg Oral Daily   Continuous Infusions:  PRN Meds: acetaminophen, albuterol, furosemide   Vital Signs    Vitals:   01/01/21 1935 01/01/21 2048 01/02/21 0103 01/02/21 0333  BP: 122/77   (!) 152/83  Pulse: 62  75 80  Resp: 17  18 20   Temp: 98.5 F (36.9 C)   98.6 F (37 C)  TempSrc: Oral     SpO2: 94% 96% 98% 97%  Weight:    (!) 229.5 kg  Height:        Intake/Output Summary (Last 24 hours) at 01/02/2021 0905 Last data filed at 01/02/2021 0000 Gross per 24 hour  Intake 220 ml  Output 975 ml  Net -755 ml   Last 3 Weights 01/02/2021 01/01/2021 12/31/2020  Weight (lbs) 506 lb 512 lb 511 lb  Weight (kg) 229.52 kg 232.242 kg 231.788 kg      Telemetry     NSR - Personally Reviewed  ECG     - Personally Reviewed  Physical Exam   GEN: No acute distress.  Morbidly obese gentleman, no acute distress Neck: No JVD Cardiac: RRR, no murmurs, rubs, or gallops.  Respiratory: Clear to auscultation bilaterally. GI: Soft, nontender, non-distended  MS: No edema; No deformity. Neuro:  Nonfocal  Psych: Normal affect   Labs    High Sensitivity Troponin:   Recent Labs   Lab 12/29/20 1304 12/30/20 0013  TROPONINIHS 5 4      Chemistry Recent Labs  Lab 12/29/20 1304 12/30/20 0013 12/30/20 0419 12/31/20 0334  NA 135  --  134* 139  K 3.7  --  3.8 4.5  CL 91*  --  94* 98  CO2 34*  --  35* 36*  GLUCOSE 112*  --  119* 117*  BUN <5*  --  11 10  CREATININE 0.94 0.98 1.13 1.05  CALCIUM 8.4*  --  7.6* 8.0*  PROT 8.6*  --  7.1 6.9  ALBUMIN 3.4*  --  2.7* 2.6*  AST 30  --  27 23  ALT 26  --  28 23  ALKPHOS 78  --  66 59  BILITOT 1.0  --  0.8 0.8  GFRNONAA >60 >60 >60 >60  ANIONGAP 10  --  5 5     Hematology Recent Labs  Lab 12/30/20 0013 12/30/20 0419 12/31/20 0334  WBC 6.7 6.5 8.3  RBC 4.58 4.43 4.17*  HGB 13.1 12.6* 11.9*  HCT 44.1 43.0 40.7  MCV 96.3 97.1 97.6  MCH 28.6 28.4 28.5  MCHC 29.7* 29.3* 29.2*  RDW 14.0 14.1 13.7  PLT 146* 156 153    BNP Recent Labs  Lab 12/29/20 1308  BNP  11.2     DDimer No results for input(s): DDIMER in the last 168 hours.   Radiology    ECHOCARDIOGRAM COMPLETE  Result Date: 01/01/2021    ECHOCARDIOGRAM REPORT   Patient Name:   Jeffrey Skinner Date of Exam: 01/01/2021 Medical Rec #:  [id number]     Height:       76.0 in Accession #:    [id number]    Weight:       512.0 lb Date of Birth:  1956/06/24     BSA:          3.303 m Patient Age:    65 years      BP:           129/81 mmHg Patient Gender: M             HR:           81 bpm. Exam Location:  Inpatient Procedure: 2D Echo Indications:    acute diastolic chf  History:        Patient has prior history of Echocardiogram examinations, most                 recent 06/10/2019. COPD, Signs/Symptoms:Chest Pain; Risk                 Factors:Sleep Apnea.  Sonographer:    Delcie Roch Referring Phys: [id number] SYLVESTER I OGBATA  Sonographer Comments: Patient is morbidly obese. Image acquisition challenging due to patient body habitus. IMPRESSIONS  1. Left ventricular ejection fraction, by estimation, is 60 to 65%. The left ventricle has normal function. The left  ventricle has no regional wall motion abnormalities. There is mild left ventricular hypertrophy. Left ventricular diastolic parameters were normal.  2. Right ventricular systolic function is normal. The right ventricular size is normal.  3. Left atrial size was mildly dilated.  4. The mitral valve is normal in structure. No evidence of mitral valve regurgitation. No evidence of mitral stenosis.  5. The aortic valve is tricuspid. There is mild calcification of the aortic valve. Aortic valve regurgitation is not visualized. No aortic stenosis is present.  6. The inferior vena cava is normal in size with greater than 50% respiratory variability, suggesting right atrial pressure of 3 mmHg. FINDINGS  Left Ventricle: Left ventricular ejection fraction, by estimation, is 60 to 65%. The left ventricle has normal function. The left ventricle has no regional wall motion abnormalities. Definity contrast agent was given IV to delineate the left ventricular  endocardial borders. The left ventricular internal cavity size was normal in size. There is mild left ventricular hypertrophy. Left ventricular diastolic parameters were normal. Right Ventricle: The right ventricular size is normal. No increase in right ventricular wall thickness. Right ventricular systolic function is normal. Left Atrium: Left atrial size was mildly dilated. Right Atrium: Right atrial size was normal in size. Pericardium: There is no evidence of pericardial effusion. Mitral Valve: The mitral valve is normal in structure. No evidence of mitral valve regurgitation. No evidence of mitral valve stenosis. Tricuspid Valve: The tricuspid valve is normal in structure. Tricuspid valve regurgitation is trivial. No evidence of tricuspid stenosis. Aortic Valve: The aortic valve is tricuspid. There is mild calcification of the aortic valve. Aortic valve regurgitation is not visualized. No aortic stenosis is present. Pulmonic Valve: The pulmonic valve was normal in  structure. Pulmonic valve regurgitation is not visualized. No evidence of pulmonic stenosis. Aorta: The aortic root is normal in size and structure. Venous: The inferior vena cava  is normal in size with greater than 50% respiratory variability, suggesting right atrial pressure of 3 mmHg. IAS/Shunts: The interatrial septum was not well visualized.  LEFT VENTRICLE PLAX 2D LVIDd:         5.30 cm Diastology LVIDs:         3.70 cm LV e' medial:    10.20 cm/s LV PW:         1.20 cm LV E/e' medial:  7.3 LV IVS:        1.30 cm LV e' lateral:   13.60 cm/s                        LV E/e' lateral: 5.4  RIGHT VENTRICLE RV S prime:     14.50 cm/s TAPSE (M-mode): 2.2 cm LEFT ATRIUM             Index       RIGHT ATRIUM           Index LA diam:        4.30 cm 1.30 cm/m  RA Area:     19.00 cm LA Vol (A2C):   62.3 ml 18.86 ml/m RA Volume:   54.90 ml  16.62 ml/m LA Vol (A4C):   53.9 ml 16.32 ml/m LA Biplane Vol: 62.0 ml 18.77 ml/m  AORTIC VALVE LVOT Vmax:   108.00 cm/s LVOT Vmean:  74.400 cm/s LVOT VTI:    0.201 m  AORTA Ao Asc diam: 3.70 cm MITRAL VALVE MV Area (PHT): 3.53 cm    SHUNTS MV Decel Time: 215 msec    Systemic VTI: 0.20 m MV E velocity: 74.10 cm/s MV A velocity: 65.60 cm/s MV E/A ratio:  1.13 Charlton Haws MD Electronically signed by Charlton Haws MD Signature Date/Time: 01/01/2021/2:06:58 PM    Final     Cardiac Studies     Patient Profile     66 y.o. male with obesity hypoventilation, obstructive sleep apnea.  Assessment & Plan    Respiratory failure: His respiratory failure is related to his morbid obesity and possibly some aspect of COPD.  His left ventricular systolic and diastolic function is normal.  CTA of the chest was negative for pulmonary embolus.  He is stable to go home today.  He will need to be discharged on CPAP or BiPAP.  He will not need cardiology follow-up.  [facility] will sign off.   Medication Recommendations:  Cont meds  Other recommendations (labs, testing, etc):    Follow up as an outpatient:   With his primary MD.  He does not need cardiology follow up    For questions or updates, please contact [facility] Please consult [email or web address] for contact info under        Signed, Kristeen Miss, MD  01/02/2021, 9:05 AM

## 2021-01-02 NOTE — TOC Transition Note (Signed)
Transition of Care [facility]) - CM/SW Discharge Note   Patient Details  Name: Jeffrey Skinner MRN: [id number] Date of Birth: 1956-08-07  Transition of Care [facility]) CM/SW Contact:  Leone Haven, RN Phone Number: 01/02/2021, 12:33 PM   Clinical Narrative:    Patient for dc home today, NCM offered choice, for [facility] for respiratory care.  He has no preference. NCM made referral to [facility] with [facility]. He is able to take referral. Patient's PCP is Marveen Reeks fax [phone]. CSW is Kennith Center  Phone [phone] ext [phone].  Patient last four is 8520.  He goes to the [facility].  He states he has a cpap machine but needs education on how to work it. It is a Res Med 10.  NCM contacted Zach with [employer] they will have their resp therapist set up an education for the cpap machine. Patient son is bringing oxygen tank to transport patient home with today.   Final next level of care: Home/Self Care Barriers to Discharge: No Barriers Identified   Patient Goals and CMS Choice Patient states their goals for this hospitalization and ongoing recovery are:: return home   Choice offered to / list presented to : NA  Discharge Placement                       Discharge Plan and Services                  DME Agency: NA       HH Arranged: RN HH Agency: [facility] Date [facility] Agency Contacted: 01/02/21 Time HH Agency Contacted: [phone]    Social Determinants of Health (SDOH) Interventions     Readmission Risk Interventions No flowsheet data found.

## 2021-01-02 NOTE — Progress Notes (Signed)
D/C instructions given and reviewed. Tele and IV removed, tolerated well. Calling son to bring clothes.

## 2021-01-02 NOTE — Plan of Care (Signed)

## 2021-01-02 NOTE — Discharge Summary (Signed)
Physician Discharge Summary   Patient ID: Jeffrey Skinner MRN: [id number] DOB/AGE: 1956/07/18 65 y.o.  Admit date: 12/29/2020 Discharge date: 01/02/2021  Primary Care Physician:  Kallie Locks, FNP   Recommendations for Outpatient Follow-up:  1. Follow up with PCP in 1-2 weeks 2. Ambulatory referral sent to pulmonology  Home Health: Home health RN/respiratory therapist for education for the CPAP machine Equipment/Devices:   Discharge Condition: stable  CODE STATUS: FULL  Diet recommendation: Heart healthy diet   Discharge Diagnoses:    Acute on chronic respiratory failure with hypoxia and hypercapnia Mild COPD exacerbation Chronic diastolic CHF . SIRS (systemic inflammatory response syndrome) (HCC), unclear etiology Obstructive sleep apnea, Obesity hypoventilation Essential hypertension Morbid obesity  Consults: Cardiology   Allergies:   Allergies  Allergen Reactions  . Dog Epithelium      DISCHARGE MEDICATIONS: Allergies as of 01/02/2021      Reactions   Dog Epithelium       Medication List    STOP taking these medications   lisinopril 2.5 MG tablet Commonly known as: ZESTRIL     TAKE these medications   albuterol 108 (90 Base) MCG/ACT inhaler Commonly known as: VENTOLIN HFA Inhale 2 puffs into the lungs every 6 (six) hours as needed for wheezing or shortness of breath.   amLODipine 5 MG tablet Commonly known as: NORVASC Take 5 mg by mouth daily.   amoxicillin-clavulanate 875-125 MG tablet Commonly known as: AUGMENTIN Take 1 tablet by mouth 2 (two) times daily for 7 days. Notes to patient: **NEW** To treat infection. Take twice daily until all gone.   benzonatate 100 MG capsule Commonly known as: TESSALON Take 1 capsule (100 mg total) by mouth 3 (three) times daily as needed for cough. Notes to patient: **NEW** For cough   fluticasone furoate-vilanterol 200-25 MCG/INH Aepb Commonly known as: BREO ELLIPTA Inhale 1 puff into the lungs  daily. Notes to patient: **NEW** To improve breathing and prevent shortness of breath. Use EVERY DAY even if you feel good.   furosemide 80 MG tablet Commonly known as: LASIX Take 80 mg by mouth daily.   ipratropium-albuterol 0.5-2.5 (3) MG/3ML Soln Commonly known as: DUONEB Take 3 mLs by nebulization every 6 (six) hours as needed. What changed: reasons to take this   losartan 50 MG tablet Commonly known as: COZAAR Take 1 tablet (50 mg total) by mouth daily. Notes to patient: **NEW** To lower blood pressure   MULTIVITAMIN ADULTS PO Take 1 tablet by mouth daily.   naproxen 500 MG tablet Commonly known as: NAPROSYN Take 500 mg by mouth in the morning and at bedtime. Pain & inflammation   Nebulizer Devi by Does not apply route.   Vitamin D (Ergocalciferol) 1.25 MG (50000 UNIT) Caps capsule Commonly known as: DRISDOL TAKE 1 CAPSULE (50,000 UNITS TOTAL) BY MOUTH EVERY 7 (SEVEN) DAYS.        Brief H and P: For complete details please refer to admission H and P, but in brief Patient is a 65 year old male with a history of COPD, chronic respiratory failure on supplemental O2, 3 L at baseline, hypertension, chronic diastolic CHF, EF 60 to 65%, OSA presented to ED with shortness of breath and chest pain.  Patient reports baseline shortness of breath that has been ongoing for several months.  He also reported new onset of low bilateral chest pain and upper abdominal chest pain which started a day before the admission and was persistent, 8/10, associated with worsening shortness of breath.  He also  endorsed chronic leg swelling. In ED, had a temp of 101 F, tachycardic and tachypneic.   CTA chest negative for PE, abdomen pelvis showed nonobstructive bilateral nephrolithiasis, bilateral fat-containing inguinal hernia, no bowel obstruction.  Patient was placed on IV vancomycin and Zosyn and admitted for further work-up  Hospital Course:    Acute on chronic respiratory failure with hypoxia  and hypercapnia (HCC), multifactorial likely due to COPD exacerbation and diastolic CHF exacerbation, underlying OHV and OSA, noncompliant with CPAP (broken) -On admission, ABG showed pH of 7.2, PCO2 79.7, PO2 137.  BNP 11.2 however patient had reported bilateral chest pain and worsening shortness of breath in the last several months.  Chest x-ray showed pulmonary vascular congestion. -CTA chest abdomen pelvis showed no central embolus, low lung volumes, nonobstructive bilateral nephrolithiasis.  Doppler ultrasound RLE negative for DVT. -Continue albuterol inhaler, started on Breo, continue O2 via nasal cannula.  Ambulatory referral sent to pulmonology for follow-up outpatient   Chronic diastolic CHF --2D echo in 06/2019 had shown EF of 60 to 65%, BNP unreliable, morbidly obese -Given history of worsening shortness of breath, abdominal bloating, peripheral edema, chest pain, cardiology was consulted. Patient reports that he follows [facility] and has been having difficulty with appointments -2D echo showed EF of 60 to 65%, normal diastolic parameters, normal RV systolic function -Per cardiology respiratory failure related to morbid obesity and possible COPD, has underlying OSA and OHV -Recommended continue Lasix and DC on CPAP     SIRS (systemic inflammatory response syndrome) (HCC) unclear etiology -Patient was placed on IV vancomycin and Zosyn, normal procalcitonin -No systemic signs of infection, CTA chest and abdomen pelvis negative for consolidation, pneumonia or intra-abdominal pathology.  UA negative for UTI -Cultures negative, patient remained afebrile -Continue Augmentin for 7 days, no acute issues   Obstructive sleep apnea, OHV -At baseline on 3 L O2 via [place], states has not been using CPAP -Per patient, he has CPAP and thinks it is broken and also needs education for CPAP use.  Home health was arranged  Essential hypertension -Continue Lasix 80 mg daily  Morbid  obesity Estimated body mass index is 62.32 kg/m as calculated from the following:   Height as of this encounter: 6\' 4"  (1.93 m).   Weight as of this encounter: 232.2 kg.  Diet and lifestyle changes counseling was done  Day of Discharge S: No acute issues, feels back to his baseline.  No chest pain.  No acute shortness of breath.  On 3 L O2 via [place], baseline  BP 131/78 (BP Location: Left Wrist)   Pulse 77   Temp 97.7 F (36.5 C) (Oral)   Resp 19   Ht 6\' 4"  (1.93 m)   Wt (!) 229.5 kg   SpO2 100%   BMI 61.59 kg/m   Physical Exam: General: Alert and awake oriented x3 not in any acute distress. CVS: S1-S2 clear no murmur rubs or gallops Chest: clear to auscultation bilaterally, no wheezing rales or rhonchi Abdomen: Obesity, soft nontender, nondistended, normal bowel sounds Extremities: no cyanosis, clubbing or edema noted bilaterally Neuro: no new deficits    Get Medicines reviewed and adjusted: Please take all your medications with you for your next visit with your Primary MD  Please request your Primary MD to go over all hospital tests and procedure/radiological results at the follow up. Please ask your Primary MD to get all Hospital records sent to his/her office.  If you experience worsening of your admission symptoms, develop shortness of  breath, life threatening emergency, suicidal or homicidal thoughts you must seek medical attention immediately by calling 911 or calling your MD immediately  if symptoms less severe.  You must read complete instructions/literature along with all the possible adverse reactions/side effects for all the Medicines you take and that have been prescribed to you. Take any new Medicines after you have completely understood and accept all the possible adverse reactions/side effects.   Do not drive when taking pain medications.   Do not take more than prescribed Pain, Sleep and Anxiety Medications  Special Instructions: If you have smoked or chewed  Tobacco  in the last 2 yrs please stop smoking, stop any regular Alcohol  and or any Recreational drug use.  Wear Seat belts while driving.  Please note  You were cared for by a hospitalist during your hospital stay. Once you are discharged, your primary care physician will handle any further medical issues. Please note that NO REFILLS for any discharge medications will be authorized once you are discharged, as it is imperative that you return to your primary care physician (or establish a relationship with a primary care physician if you do not have one) for your aftercare needs so that they can reassess your need for medications and monitor your lab values.   The results of significant diagnostics from this hospitalization (including imaging, microbiology, ancillary and laboratory) are listed below for reference.      Procedures/Studies:  CT Angio Chest PE W and/or Wo Contrast  Result Date: 12/29/2020 CLINICAL DATA:  Suspected high probability pulmonary embolus. Fever of unknown origin. Diverticulitis suspected. Cough, shortness of breath, diffuse epigastric pain, nausea, constipation. EXAM: CT ANGIOGRAPHY CHEST CT ABDOMEN AND PELVIS WITH CONTRAST TECHNIQUE: Multidetector CT imaging of the chest was performed using the standard protocol during bolus administration of intravenous contrast. Multiplanar CT image reconstructions and MIPs were obtained to evaluate the vascular anatomy. Multidetector CT imaging of the abdomen and pelvis was performed using the standard protocol during bolus administration of intravenous contrast. CONTRAST:  OMNIPAQUE IOHEXOL 350 MG/ML SOLN COMPARISON:  CT angio chest 03/05/2018 FINDINGS: CTA CHEST FINDINGS Cardiovascular: Fair opacification of the pulmonary arteries to the proximal segmental level. No evidence of pulmonary embolism. Normal heart size. No pericardial effusion. Mediastinum/Nodes: Redemonstration of calcified right inter lobar nodes that are not large  enlarged in size. No enlarged mediastinal, hilar, or axillary lymph nodes. Thyroid gland, trachea, and esophagus demonstrate no significant findings. Lungs/Pleura: Expiratory phase of respiration. Low lung volumes. Linear atelectasis. No focal consolidation. Calcified nodules at the right base. No pulmonary mass. Musculoskeletal: No chest wall abnormality. No suspicious lytic or blastic osseous lesions. No acute displaced fracture. Review of the MIP images confirms the above findings. CT ABDOMEN and PELVIS FINDINGS Hepatobiliary: No focal liver abnormality. No gallstones, gallbladder wall thickening, or pericholecystic fluid. No biliary dilatation. Pancreas: No adrenal nodule bilaterally. Bilateral kidneys enhance symmetrically. No hydronephrosis. No hydroureter. The urinary bladder is unremarkable. Spleen: Normal in size without focal abnormality. Adrenals/Urinary Tract: No adrenal nodule bilaterally. Bilateral kidneys enhance symmetrically. Bilateral punctate nephrolithiasis. Up to 3 mm calcified stone on the left. No hydronephrosis. 1.5 cm fluid density exophytic right kidney lesion that likely represents a simple renal cyst. Otherwise no contour-deforming renal mass. No ureterolithiasis or hydroureter. The urinary bladder is unremarkable. On delayed imaging, there is no urothelial wall thickening and there are no filling defects in the opacified portions of the bilateral collecting systems or ureters. Stomach/Bowel: Stomach is within normal limits. No evidence of  bowel wall thickening or dilatation. The appendix not definitely identified. Vascular/Lymphatic: No significant vascular findings are present. No enlarged abdominal or pelvic lymph nodes. Reproductive: Prostate is unremarkable. Other: No intraperitoneal free fluid. No intraperitoneal free gas. No organized fluid collection. Musculoskeletal: Bilateral fat containing small inguinal hernias. No suspicious lytic or blastic osseous lesions. No acute  displaced fracture. Multilevel degenerative changes of the spine. Review of the MIP images confirms the above findings. IMPRESSION: 1. No central pulmonary embolus. Markedly limited evaluation distally due to timing of contrast. 2. No acute intrathoracic abnormality in a patient with low lung volumes. 3. Nonobstructive bilateral nephrolithiasis. 4. Bilateral fat containing inguinal hernias with no findings suggest ischemia or associated bowel obstruction. 5. Right lung sequelae of prior granulomatous disease. Electronically Signed   By: Tish Frederickson M.D.   On: 12/29/2020 18:35   CT ABDOMEN PELVIS W CONTRAST  Result Date: 12/29/2020 CLINICAL DATA:  Suspected high probability pulmonary embolus. Fever of unknown origin. Diverticulitis suspected. Cough, shortness of breath, diffuse epigastric pain, nausea, constipation. EXAM: CT ANGIOGRAPHY CHEST CT ABDOMEN AND PELVIS WITH CONTRAST TECHNIQUE: Multidetector CT imaging of the chest was performed using the standard protocol during bolus administration of intravenous contrast. Multiplanar CT image reconstructions and MIPs were obtained to evaluate the vascular anatomy. Multidetector CT imaging of the abdomen and pelvis was performed using the standard protocol during bolus administration of intravenous contrast. CONTRAST:  OMNIPAQUE IOHEXOL 350 MG/ML SOLN COMPARISON:  CT angio chest 03/05/2018 FINDINGS: CTA CHEST FINDINGS Cardiovascular: Fair opacification of the pulmonary arteries to the proximal segmental level. No evidence of pulmonary embolism. Normal heart size. No pericardial effusion. Mediastinum/Nodes: Redemonstration of calcified right inter lobar nodes that are not large enlarged in size. No enlarged mediastinal, hilar, or axillary lymph nodes. Thyroid gland, trachea, and esophagus demonstrate no significant findings. Lungs/Pleura: Expiratory phase of respiration. Low lung volumes. Linear atelectasis. No focal consolidation. Calcified nodules at the  right base. No pulmonary mass. Musculoskeletal: No chest wall abnormality. No suspicious lytic or blastic osseous lesions. No acute displaced fracture. Review of the MIP images confirms the above findings. CT ABDOMEN and PELVIS FINDINGS Hepatobiliary: No focal liver abnormality. No gallstones, gallbladder wall thickening, or pericholecystic fluid. No biliary dilatation. Pancreas: No adrenal nodule bilaterally. Bilateral kidneys enhance symmetrically. No hydronephrosis. No hydroureter. The urinary bladder is unremarkable. Spleen: Normal in size without focal abnormality. Adrenals/Urinary Tract: No adrenal nodule bilaterally. Bilateral kidneys enhance symmetrically. Bilateral punctate nephrolithiasis. Up to 3 mm calcified stone on the left. No hydronephrosis. 1.5 cm fluid density exophytic right kidney lesion that likely represents a simple renal cyst. Otherwise no contour-deforming renal mass. No ureterolithiasis or hydroureter. The urinary bladder is unremarkable. On delayed imaging, there is no urothelial wall thickening and there are no filling defects in the opacified portions of the bilateral collecting systems or ureters. Stomach/Bowel: Stomach is within normal limits. No evidence of bowel wall thickening or dilatation. The appendix not definitely identified. Vascular/Lymphatic: No significant vascular findings are present. No enlarged abdominal or pelvic lymph nodes. Reproductive: Prostate is unremarkable. Other: No intraperitoneal free fluid. No intraperitoneal free gas. No organized fluid collection. Musculoskeletal: Bilateral fat containing small inguinal hernias. No suspicious lytic or blastic osseous lesions. No acute displaced fracture. Multilevel degenerative changes of the spine. Review of the MIP images confirms the above findings. IMPRESSION: 1. No central pulmonary embolus. Markedly limited evaluation distally due to timing of contrast. 2. No acute intrathoracic abnormality in a patient with low  lung volumes. 3. Nonobstructive bilateral nephrolithiasis.  4. Bilateral fat containing inguinal hernias with no findings suggest ischemia or associated bowel obstruction. 5. Right lung sequelae of prior granulomatous disease. Electronically Signed   By: Tish Frederickson M.D.   On: 12/29/2020 18:35   DG Chest Port 1 View  Result Date: 12/29/2020 CLINICAL DATA:  Shortness of breath, fever. EXAM: PORTABLE CHEST 1 VIEW COMPARISON:  June 11, 2019. FINDINGS: Stable cardiomegaly. No pneumothorax is noted. Mild central pulmonary vascular congestion is noted with probable mild bilateral pulmonary edema. No significant pleural effusion is noted. Bony thorax is unremarkable. IMPRESSION: Stable cardiomegaly with central pulmonary vascular congestion. Mild bilateral pulmonary edema is noted. Electronically Signed   By: Lupita Raider M.D.   On: 12/29/2020 13:41   ECHOCARDIOGRAM COMPLETE  Result Date: 01/01/2021    ECHOCARDIOGRAM REPORT   Patient Name:   Jeffrey Skinner Date of Exam: 01/01/2021 Medical Rec #:  [id number]     Height:       76.0 in Accession #:    [id number]    Weight:       512.0 lb Date of Birth:  Dec 09, 1955     BSA:          3.303 m Patient Age:    65 years      BP:           129/81 mmHg Patient Gender: M             HR:           81 bpm. Exam Location:  Inpatient Procedure: 2D Echo Indications:    acute diastolic chf  History:        Patient has prior history of Echocardiogram examinations, most                 recent 06/10/2019. COPD, Signs/Symptoms:Chest Pain; Risk                 Factors:Sleep Apnea.  Sonographer:    Delcie Roch Referring Phys: [id number] SYLVESTER I OGBATA  Sonographer Comments: Patient is morbidly obese. Image acquisition challenging due to patient body habitus. IMPRESSIONS  1. Left ventricular ejection fraction, by estimation, is 60 to 65%. The left ventricle has normal function. The left ventricle has no regional wall motion abnormalities. There is mild left ventricular hypertrophy.  Left ventricular diastolic parameters were normal.  2. Right ventricular systolic function is normal. The right ventricular size is normal.  3. Left atrial size was mildly dilated.  4. The mitral valve is normal in structure. No evidence of mitral valve regurgitation. No evidence of mitral stenosis.  5. The aortic valve is tricuspid. There is mild calcification of the aortic valve. Aortic valve regurgitation is not visualized. No aortic stenosis is present.  6. The inferior vena cava is normal in size with greater than 50% respiratory variability, suggesting right atrial pressure of 3 mmHg. FINDINGS  Left Ventricle: Left ventricular ejection fraction, by estimation, is 60 to 65%. The left ventricle has normal function. The left ventricle has no regional wall motion abnormalities. Definity contrast agent was given IV to delineate the left ventricular  endocardial borders. The left ventricular internal cavity size was normal in size. There is mild left ventricular hypertrophy. Left ventricular diastolic parameters were normal. Right Ventricle: The right ventricular size is normal. No increase in right ventricular wall thickness. Right ventricular systolic function is normal. Left Atrium: Left atrial size was mildly dilated. Right Atrium: Right atrial size was normal in size. Pericardium: There is no evidence of  pericardial effusion. Mitral Valve: The mitral valve is normal in structure. No evidence of mitral valve regurgitation. No evidence of mitral valve stenosis. Tricuspid Valve: The tricuspid valve is normal in structure. Tricuspid valve regurgitation is trivial. No evidence of tricuspid stenosis. Aortic Valve: The aortic valve is tricuspid. There is mild calcification of the aortic valve. Aortic valve regurgitation is not visualized. No aortic stenosis is present. Pulmonic Valve: The pulmonic valve was normal in structure. Pulmonic valve regurgitation is not visualized. No evidence of pulmonic stenosis. Aorta:  The aortic root is normal in size and structure. Venous: The inferior vena cava is normal in size with greater than 50% respiratory variability, suggesting right atrial pressure of 3 mmHg. IAS/Shunts: The interatrial septum was not well visualized.  LEFT VENTRICLE PLAX 2D LVIDd:         5.30 cm Diastology LVIDs:         3.70 cm LV e' medial:    10.20 cm/s LV PW:         1.20 cm LV E/e' medial:  7.3 LV IVS:        1.30 cm LV e' lateral:   13.60 cm/s                        LV E/e' lateral: 5.4  RIGHT VENTRICLE RV S prime:     14.50 cm/s TAPSE (M-mode): 2.2 cm LEFT ATRIUM             Index       RIGHT ATRIUM           Index LA diam:        4.30 cm 1.30 cm/m  RA Area:     19.00 cm LA Vol (A2C):   62.3 ml 18.86 ml/m RA Volume:   54.90 ml  16.62 ml/m LA Vol (A4C):   53.9 ml 16.32 ml/m LA Biplane Vol: 62.0 ml 18.77 ml/m  AORTIC VALVE LVOT Vmax:   108.00 cm/s LVOT Vmean:  74.400 cm/s LVOT VTI:    0.201 m  AORTA Ao Asc diam: 3.70 cm MITRAL VALVE MV Area (PHT): 3.53 cm    SHUNTS MV Decel Time: 215 msec    Systemic VTI: 0.20 m MV E velocity: 74.10 cm/s MV A velocity: 65.60 cm/s MV E/A ratio:  1.13 Charlton Haws MD Electronically signed by Charlton Haws MD Signature Date/Time: 01/01/2021/2:06:58 PM    Final    VAS [place] LOWER EXTREMITY VENOUS (DVT)  Result Date: 12/30/2020  Lower Venous DVT Study Indications: Edema, R>L.  Risk Factors: Patient on BiPap. Limitations: BMI >60 and body habitus. Comparison       Prior Bilateral LE venous duplex done 06/10/19 was Study:           limited/negative Performing Technologist: Sherren Kerns RVS  Examination Guidelines: A complete evaluation includes B-mode imaging, spectral Doppler, color Doppler, and power Doppler as needed of all accessible portions of each vessel. Bilateral testing is considered an integral part of a complete examination. Limited examinations for reoccurring indications may be performed as noted. The reflux portion of the exam is performed with the patient in  reverse Trendelenburg.  +---------+---------------+---------+-----------+----------+--------------+ RIGHT    CompressibilityPhasicitySpontaneityPropertiesThrombus Aging +---------+---------------+---------+-----------+----------+--------------+ CFV      Full           Yes      Yes                                 +---------+---------------+---------+-----------+----------+--------------+  SFJ      Full                                                        +---------+---------------+---------+-----------+----------+--------------+ FV Prox  Full                                                        +---------+---------------+---------+-----------+----------+--------------+ FV Mid   Full                                                        +---------+---------------+---------+-----------+----------+--------------+ FV DistalFull                                                        +---------+---------------+---------+-----------+----------+--------------+ PFV      Full                                                        +---------+---------------+---------+-----------+----------+--------------+ POP      Full           Yes      Yes                                 +---------+---------------+---------+-----------+----------+--------------+ PTV      Full                                                        +---------+---------------+---------+-----------+----------+--------------+ PERO     Full                                                        +---------+---------------+---------+-----------+----------+--------------+   Left Technical Findings: Left leg not evaluated.   Summary: RIGHT: - There is no evidence of deep vein thrombosis in the lower extremity.   *See table(s) above for measurements and observations. Electronically signed by Sherald Hess MD on 12/30/2020 at 5:11:48 PM.    Final        LAB RESULTS: Basic Metabolic  Panel: Recent Labs  Lab 12/30/20 0419 12/31/20 0334  NA 134* 139  K 3.8 4.5  CL 94* 98  CO2 35* 36*  GLUCOSE 119* 117*  BUN 11 10  CREATININE 1.13 1.05  CALCIUM 7.6* 8.0*  MG 1.9 2.0  PHOS 4.5 3.6  Liver Function Tests: Recent Labs  Lab 12/30/20 0419 12/31/20 0334  AST 27 23  ALT 28 23  ALKPHOS 66 59  BILITOT 0.8 0.8  PROT 7.1 6.9  ALBUMIN 2.7* 2.6*   No results for input(s): LIPASE, AMYLASE in the last 168 hours. No results for input(s): AMMONIA in the last 168 hours. CBC: Recent Labs  Lab 12/30/20 0419 12/31/20 0334  WBC 6.5 8.3  NEUTROABS  --  5.0  HGB 12.6* 11.9*  HCT 43.0 40.7  MCV 97.1 97.6  PLT 156 153   Cardiac Enzymes: No results for input(s): CKTOTAL, CKMB, CKMBINDEX, TROPONINI in the last 168 hours. BNP: Invalid input(s): POCBNP CBG: No results for input(s): GLUCAP in the last 168 hours.     Disposition and Follow-up: Discharge Instructions    Ambulatory referral to Pulmonology   Complete by: As directed    Chronic respiratory failure on 3 L O2, likely has OSA/OHV, was admitted with hypercapnic respiratory failure.   Reason for referral: Asthma/COPD   Diet - low sodium heart healthy   Complete by: As directed    Increase activity slowly   Complete by: As directed        DISPOSITION: Home with home health   DISCHARGE FOLLOW-UP  Follow-up Information    Kallie LocksStroud, Natalie M, FNP. Go on 01/09/2021.   Specialty: Family Medicine Why: @1 :40pm Contact information: [address] [place] [address] [phone]        [facility].   Specialty: [facility] Health Services [facility]: [facility] Contact information: [address] [place] [address] [phone]                Time coordinating discharge:  35 minutes  Signed:   Thad Rangeripudeep Sophi Calligan M.D. [facility] 01/02/2021, 1:39 PM

## 2021-01-02 NOTE — Progress Notes (Signed)
Pt placed on BIPAP for night rest.  IPAP decreased to 18 per patients request to much pressure at 20.

## 2021-01-02 NOTE — Discharge Instructions (Signed)
Heart Failure, Diagnosis  Heart failure means that your heart is not able to pump blood in the right way. This makes it hard for your body to work well. Heart failure is usually a long-term (chronic) condition. You must take good care of yourself and follow your treatment plan from your doctor. What are the causes?  High blood pressure.  Buildup of cholesterol and fat in the arteries.  Heart attack. This injures the heart muscle.  Heart valves that do not open and close properly.  Damage of the heart muscle. This is also called cardiomyopathy.  Infection of the heart muscle. This is also called myocarditis.  Lung disease. What increases the risk?  Getting older. The risk of heart failure goes up as a person ages.  Being overweight.  Being male.  Use tobacco or nicotine products.  Abusing alcohol or drugs.  Having taken medicines that can damage the heart.  Having any of these conditions: ? Diabetes. ? Abnormal heart rhythms. ? Thyroid problems. ? Low blood counts (anemia).  Having a family history of heart failure. What are the signs or symptoms?  Shortness of breath.  Coughing.  Swelling of the feet, ankles, legs, or belly.  Losing or gaining weight for no reason.  Trouble breathing.  Waking from sleep because of the need to sit up and get more air.  Fast heartbeat.  Being very tired.  Feeling dizzy, or feeling like you may pass out (faint).  Having no desire to eat.  Feeling like you may vomit (nauseous).  Peeing (urinating) more at night.  Feeling confused. How is this treated? This condition may be treated with:  Medicines. These can be given to treat blood pressure and to make the heart muscles stronger.  Changes in your daily life. These may include: ? Eating a healthy diet. ? Staying at a healthy body weight. ? Quitting tobacco, alcohol, and drug use. ? Doing exercises. ? Participating in a cardiac rehabilitation program. This program  helps you improve your health through exercise, education, and counseling.  Surgery. Surgery can be done to open blocked valves, or to put devices in the heart, such as pacemakers.  A donor heart (heart transplant). You will receive a healthy heart from a donor. Follow these instructions at home:  Treat other conditions as told by your doctor. These may include high blood pressure, diabetes, thyroid disease, or abnormal heart rhythms.  Learn as much as you can about heart failure.  Get support as you need it.  Keep all follow-up visits. Summary  Heart failure means that your heart is not able to pump blood in the right way.  This condition is often caused by high blood pressure, heart attack, or damage of the heart muscle.  Symptoms of this condition include shortness of breath and swelling of the feet, ankles, legs, or belly. You may also feel very tired or feel like you may vomit.  You may be treated with medicines, surgery, or changes in your daily life.  Treat other health conditions as told by your doctor. This information is not intended to replace advice given to you by your health care provider. Make sure you discuss any questions you have with your health care provider. Document Revised: 04/08/2020 Document Reviewed: 04/08/2020 Elsevier Patient Education  2021 Elsevier Inc.  

## 2021-01-03 LAB — CULTURE, BLOOD (ROUTINE X 2)
Culture: NO GROWTH
Culture: NO GROWTH
Special Requests: ADEQUATE

## 2021-01-09 ENCOUNTER — Ambulatory Visit (INDEPENDENT_AMBULATORY_CARE_PROVIDER_SITE_OTHER): Payer: 59 | Admitting: Family Medicine

## 2021-01-09 ENCOUNTER — Other Ambulatory Visit: Payer: Self-pay

## 2021-01-09 ENCOUNTER — Encounter: Payer: Self-pay | Admitting: Family Medicine

## 2021-01-09 VITALS — BP 139/85 | HR 82 | Temp 97.9°F | Ht 76.0 in | Wt >= 6400 oz

## 2021-01-09 DIAGNOSIS — Z09 Encounter for follow-up examination after completed treatment for conditions other than malignant neoplasm: Secondary | ICD-10-CM

## 2021-01-09 DIAGNOSIS — R2689 Other abnormalities of gait and mobility: Secondary | ICD-10-CM

## 2021-01-09 DIAGNOSIS — Z6841 Body Mass Index (BMI) 40.0 and over, adult: Secondary | ICD-10-CM

## 2021-01-09 DIAGNOSIS — R0602 Shortness of breath: Secondary | ICD-10-CM | POA: Diagnosis not present

## 2021-01-09 DIAGNOSIS — I10 Essential (primary) hypertension: Secondary | ICD-10-CM | POA: Diagnosis not present

## 2021-01-09 DIAGNOSIS — G4730 Sleep apnea, unspecified: Secondary | ICD-10-CM

## 2021-01-09 DIAGNOSIS — M7989 Other specified soft tissue disorders: Secondary | ICD-10-CM

## 2021-01-09 NOTE — Progress Notes (Signed)
[facility]   Hospital Follow Up  Subjective:  Patient ID: Jeffrey Skinner, male    DOB: Mar 27, 1956  Age: 65 y.o. MRN: [id number]  CC:  Chief Complaint  Patient presents with  . Hospitalization Follow-up    Hospital follow up     HPI Jeffrey Skinner is a 65 year old male who presents for Hospital Follow Up today.   Patient Active Problem List   Diagnosis Date Noted  . Abdominal pain 12/30/2020  . Atypical chest pain 12/30/2020  . COPD (chronic obstructive pulmonary disease) (HCC) 12/30/2020  . Obstructive sleep apnea 12/30/2020  . Essential hypertension 12/30/2020  . (HFpEF) heart failure with preserved ejection fraction (HCC) 12/30/2020  . Hypoalbuminemia 12/30/2020  . Right leg swelling 12/30/2020  . Lactic acidosis 12/30/2020  . SIRS (systemic inflammatory response syndrome) (HCC) 12/29/2020  . Anxiety 12/16/2019  . Insomnia 12/16/2019  . Class 3 severe obesity due to excess calories with serious comorbidity and body mass index (BMI) of 60.0 to 69.9 in adult (HCC) 12/16/2019  . Acute foot pain, left 07/13/2019  . Acute diastolic CHF (congestive heart failure) (HCC) 06/14/2019  . Acute respiratory failure with hypoxia and hypercapnia (HCC) 06/09/2019  . Acute on chronic respiratory failure with hypoxia (HCC) 06/09/2019  . Respiratory failure with hypoxia (HCC) 01/09/2017  . SOB (shortness of breath)   . Hypoxia    Current Status: Since his last office visit, he has had a Hospital Admission from 12/29/2020-01/02/2021. He states that he is having problems with breathing issues and has not used CPAP in over 6 months. He arrives via wheelchair. He is currently on 3 liters of continuous oxygen. He reports generalized extremity pain. He is accompanied by his son today. He denies fevers, chills, fatigue, recent infections, weight loss, and night sweats. He has not had any headaches, visual changes, dizziness, and falls. No chest pain, heart  palpitations, cough and shortness of breath reported. Denies GI problems such as nausea, vomiting, diarrhea, and constipation. He has no reports of blood in stools, dysuria and hematuria. No depression or anxiety reported today.  He is taking all medications as prescribed.  Past Medical History:  Diagnosis Date  . Acute diastolic CHF (congestive heart failure) (HCC) 06/14/2019  . Anxiety 12/16/2019  . Anxiety 11/2019  . Arthritis   . COPD (chronic obstructive pulmonary disease) (HCC) 12/30/2020  . Depression   . Dyspnea   . Hypertension   . Insomnia 11/2029  . Morbid obesity with BMI of 60.0-69.9, adult (HCC)   . Sleep apnea   . Vitamin D deficiency 06/2019     Past Surgical History:  Procedure Laterality Date  . APPENDECTOMY    . CHOLECYSTECTOMY    . HERNIA REPAIR    . VASECTOMY  1998    Family History  Problem Relation Age of Onset  . Rheum arthritis Mother   . Arthritis Sister   . Cancer - Lung Brother   . Heart disease Brother     Social History   Socioeconomic History  . Marital status: Married    Spouse name: Erie Noe  . Number of children: 2  . Years of education: Not on file  . Highest education level: Not on file  Occupational History  . Not on file  Tobacco Use  . Smoking status: Never Smoker  . Smokeless tobacco: Never Used  Vaping Use  . Vaping Use: Never used  Substance and Sexual Activity  . Alcohol use:  Yes    Alcohol/week: 1.0 standard drink    Types: 1 Shots of liquor per week    Comment: - once a month  . Drug use: No  . Sexual activity: Not Currently  Other Topics Concern  . Not on file  Social History Narrative  . Not on file   Social Determinants of Health   Financial Resource Strain: Not on file  Food Insecurity: Not on file  Transportation Needs: Not on file  Physical Activity: Not on file  Stress: Not on file  Social Connections: Not on file  Intimate Partner Violence: Not on file    Outpatient Medications Prior to Visit   Medication Sig Dispense Refill  . albuterol (VENTOLIN HFA) 108 (90 Base) MCG/ACT inhaler Inhale 2 puffs into the lungs every 6 (six) hours as needed for wheezing or shortness of breath.    Marland Kitchen. amLODipine (NORVASC) 5 MG tablet Take 5 mg by mouth daily.  0  . amoxicillin-clavulanate (AUGMENTIN) 875-125 MG tablet Take 1 tablet by mouth 2 (two) times daily for 7 days. 14 tablet 0  . benzonatate (TESSALON) 100 MG capsule Take 1 capsule (100 mg total) by mouth 3 (three) times daily as needed for cough. 30 capsule 0  . fluticasone furoate-vilanterol (BREO ELLIPTA) 200-25 MCG/INH AEPB Inhale 1 puff into the lungs daily. 60 each 1  . furosemide (LASIX) 80 MG tablet Take 80 mg by mouth daily.  0  . ipratropium-albuterol (DUONEB) 0.5-2.5 (3) MG/3ML SOLN Take 3 mLs by nebulization every 6 (six) hours as needed. (Patient taking differently: Take 3 mLs by nebulization every 6 (six) hours as needed (shortness of breath).) 360 mL 1  . losartan (COZAAR) 50 MG tablet Take 1 tablet (50 mg total) by mouth daily. 30 tablet 3  . Multiple Vitamins-Minerals (MULTIVITAMIN ADULTS PO) Take 1 tablet by mouth daily.    Marland Kitchen. Respiratory Therapy Supplies (NEBULIZER) DEVI by Does not apply route.    . Vitamin D, Ergocalciferol, (DRISDOL) 1.25 MG (50000 UNIT) CAPS capsule TAKE 1 CAPSULE (50,000 UNITS TOTAL) BY MOUTH EVERY 7 (SEVEN) DAYS. 5 capsule 3  . naproxen (NAPROSYN) 500 MG tablet Take 500 mg by mouth in the morning and at bedtime. Pain & inflammation (Patient not taking: Reported on 01/09/2021)     No facility-administered medications prior to visit.    Allergies  Allergen Reactions  . Dog Epithelium    ROS Review of Systems  Constitutional: Negative.   HENT: Negative.   Eyes: Negative.   Respiratory: Negative.   Cardiovascular: Positive for leg swelling.  Gastrointestinal: Negative.   Endocrine: Negative.   Genitourinary: Negative.   Musculoskeletal: Positive for arthralgias (generalized joint pain).  Skin:  Negative.   Neurological: Positive for dizziness (occasional ) and headaches (occasional ).  Hematological: Negative.   Psychiatric/Behavioral: Negative.    Objective:    Physical Exam Vitals and nursing note reviewed.  Constitutional:      Appearance: He is obese.     Comments: Morbid obesity  HENT:     Head: Normocephalic and atraumatic.  Cardiovascular:     Rate and Rhythm: Normal rate and regular rhythm.     Pulses: Normal pulses.     Heart sounds: Normal heart sounds.  Pulmonary:     Effort: Pulmonary effort is normal.     Breath sounds: Normal breath sounds.  Abdominal:     General: Bowel sounds are normal. There is distension (obese).     Palpations: Abdomen is soft.  Musculoskeletal:  Cervical back: Normal range of motion and neck supple.     Right lower leg: Edema present.     Left lower leg: Edema present.  Skin:    General: Skin is warm and dry.  Neurological:     General: No focal deficit present.     Mental Status: He is alert and oriented to person, place, and time.     Motor: Weakness present.  Psychiatric:        Mood and Affect: Mood normal.        Behavior: Behavior normal.        Thought Content: Thought content normal.        Judgment: Judgment normal.    BP 139/85 (BP Location: Left Arm, Patient Position: Sitting, Cuff Size: Normal)   Pulse 82   Temp 97.9 F (36.6 C) (Temporal)   Ht 6\' 4"  (1.93 m)   Wt (!) 501 lb (227.3 kg)   SpO2 94% Comment: 3L  BMI 60.98 kg/m  Wt Readings from Last 3 Encounters:  01/09/21 (!) 501 lb (227.3 kg)  01/02/21 (!) 506 lb (229.5 kg)  02/03/20 (!) 500 lb (226.8 kg)    Health Maintenance Due  Topic Date Due  . Hepatitis C Screening  Never done  . COVID-19 Vaccine (1) Never done  . TETANUS/TDAP  Never done    There are no preventive care reminders to display for this patient.  Lab Results  Component Value Date   TSH 2.420 06/23/2019   Lab Results  Component Value Date   WBC 8.3 12/31/2020   HGB  11.9 (L) 12/31/2020   HCT 40.7 12/31/2020   MCV 97.6 12/31/2020   PLT 153 12/31/2020   Lab Results  Component Value Date   NA 139 12/31/2020   K 4.5 12/31/2020   CO2 36 (H) 12/31/2020   GLUCOSE 117 (H) 12/31/2020   BUN 10 12/31/2020   CREATININE 1.05 12/31/2020   BILITOT 0.8 12/31/2020   ALKPHOS 59 12/31/2020   AST 23 12/31/2020   ALT 23 12/31/2020   PROT 6.9 12/31/2020   ALBUMIN 2.6 (L) 12/31/2020   CALCIUM 8.0 (L) 12/31/2020   ANIONGAP 5 12/31/2020   Lab Results  Component Value Date   CHOL 161 06/23/2019   Lab Results  Component Value Date   HDL 39 (L) 06/23/2019   Lab Results  Component Value Date   LDLCALC 110 (H) 06/23/2019   Lab Results  Component Value Date   TRIG 58 06/23/2019   Lab Results  Component Value Date   CHOLHDL 4.1 06/23/2019   Lab Results  Component Value Date   HGBA1C 5.2 06/24/2019   HGBA1C 5.2 06/24/2019   HGBA1C 5.2 (A) 06/24/2019   HGBA1C 5.2 06/24/2019    Assessment & Plan:   1. Hypertension, unspecified type The current medical regimen is effective; blood pressure is stable at 139/85 today; continue present plan and medications as prescribed. He will continue to take medications as prescribed, to decrease high sodium intake, excessive alcohol intake, increase potassium intake, smoking cessation, and increase physical activity of at least 30 minutes of cardio activity daily. He will continue to follow Heart Healthy or DASH diet.  2. Sleep apnea, unspecified type He will inform Oxygen service to repair CPAP settings.  - For home use only DME continuous positive airway pressure (CPAP)  3. SOB (shortness of breath) Stable. No signs or symptoms of respiratory distress noted or reported.  - For home use only DME continuous positive airway pressure (CPAP)  4. Decreased mobility - For home use only DME 4 wheeled rolling walker with seat ([id number])  5. Right leg swelling - For home use only DME 4 wheeled rolling walker with seat  ([id number])  6. Morbid obesity with BMI of 60.0-69.9, adult (HCC) - For home use only DME 4 wheeled rolling walker with seat ([id number])  7. Follow up He will follow up in 6 months.   No orders of the defined types were placed in this encounter.   Orders Placed This Encounter  Procedures  . For home use only DME continuous positive airway pressure (CPAP)  . For home use only DME 4 wheeled rolling walker with seat ([id number])    Referral Orders  No referral(s) requested today    Raliegh Ip, MSN, ANE, FNP-BC [facility] [address] [place], [place] [address] [phone]- fax   Problem List Items Addressed This Visit      Other   Right leg swelling   Relevant Orders   For home use only DME 4 wheeled rolling walker with seat ([id number])   SOB (shortness of breath)   Relevant Orders   For home use only DME continuous positive airway pressure (CPAP)    Other Visit Diagnoses    Hypertension, unspecified type    -  Primary   Sleep apnea, unspecified type       Relevant Orders   For home use only DME continuous positive airway pressure (CPAP)   Decreased mobility       Relevant Orders   For home use only DME 4 wheeled rolling walker with seat ([id number])   Morbid obesity with BMI of 60.0-69.9, adult (HCC)       Relevant Orders   For home use only DME 4 wheeled rolling walker with seat ([id number])   Follow up          No orders of the defined types were placed in this encounter.   Follow-up: No follow-ups on file.    Kallie Locks, FNP

## 2021-01-10 ENCOUNTER — Encounter: Payer: Self-pay | Admitting: Family Medicine

## 2021-01-11 ENCOUNTER — Telehealth: Payer: Self-pay | Admitting: Clinical

## 2021-01-11 NOTE — Telephone Encounter (Signed)
Integrated Behavioral Health Case Management Referral Note  01/11/2021 Name: Jeffrey Skinner MRN: [id number] DOB: Sep 05, 1956 Jeffrey Skinner is a 65 y.o. year old male who sees Kallie Locks, FNP for primary care. LCSW was consulted to assess patient's needs and assist the patient with [employer] .  Interpreter: No.   Interpreter Name & Language: none  Assessment: Patient experiencing Financial constraints related to low income. He needs assistance to have a ramp installed at his home. He uses a rolling walker and is unable to navigate the stairs. CSW received referral from PCP after patient's last visit 01/09/21.  Intervention: CSW called patient today to follow up. Patient confirmed he uses a rolling walker. He has steps in the back and front of his home. The deck in the back that he uses to access his home is also in disrepair. He is need of assistance having a ramp installed at his home. He has Tree surgeon income. CSW and patient called [employer] together and LVM with their program staff requesting call back to patient to apply for assistance. CSW will plan to follow up with patient next week and assist with follow up on this issue as needed.  Review of patient status, including review of consultants reports, relevant laboratory and other test results, and collaboration with appropriate care team members and the patient's provider was performed as part of comprehensive patient evaluation and provision of services.    SDOH (Social Determinants of Health) assessments performed: No   Goals Addressed   None      Follow up Plan: 1. CSW to follow up with patient by phone next week. Will assist in follow up with [employer] or [facility]  [employer] program as needed.  Abigail Butts, LCSW [facility] [phone]

## 2021-01-11 NOTE — Telephone Encounter (Signed)
Error

## 2021-01-17 ENCOUNTER — Telehealth: Payer: Self-pay | Admitting: Clinical

## 2021-01-17 NOTE — Telephone Encounter (Signed)
Integrated Behavioral Health General Follow Up Note  01/17/2021 Name: OSTEN JANEK MRN: [id number] DOB: 12/16/1955 Dredyn AKBAR SACRA is a 65 y.o. year old male who sees Kallie Locks, FNP for primary care. LCSW was initially consulted to assess patient's needs and assist the patient with [employer] .  Interpreter: No.   Interpreter Name & Language: none  Assessment: Patient experiencing financial constraints related to low income. He needs assistance to have a ramp installed at his home. He uses a rolling walker and is unable to navigate the stairs.   Ongoing Intervention: Today CSW called patient to follow up on referral to [employer] last week. Patient reported he has not yet received follow up from them regarding ramp installation. CSW called [employer] and left another voicemail requesting call back to patient. CSW to follow and provide assistance with referral follow up as needed.  Review of patient status, including review of consultants reports, relevant laboratory and other test results, and collaboration with appropriate care team members and the patient's provider was performed as part of comprehensive patient evaluation and provision of services.     Follow up Plan: 1. CSW to follow.  Abigail Butts, LCSW [facility] [phone]

## 2021-01-26 ENCOUNTER — Telehealth: Payer: Self-pay | Admitting: Clinical

## 2021-01-26 NOTE — Telephone Encounter (Signed)
Integrated Behavioral Health General Follow Up Note  01/26/2021 Name: MAYLON SAILORS MRN: [id number] DOB: 1956/06/19 Les TOPHER BUENAVENTURA is a 65 y.o. year old male who sees Kallie Locks, FNP (Inactive) for primary care. LCSW was initially consulted to assess patient's needs and assist the patient with [employer] .  Interpreter: No.   Interpreter Name & Language: none  Assessment: Patient experiencing financial constraints related to low income. He needs assistance to have a ramp installed at his home. He uses a rolling walker and is unable to navigate the stairs.   Ongoing Intervention: Today CSW called patient to follow up on referral to [employer] [facility]) for Armed forces technical officer and ramp installation. Patient indicated today that he did receive a follow up call from [facility]. CSW available from clinic as needed.  Review of patient status, including review of consultants reports, relevant laboratory and other test results, and collaboration with appropriate care team members and the patient's provider was performed as part of comprehensive patient evaluation and provision of services.     Follow up Plan: 1. CSW available from clinic as needed.  Abigail Butts, LCSW [facility] [phone]

## 2021-04-10 ENCOUNTER — Institutional Professional Consult (permissible substitution): Payer: 59 | Admitting: Pulmonary Disease

## 2021-04-11 ENCOUNTER — Telehealth (INDEPENDENT_AMBULATORY_CARE_PROVIDER_SITE_OTHER): Payer: 59 | Admitting: Nurse Practitioner

## 2021-04-11 ENCOUNTER — Other Ambulatory Visit: Payer: Self-pay

## 2021-04-11 ENCOUNTER — Encounter: Payer: Self-pay | Admitting: Nurse Practitioner

## 2021-04-11 DIAGNOSIS — Z6841 Body Mass Index (BMI) 40.0 and over, adult: Secondary | ICD-10-CM

## 2021-04-11 DIAGNOSIS — I10 Essential (primary) hypertension: Secondary | ICD-10-CM | POA: Diagnosis not present

## 2021-04-11 DIAGNOSIS — J449 Chronic obstructive pulmonary disease, unspecified: Secondary | ICD-10-CM | POA: Diagnosis not present

## 2021-04-11 DIAGNOSIS — G4730 Sleep apnea, unspecified: Secondary | ICD-10-CM | POA: Diagnosis not present

## 2021-04-11 MED ORDER — SILDENAFIL CITRATE 100 MG PO TABS: 100.0000 mg | ORAL_TABLET | Freq: Every day | ORAL | 0 refills | Status: DC | PRN

## 2021-04-11 MED ORDER — AMLODIPINE BESYLATE 5 MG PO TABS: 5.0000 mg | ORAL_TABLET | Freq: Every day | ORAL | 3 refills | Status: DC

## 2021-04-11 MED ORDER — FUROSEMIDE 80 MG PO TABS: 40.0000 mg | ORAL_TABLET | Freq: Two times a day (BID) | ORAL | 3 refills | Status: DC

## 2021-04-11 MED ORDER — GIZMO CONDOM CATHETER MISC: 1.0000 "application " | Freq: Two times a day (BID) | 11 refills | Status: DC

## 2021-04-11 MED ORDER — BARD URINARY DRAINAGE BAG MISC: 1.0000 "application " | Freq: Two times a day (BID) | 11 refills | Status: DC

## 2021-04-11 NOTE — Patient Instructions (Signed)
Managing Your Hypertension Hypertension, also called high blood pressure, is when the force of the blood pressing against the walls of the arteries is too strong. Arteries are blood vessels that carry blood from your heart throughout your body. Hypertension forces the heart to work harder to pump blood and may cause the arteries tobecome narrow or stiff. Understanding blood pressure readings Your personal target blood pressure may vary depending on your medical conditions, your age, and other factors. A blood pressure reading includes a higher number over a lower number. Ideally, your blood pressure should be below 120/80. You should know that: The first, or top, number is called the systolic pressure. It is a measure of the pressure in your arteries as your heart beats. The second, or bottom number, is called the diastolic pressure. It is a measure of the pressure in your arteries as the heart relaxes. Blood pressure is classified into four stages. Based on your blood pressure reading, your health care provider may use the following stages to determine what type of treatment you need, if any. Systolic pressure and diastolicpressure are measured in a unit called mmHg. Normal Systolic pressure: below 120. Diastolic pressure: below 80. Elevated Systolic pressure: 120-129. Diastolic pressure: below 80. Hypertension stage 1 Systolic pressure: 130-139. Diastolic pressure: 80-89. Hypertension stage 2 Systolic pressure: 140 or above. Diastolic pressure: 90 or above. How can this condition affect me? Managing your hypertension is an important responsibility. Over time, hypertension can damage the arteries and decrease blood flow to important parts of the body, including the brain, heart, and kidneys. Having untreated or uncontrolled hypertension can lead to: A heart attack. A stroke. A weakened blood vessel (aneurysm). Heart failure. Kidney damage. Eye damage. Metabolic syndrome. Memory and  concentration problems. Vascular dementia. What actions can I take to manage this condition? Hypertension can be managed by making lifestyle changes and possibly by taking medicines. Your health care provider will help you make a plan to bring yourblood pressure within a normal range. Nutrition  Eat a diet that is high in fiber and potassium, and low in salt (sodium), added sugar, and fat. An example eating plan is called the Dietary Approaches to Stop Hypertension (DASH) diet. To eat this way: Eat plenty of fresh fruits and vegetables. Try to fill one-half of your plate at each meal with fruits and vegetables. Eat whole grains, such as whole-wheat pasta, brown rice, or whole-grain bread. Fill about one-fourth of your plate with whole grains. Eat low-fat dairy products. Avoid fatty cuts of meat, processed or cured meats, and poultry with skin. Fill about one-fourth of your plate with lean proteins such as fish, chicken without skin, beans, eggs, and tofu. Avoid pre-made and processed foods. These tend to be higher in sodium, added sugar, and fat. Reduce your daily sodium intake. Most people with hypertension should eat less than 1,500 mg of sodium a day.  Lifestyle  Work with your health care provider to maintain a healthy body weight or to lose weight. Ask what an ideal weight is for you. Get at least 30 minutes of exercise that causes your heart to beat faster (aerobic exercise) most days of the week. Activities may include walking, swimming, or biking. Include exercise to strengthen your muscles (resistance exercise), such as weight lifting, as part of your weekly exercise routine. Try to do these types of exercises for 30 minutes at least 3 days a week. Do not use any products that contain nicotine or tobacco, such as cigarettes, e-cigarettes, and chewing   tobacco. If you need help quitting, ask your health care provider. Control any long-term (chronic) conditions you have, such as high  cholesterol or diabetes. Identify your sources of stress and find ways to manage stress. This may include meditation, deep breathing, or making time for fun activities.  Alcohol use Do not drink alcohol if: Your health care provider tells you not to drink. You are pregnant, may be pregnant, or are planning to become pregnant. If you drink alcohol: Limit how much you use to: 0-1 drink a day for women. 0-2 drinks a day for men. Be aware of how much alcohol is in your drink. In the [place] one drink equals one 12 oz bottle of beer (355 mL), one 5 oz glass of wine (148 mL), or one 1 oz glass of hard liquor (44 mL). Medicines Your health care provider may prescribe medicine if lifestyle changes are not enough to get your blood pressure under control and if: Your systolic blood pressure is 130 or higher. Your diastolic blood pressure is 80 or higher. Take medicines only as told by your health care provider. Follow the directions carefully. Blood pressure medicines must be taken as told by your health care provider. The medicine does not work as well when you skip doses. Skippingdoses also puts you at risk for problems. Monitoring Before you monitor your blood pressure: Do not smoke, drink caffeinated beverages, or exercise within 30 minutes before taking a measurement. Use the bathroom and empty your bladder (urinate). Sit quietly for at least 5 minutes before taking measurements. Monitor your blood pressure at home as told by your health care provider. To do this: Sit with your back straight and supported. Place your feet flat on the floor. Do not cross your legs. Support your arm on a flat surface, such as a table. Make sure your upper arm is at heart level. Each time you measure, take two or three readings one minute apart and record the results. You may also need to have your blood pressure checked regularly by your healthcare provider. General information Talk with your health care  provider about your diet, exercise habits, and other lifestyle factors that may be contributing to hypertension. Review all the medicines you take with your health care provider because there may be side effects or interactions. Keep all visits as told by your health care provider. Your health care provider can help you create and adjust your plan for managing your high blood pressure. Where to find more information National Heart, Lung, and Blood Institute: www.nhlbi.nih.gov American Heart Association: www.heart.org Contact a health care provider if: You think you are having a reaction to medicines you have taken. You have repeated (recurrent) headaches. You feel dizzy. You have swelling in your ankles. You have trouble with your vision. Get help right away if: You develop a severe headache or confusion. You have unusual weakness or numbness, or you feel faint. You have severe pain in your chest or abdomen. You vomit repeatedly. You have trouble breathing. These symptoms may represent a serious problem that is an emergency. Do not wait to see if the symptoms will go away. Get medical help right away. Call your local emergency services (911 in the [place] Do not drive yourself to the hospital. Summary Hypertension is when the force of blood pumping through your arteries is too strong. If this condition is not controlled, it may put you at risk for serious complications. Your personal target blood pressure may vary depending on your medical conditions,   your age, and other factors. For most people, a normal blood pressure is less than 120/80. Hypertension is managed by lifestyle changes, medicines, or both. Lifestyle changes to help manage hypertension include losing weight, eating a healthy, low-sodium diet, exercising more, stopping smoking, and limiting alcohol. This information is not intended to replace advice given to you by your health care provider. Make sure you discuss any questions  you have with your healthcare provider. Document Revised: 10/22/2019 Document Reviewed: 08/17/2019 Elsevier Patient Education  2022 Elsevier Inc.  

## 2021-04-11 NOTE — Progress Notes (Signed)
[facility] Patient [facility] [address] [place], [place]  [address] Phone:  [phone]   Fax:  [phone] Virtual Visit via Telephone Note  I connected with Jeffrey Skinner on 04/11/21 at  2:00 PM EDT by telephone and verified that I am speaking with the correct person using two identifiers.   I discussed the limitations, risks, security and privacy concerns of performing an evaluation and management service by telephone and the availability of in person appointments. I also discussed with the patient that there may be a patient responsible charge related to this service. The patient expressed understanding and agreed to proceed.  Patient home Provider Office  History of Present Illness:  Jeffrey Skinner  has a past medical history of Acute diastolic CHF (congestive heart failure) (HCC) (06/14/2019), Anxiety (12/16/2019), Anxiety (11/2019), Arthritis, COPD (chronic obstructive pulmonary disease) (HCC) (12/30/2020), Depression, Dyspnea, Hypertension, Insomnia (11/2029), Morbid obesity with BMI of 60.0-69.9, adult [facility]), Sleep apnea, and Vitamin D deficiency (06/2019).  Hypertension Patient is here for follow-up of elevated blood pressure. He is not exercising and is adherent to a low-salt diet. Blood pressure is not monitored home. Cardiac symptoms: chest pain, dyspnea, lower extremity edema, and palpitations. Patient denies syncope. Cardiovascular risk factors: advanced age (older than 84 for men, 65 for women), hypertension, male gender, obesity (BMI >= 30 kg/m2), and sedentary lifestyle. Use of agents associated with hypertension: NSAIDS and inhaler . History of target organ damage: heart failure. He reports that he could hear his heart rate while he was laying down. He is taking furosemide 40 mg BID last taken on yesterday and Amlodipine 10 mg 1/2 tab daily. He does not take his medication daily.  He reports that last week he took 3 days off from work. This helped him to feel better.   He  has sleep apnea. CPAP is not longer working. He is not longer using the BREO inhaler due to the inhaler no longer working.    Review of Systems  Genitourinary:        Urinary incontinence     Observations/Objective: No exam; telephone visit. He is able to speak in full sentences without   Assessment and Plan: Assessment  Primary Diagnosis & Pertinent Problem List: The primary encounter diagnosis was Hypertension, unspecified type. Diagnoses of Sleep apnea, unspecified type, Class 3 severe obesity due to excess calories with serious comorbidity and body mass index (BMI) of 60.0 to 69.9 in adult [facility]), and Chronic obstructive pulmonary disease, unspecified COPD type (HCC) were also pertinent to this visit.  Visit Diagnosis: 1. Hypertension, unspecified type  Unable to assess due to virtual visit and no home monitor  2. Sleep apnea, unspecified type  Persistent will follow up with pulmonology   3. Class 3 severe obesity due to excess calories with serious comorbidity and body mass index (BMI) of 60.0 to 69.9 in adult [facility])  Obesity with BMI and comorbidities as noted above.  Discussed proper diet (low fat, low sodium, high fiber) with patient.   Discussed need for regular exercise (3 times per week, 20 minutes per session) with patient.   4. Chronic obstructive pulmonary disease, unspecified COPD type (HCC)  Persistent will follow up with pulmonology    Follow Up Instructions: 3 months   I discussed the assessment and treatment plan with the patient. The patient was provided an opportunity to ask questions and all were answered. The patient agreed with the plan and demonstrated an understanding of the instructions.  The patient was advised to call back or seek an in-person evaluation if the symptoms worsen or if the condition fails to improve as anticipated.  I provided 45 minutes of telephone- visit time during this encounter.   Barbette Merino, NP

## 2021-04-13 ENCOUNTER — Telehealth: Payer: Self-pay | Admitting: Clinical

## 2021-04-13 NOTE — Telephone Encounter (Signed)
[facility] General Follow Up Note  04/13/2021 Name: Jeffrey Skinner MRN: [id number] DOB: May 01, 1956 Jeffrey Skinner is a 65 y.o. year old male who sees Barbette Merino, NP for primary care. LCSW was initially consulted to assess patient's needs and assist the patient with [employer] .  Interpreter: No.   Interpreter Name & Language: none  Assessment: Patient experiencing financial constraints related to low income. He needs assistance to have a ramp installed at his home. He uses a rolling walker and is unable to navigate the stairs.   Ongoing Intervention: Today CSW called patient to follow up on referral to [facility]) for Armed forces technical officer and ramp installation. Patient reported that he has received a call from [facility] back when referral was made in April, and they indicated they would also send a letter for follow up. He never received the letter or other follow up. CSW and patient called [place] [facility] [facility]) together today; however they do not do home rehabilitation for [name] residents. [facility] suggested the city of [place] housing rehab program; CSW and patient called this program and LVM with staff. CSW also called and LVM with [facility] requesting call back to follow up on patient's initial referral.   Review of patient status, including review of consultants reports, relevant laboratory and other test results, and collaboration with appropriate care team members and the patient's provider was performed as part of comprehensive patient evaluation and provision of services.    Abigail Butts, LCSW [facility] [phone]

## 2021-04-14 ENCOUNTER — Emergency Department (HOSPITAL_COMMUNITY): Payer: 59

## 2021-04-14 ENCOUNTER — Emergency Department (HOSPITAL_COMMUNITY)
Admission: EM | Admit: 2021-04-14 | Discharge: 2021-04-15 | Disposition: A | Discharge status: Home / Self Care | Payer: 59 | Attending: Emergency Medicine | Admitting: Emergency Medicine

## 2021-04-14 DIAGNOSIS — I110 Hypertensive heart disease with heart failure: Secondary | ICD-10-CM | POA: Insufficient documentation

## 2021-04-14 DIAGNOSIS — N451 Epididymitis: Secondary | ICD-10-CM | POA: Diagnosis not present

## 2021-04-14 DIAGNOSIS — Z7951 Long term (current) use of inhaled steroids: Secondary | ICD-10-CM | POA: Diagnosis not present

## 2021-04-14 DIAGNOSIS — I5031 Acute diastolic (congestive) heart failure: Secondary | ICD-10-CM | POA: Insufficient documentation

## 2021-04-14 DIAGNOSIS — Z79899 Other long term (current) drug therapy: Secondary | ICD-10-CM | POA: Diagnosis not present

## 2021-04-14 DIAGNOSIS — J449 Chronic obstructive pulmonary disease, unspecified: Secondary | ICD-10-CM | POA: Insufficient documentation

## 2021-04-14 DIAGNOSIS — N5082 Scrotal pain: Secondary | ICD-10-CM | POA: Diagnosis present

## 2021-04-14 MED ORDER — FENTANYL CITRATE (PF) 100 MCG/2ML IJ SOLN
100.0000 ug | Freq: Once | INTRAMUSCULAR | Status: DC
  Filled 2021-04-14: qty 2

## 2021-04-14 NOTE — ED Triage Notes (Signed)
Pt arrived via [facility] from home. Per EMS, pt c/o increased swelling in lower extremities and testicles x2-3 days. Pt requested to be transported to ED d/t the increase in pain from swelling of testicles. Hx CHF , takes lasix, and is on home O2 @ 3 lpm.

## 2021-04-14 NOTE — ED Provider Notes (Signed)
[facility] EMERGENCY DEPARTMENT Provider Note   CSN: [id number] Arrival date & time: 04/14/21  2218     History Chief Complaint  Patient presents with   Groin Swelling    Jeffrey Skinner is a 65 y.o. male with a history of COPD, acute diastolic CHF, morbid obesity, HTN who presents to the emergency department by EMS from home with a chief complaint of scrotal pain and swelling.  The patient improved endorses constant, worsening scrotal pain and swelling over the last 3 days.  As his symptoms have worsened, he has had increasing pain with walking.  No known aggravating or alleviating factors.  He is unable to characterize the pain.  He walks with a walker at home.  He denies fever, chills, abdominal pain, difficulty urinating, dysuria, leg swelling, shortness of breath, chest pain, redness, warmth, rectal pain.  No history of similar.  No recent trauma or injury to the scrotal region.  The history is provided by medical records and the patient. No language interpreter was used.      Past Medical History:  Diagnosis Date   Acute diastolic CHF (congestive heart failure) (HCC) 06/14/2019   Anxiety 12/16/2019   Anxiety 11/2019   Arthritis    COPD (chronic obstructive pulmonary disease) (HCC) 12/30/2020   Depression    Dyspnea    Hypertension    Insomnia 11/2029   Morbid obesity with BMI of 60.0-69.9, adult (HCC)    Sleep apnea    Vitamin D deficiency 06/2019    Patient Active Problem List   Diagnosis Date Noted   Abdominal pain 12/30/2020   Atypical chest pain 12/30/2020   COPD (chronic obstructive pulmonary disease) (HCC) 12/30/2020   Obstructive sleep apnea 12/30/2020   Essential hypertension 12/30/2020   (HFpEF) heart failure with preserved ejection fraction (HCC) 12/30/2020   Hypoalbuminemia 12/30/2020   Right leg swelling 12/30/2020   Lactic acidosis 12/30/2020   SIRS (systemic inflammatory response syndrome) (HCC) 12/29/2020   Anxiety 12/16/2019    Insomnia 12/16/2019   Class 3 severe obesity due to excess calories with serious comorbidity and body mass index (BMI) of 60.0 to 69.9 in adult (HCC) 12/16/2019   Acute foot pain, left 07/13/2019   Acute diastolic CHF (congestive heart failure) (HCC) 06/14/2019   Acute respiratory failure with hypoxia and hypercapnia (HCC) 06/09/2019   Acute on chronic respiratory failure with hypoxia (HCC) 06/09/2019   Respiratory failure with hypoxia (HCC) 01/09/2017   SOB (shortness of breath)    Hypoxia     Past Surgical History:  Procedure Laterality Date   APPENDECTOMY     CHOLECYSTECTOMY     HERNIA REPAIR     VASECTOMY  1998       Family History  Problem Relation Age of Onset   Rheum arthritis Mother    Arthritis Sister    Cancer - Lung Brother    Heart disease Brother     Social History   Tobacco Use   Smoking status: Never   Smokeless tobacco: Never  Vaping Use   Vaping Use: Never used  Substance Use Topics   Alcohol use: Yes    Alcohol/week: 1.0 standard drink    Types: 1 Shots of liquor per week    Comment: - once a month   Drug use: No    Home Medications Prior to Admission medications   Medication Sig Start Date End Date Taking? Authorizing Provider  albuterol (VENTOLIN HFA) 108 (90 Base) MCG/ACT inhaler Inhale 2 puffs into the  lungs every 6 (six) hours as needed for wheezing or shortness of breath.    [provider]  amLODipine (NORVASC) 5 MG tablet Take 1 tablet (5 mg total) by mouth daily. 04/11/21 04/11/22  Barbette Merino, NP  benzonatate (TESSALON) 100 MG capsule Take 1 capsule (100 mg total) by mouth 3 (three) times daily as needed for cough. 01/02/21   Rai, Delene Ruffini, MD  Catheters (GIZMO CONDOM CATHETER) MISC 1 application by Does not apply route in the morning and at bedtime. 04/11/21 04/11/22  Barbette Merino, NP  fluticasone furoate-vilanterol (BREO ELLIPTA) 200-25 MCG/INH AEPB Inhale 1 puff into the lungs daily. 01/02/21   Rai, Delene Ruffini, MD   furosemide (LASIX) 80 MG tablet Take 0.5 tablets (40 mg total) by mouth 2 (two) times daily. 04/11/21 04/11/22  Barbette Merino, NP  HYDROcodone-acetaminophen (NORCO/VICODIN) 5-325 MG tablet Take 1 tablet by mouth every 6 (six) hours as needed. 02/08/21   [provider]  Incontinence Supplies (BARD URINARY DRAINAGE BAG) MISC 1 application by Does not apply route in the morning and at bedtime. 04/11/21   Barbette Merino, NP  ipratropium-albuterol (DUONEB) 0.5-2.5 (3) MG/3ML SOLN Take 3 mLs by nebulization every 6 (six) hours as needed. Patient taking differently: Take 3 mLs by nebulization every 6 (six) hours as needed (shortness of breath). 06/24/19   Kallie Locks, FNP  levofloxacin (LEVAQUIN) 500 MG tablet Take 1 tablet (500 mg total) by mouth daily for 10 days. 04/15/21 04/25/21  Mahiya Kercheval A, PA-C  lisinopril (ZESTRIL) 5 MG tablet Take by mouth. 04/18/20   [provider]  losartan (COZAAR) 50 MG tablet Take 1 tablet (50 mg total) by mouth daily. 01/02/21   Rai, Delene Ruffini, MD  Multiple Vitamins-Minerals (MULTIVITAMIN ADULTS PO) Take 1 tablet by mouth daily. Patient not taking: Reported on 04/11/2021    [provider]  naproxen (NAPROSYN) 500 MG tablet Take 500 mg by mouth in the morning and at bedtime. Pain & inflammation Patient not taking: No sig reported 02/16/20   [provider]  Respiratory Therapy Supplies (NEBULIZER) DEVI by Does not apply route.    [provider]  sildenafil (VIAGRA) 100 MG tablet Take 1 tablet (100 mg total) by mouth daily as needed for erectile dysfunction. 04/11/21   Barbette Merino, NP  traMADol (ULTRAM) 50 MG tablet Take 1 tablet (50 mg total) by mouth every 6 (six) hours as needed. 04/15/21   Breah Joa A, PA-C  Vitamin D, Ergocalciferol, (DRISDOL) 1.25 MG (50000 UNIT) CAPS capsule TAKE 1 CAPSULE (50,000 UNITS TOTAL) BY MOUTH EVERY 7 (SEVEN) DAYS. Patient not taking: Reported on 04/11/2021 11/08/19   Kallie Locks,  FNP    Allergies    Dog epithelium  Review of Systems   Review of Systems  Constitutional:  Negative for appetite change, chills and fever.  HENT:  Negative for congestion and sore throat.   Respiratory:  Negative for shortness of breath.   Cardiovascular:  Negative for chest pain and palpitations.  Gastrointestinal:  Negative for abdominal pain, constipation, diarrhea, nausea and vomiting.  Genitourinary:  Positive for scrotal swelling and testicular pain. Negative for decreased urine volume, dysuria, flank pain, frequency, hematuria, penile discharge, penile pain and penile swelling.  Musculoskeletal:  Negative for back pain, gait problem, myalgias and neck pain.  Skin:  Negative for rash.  Allergic/Immunologic: Negative for immunocompromised state.  Neurological:  Negative for dizziness, seizures, syncope, weakness, light-headedness, numbness and headaches.  Psychiatric/Behavioral:  Negative  for confusion.    Physical Exam Updated Vital Signs BP (!) 134/104 (BP Location: Right Arm)   Pulse 85   Temp 98 F (36.7 C) (Oral)   Resp 15   SpO2 98%   Physical Exam Vitals and nursing note reviewed.  Constitutional:      General: He is not in acute distress.    Appearance: He is well-developed. He is not ill-appearing, toxic-appearing or diaphoretic.  HENT:     Head: Normocephalic.  Eyes:     Conjunctiva/sclera: Conjunctivae normal.  Cardiovascular:     Rate and Rhythm: Normal rate and regular rhythm.     Heart sounds: No murmur heard. Pulmonary:     Effort: Pulmonary effort is normal. No respiratory distress.     Breath sounds: No stridor. No wheezing, rhonchi or rales.  Chest:     Chest wall: No tenderness.  Abdominal:     General: There is no distension.     Palpations: Abdomen is soft. There is no mass.     Tenderness: There is no abdominal tenderness. There is no right CVA tenderness, left CVA tenderness, guarding or rebound.     Hernia: No hernia is present.   Genitourinary:    Penis: No phimosis or paraphimosis.      Testes:        Right: Tenderness and swelling present. Mass not present.        Left: Tenderness and swelling present. Mass not present.     Comments: Chaperoned exam.  No perineal pain. Musculoskeletal:     Cervical back: Neck supple.     Right lower leg: No edema.     Left lower leg: No edema.  Skin:    General: Skin is warm and dry.     Coloration: Skin is not jaundiced.  Neurological:     Mental Status: He is alert.  Psychiatric:        Behavior: Behavior normal.    ED Results / Procedures / Treatments   Labs (all labs ordered are listed, but only abnormal results are displayed) Labs Reviewed  CBC WITH DIFFERENTIAL/PLATELET - Abnormal; Notable for the following components:      Result Value   WBC 11.0 (*)    RBC 4.16 (*)    Hemoglobin 11.7 (*)    MCHC 29.3 (*)    Neutro Abs 8.5 (*)    All other components within normal limits  COMPREHENSIVE METABOLIC PANEL - Abnormal; Notable for the following components:   Chloride 91 (*)    CO2 40 (*)    Glucose, Bld 131 (*)    Calcium 8.5 (*)    Albumin 2.7 (*)    AST 13 (*)    All other components within normal limits  BRAIN NATRIURETIC PEPTIDE    EKG None  Radiology DG Chest Portable 1 View  Result Date: 04/14/2021 CLINICAL DATA:  Lower extremity and scrotal edema for several days EXAM: PORTABLE CHEST 1 VIEW COMPARISON:  12/29/2020 FINDINGS: 2 frontal views of the chest demonstrate a stable cardiac silhouette. There is chronic central vascular congestion without airspace disease, effusion, or pneumothorax. No acute bony abnormalities. IMPRESSION: 1. Chronic central vascular congestion.  No acute airspace disease. Electronically Signed   By: Sharlet Salina M.D.   On: 04/14/2021 23:55   US SCROTUM W/DOPPLER  Result Date: 04/15/2021 CLINICAL DATA:  Scrotal swelling EXAM: SCROTAL ULTRASOUND DOPPLER ULTRASOUND OF THE TESTICLES TECHNIQUE: Complete ultrasound  examination of the testicles, epididymis, and other scrotal structures was performed. Color  and spectral Doppler ultrasound were also utilized to evaluate blood flow to the testicles. COMPARISON:  None. FINDINGS: Right testicle Measurements: 3.5 x 2.3 x 2.3 cm. No mass or microlithiasis visualized. Left testicle Measurements: 3.5 x 2.7 x 2.7 cm. No mass or microlithiasis visualized. Right epididymis: Epididymal tail is heterogeneous/enlarged and hypervascular. Left epididymis:  Normal in size and appearance. Hydrocele:  Small bilateral hydroceles. Varicocele:  None visualized. Pulsed Doppler interrogation of both testes demonstrates normal low resistance arterial and venous waveforms bilaterally. Additional comments: Scrotal wall edema, right greater than left, without discrete fluid collection/abscess. IMPRESSION: Suspected right epididymitis. Associated scrotal wall edema, without discrete fluid collection/abscess. Small bilateral hydroceles. Normal sonographic appearance of the bilateral testes. No evidence of testicular torsion. Electronically Signed   By: Charline BillsSriyesh  Krishnan M.D.   On: 04/15/2021 01:32    Procedures Procedures   Medications Ordered in ED Medications  fentaNYL (SUBLIMAZE) injection 100 mcg (100 mcg Intravenous Patient Refused/Not Given 04/15/21 0131)  levofloxacin (LEVAQUIN) tablet 500 mg (500 mg Oral Given 04/15/21 [id number])    ED Course  I have reviewed the triage vital signs and the nursing notes.  Pertinent labs & imaging results that were available during my care of the patient were reviewed by me and considered in my medical decision making (see chart for details).    MDM Rules/Calculators/A&P                          65 year old male with a history of COPD, acute diastolic CHF, morbid obesity, HTN who presents to the emergency department with 3 days of scrotal pain and swelling.  No constitutional symptoms.  Labs and imaging of been reviewed and independently interpreted by  me.  Vital signs are stable.  He has a mild leukocytosis of 11.  Bicarb is elevated at 40, but he has COPD.  He is alert and oriented x3.  Doubt he is retaining.  He also notes that he has not recently been using his CPAP at night.  No other metabolic derangements.  Scrotal ultrasound with right epididymitis. No abscess. Doubt Fournier's gangrene, paraphimosis, perirectal abscess, testicular torsion. Will start the patient on levaquin and discharge home with pain medication and urology follow up. Strict return precautions given. He is hemodynamically stable and in no acute distress. Safe for discharge to home with outpatient follow up.     Final Clinical Impression(s) / ED Diagnoses Final diagnoses:  Right epididymitis    Rx / DC Orders ED Discharge Orders          Ordered    levofloxacin (LEVAQUIN) 500 MG tablet  Daily,   Status:  Discontinued        04/15/21 0452    traMADol (ULTRAM) 50 MG tablet  Every 6 hours PRN,   Status:  Discontinued        04/15/21 0452    levofloxacin (LEVAQUIN) 500 MG tablet  Daily        04/15/21 0611    traMADol (ULTRAM) 50 MG tablet  Every 6 hours PRN        04/15/21 0611             Frederik PearMcDonald, Rexanna Louthan A, PA-C 04/15/21 [id number]    Geoffery Lyonselo, Douglas, MD 04/15/21 2353

## 2021-04-15 LAB — CBC WITH DIFFERENTIAL/PLATELET
Abs Immature Granulocytes: 0.04 10*3/uL (ref 0.00–0.07)
Basophils Absolute: 0.1 10*3/uL (ref 0.0–0.1)
Basophils Relative: 1 %
Eosinophils Absolute: 0.2 10*3/uL (ref 0.0–0.5)
Eosinophils Relative: 2 %
HCT: 40 % (ref 39.0–52.0)
Hemoglobin: 11.7 g/dL — ABNORMAL LOW (ref 13.0–17.0)
Immature Granulocytes: 0 %
Lymphocytes Relative: 12 %
Lymphs Abs: 1.3 10*3/uL (ref 0.7–4.0)
MCH: 28.1 pg (ref 26.0–34.0)
MCHC: 29.3 g/dL — ABNORMAL LOW (ref 30.0–36.0)
MCV: 96.2 fL (ref 80.0–100.0)
Monocytes Absolute: 1 10*3/uL (ref 0.1–1.0)
Monocytes Relative: 9 %
Neutro Abs: 8.5 10*3/uL — ABNORMAL HIGH (ref 1.7–7.7)
Neutrophils Relative %: 76 %
Platelets: 234 10*3/uL (ref 150–400)
RBC: 4.16 MIL/uL — ABNORMAL LOW (ref 4.22–5.81)
RDW: 14.4 % (ref 11.5–15.5)
WBC: 11 10*3/uL — ABNORMAL HIGH (ref 4.0–10.5)
nRBC: 0 % (ref 0.0–0.2)

## 2021-04-15 LAB — COMPREHENSIVE METABOLIC PANEL
ALT: 12 U/L (ref 0–44)
AST: 13 U/L — ABNORMAL LOW (ref 15–41)
Albumin: 2.7 g/dL — ABNORMAL LOW (ref 3.5–5.0)
Alkaline Phosphatase: 61 U/L (ref 38–126)
Anion gap: 6 (ref 5–15)
BUN: 8 mg/dL (ref 8–23)
CO2: 40 mmol/L — ABNORMAL HIGH (ref 22–32)
Calcium: 8.5 mg/dL — ABNORMAL LOW (ref 8.9–10.3)
Chloride: 91 mmol/L — ABNORMAL LOW (ref 98–111)
Creatinine, Ser: 0.87 mg/dL (ref 0.61–1.24)
GFR, Estimated: >60 mL/min (ref >60)
Glucose, Bld: 131 mg/dL — ABNORMAL HIGH (ref 70–99)
Potassium: 4.3 mmol/L (ref 3.5–5.1)
Sodium: 137 mmol/L (ref 135–145)
Total Bilirubin: 0.7 mg/dL (ref 0.3–1.2)
Total Protein: 6.8 g/dL (ref 6.5–8.1)

## 2021-04-15 LAB — BRAIN NATRIURETIC PEPTIDE: B Natriuretic Peptide: 11.4 pg/mL (ref 0.0–100.0)

## 2021-04-15 MED ORDER — LEVOFLOXACIN 500 MG PO TABS
500.0000 mg | ORAL_TABLET | Freq: Once | ORAL | Status: AC
  Administered 2021-04-15: 500 mg via ORAL
  Filled 2021-04-15: qty 1

## 2021-04-15 MED ORDER — LEVOFLOXACIN 500 MG PO TABS: 500.0000 mg | ORAL_TABLET | Freq: Every day | ORAL | 0 refills | Status: DC

## 2021-04-15 MED ORDER — TRAMADOL HCL 50 MG PO TABS: 50.0000 mg | ORAL_TABLET | Freq: Four times a day (QID) | ORAL | 0 refills | Status: DC | PRN

## 2021-04-15 MED ORDER — LEVOFLOXACIN 500 MG PO TABS: 500.0000 mg | ORAL_TABLET | Freq: Every day | ORAL | 0 refills | Status: AC

## 2021-04-15 NOTE — Discharge Instructions (Addendum)
Thank you for allowing me to care for you today in the Emergency Department.   Your work-up today was consistent with right-sided epididymitis that is causing the scrotal pain and swelling that you have been having over the last 3 days.  This is treated with antibiotics.  Take Levaquin 2 times daily for the next 10 days.  Your first dose was given today in the emergency department.  Please call and schedule a follow-up appointment with urology.  Their office is listed above.  You can take 1 tablet of tramadol for severe pain every 6 hours as needed.  Return to the emergency department if you develop significantly worsening pain, abdominal pain, if you start having fevers, temperature greater than 100.4 F, or other new, concerning symptoms

## 2021-04-15 NOTE — ED Notes (Signed)
[facility] CALLED  °

## 2021-04-16 ENCOUNTER — Telehealth: Payer: Self-pay | Admitting: Nurse Practitioner

## 2021-04-16 ENCOUNTER — Other Ambulatory Visit: Payer: Self-pay | Admitting: Nurse Practitioner

## 2021-04-16 ENCOUNTER — Telehealth: Payer: Self-pay | Admitting: Clinical

## 2021-04-16 MED ORDER — GIZMO CONDOM CATHETER MISC: 1.0000 "application " | Freq: Two times a day (BID) | 11 refills | Status: AC

## 2021-04-16 NOTE — Telephone Encounter (Signed)
   Pharmacy calling to 1) request size of the Gizmo Catheters. (Comes in Medium and Large. Catheters (GIZMO CONDOM CATHETER) MISC [[id number]]   AND 2) to inform that the Urinary Bag item # [id number] is preffered by the [facility] Pharmacy wants to know if this is approved by Provider.    [facility] - [place], [place] - [address] [facility]  [address] [place] [address]  Phone:  [phone]  Fax:  [phone]

## 2021-04-16 NOTE — Telephone Encounter (Signed)
[facility] General Follow Up Note  04/16/2021 Name: JAZ MALLICK MRN: [id number] DOB: 1955/11/07 Maxx ROYALTY DOMAGALA is a 65 y.o. year old male who sees Barbette Merino, NP for primary care. LCSW was initially consulted to assess patient's needs and assist the patient with [employer] .  Interpreter: No.   Interpreter Name & Language: none  Assessment: Patient experiencing financial constraints related to low income. He needs assistance to have a ramp installed at his home. He uses a rolling walker and is unable to navigate the stairs.   Ongoing Intervention: Today CSW received a call back from [employer] [facility]) in regards to request for assistance for patient to have ramp installed and deck repaired at his home. [facility] indicated they had requested some proof of income for the household members and a utility bill to complete his application, but patient had not yet provided the documents. CSW called patient and advised him of this. Advised that CSW can assist in sending these documents to [facility]. Patient indicated he planned to collect the documents and then reach out to CSW for help getting them to [facility].  Review of patient status, including review of consultants reports, relevant laboratory and other test results, and collaboration with appropriate care team members and the patient's provider was performed as part of comprehensive patient evaluation and provision of services.    Abigail Butts, LCSW [facility] [phone]

## 2021-07-16 ENCOUNTER — Telehealth: Payer: Self-pay | Admitting: Clinical

## 2021-07-16 ENCOUNTER — Ambulatory Visit: Payer: 59 | Admitting: Nurse Practitioner

## 2021-07-16 NOTE — Telephone Encounter (Signed)
[facility]al [facility] General Follow Up Note  07/16/2021 Name: Jeffrey Skinner MRN: [id number] DOB: Oct 26, 1955 Jeffrey Skinner is a 65 y.o. year old male who sees Barbette Merino, NP for primary care. LCSW was initially consulted to assess patient's needs and assist the patient with [employer] .  Interpreter: No.   Interpreter Name & Language: none  Assessment: Patient experiencing financial constraints related to low income. He needs assistance to have a ramp installed at his home. He uses a rolling walker and is unable to navigate the stairs.   Ongoing Intervention: Patient called CSW and reported he needs assistance with the application for deck repair/ramp installation at his home. CSW had previously referred patient to [employer] for this and they requested proof of income documents.   He reported that he can't remember things well and so hasn't been able to collect the needed documents or submit them. Patient lives with his wife and their son helps them from time to time. Patient consented for CSW to speak with his son to help in coordinating this and advised his son will call CSW to follow up.  Review of patient status, including review of consultants reports, relevant laboratory and other test results, and collaboration with appropriate care team members and the patient's provider was performed as part of comprehensive patient evaluation and provision of services.    Abigail Butts, LCSW Patient Care Center [facility] [phone]

## 2021-08-28 ENCOUNTER — Telehealth: Payer: Self-pay | Admitting: Clinical

## 2021-08-28 NOTE — Telephone Encounter (Signed)
[facility]ated [facility]al [facility] General Follow Up Note  08/28/2021 Name: HYMAN CROSSAN MRN: [id number] DOB: 09/02/1956 Toran PENN GRISSETT is a 65 y.o. year old male who sees Barbette Merino, NP for primary care. LCSW was initially consulted to assess patient's needs and assist the patient with [employer] .  Interpreter: No.   Interpreter Name & Language: none  Assessment: Patient experiencing financial constraints related to low income. He needs assistance to have a ramp installed at his home. He uses a rolling walker and is unable to navigate the stairs.   Ongoing Intervention: Patient called CSW and indicated he will obtain the supporting documents needed for [employer] [facility]) application for assistance with a ramp and deck repair at his home. CSW had previously referred patient to [facility] for this and they requested proof of income documents. Patient has not yet collected and provided these documents. CSW available for assistance.  Review of patient status, including review of consultants reports, relevant laboratory and other test results, and collaboration with appropriate care team members and the patient's provider was performed as part of comprehensive patient evaluation and provision of services.    Abigail Butts, LCSW Patient Care Center [facility] [phone]

## 2022-01-01 ENCOUNTER — Emergency Department (HOSPITAL_COMMUNITY)
Admission: EM | Admit: 2022-01-01 | Discharge: 2022-01-02 | Disposition: A | Discharge status: Home / Self Care | Payer: 59 | Attending: Emergency Medicine | Admitting: Emergency Medicine

## 2022-01-01 ENCOUNTER — Other Ambulatory Visit: Payer: Self-pay

## 2022-01-01 ENCOUNTER — Encounter (HOSPITAL_COMMUNITY): Payer: Self-pay

## 2022-01-01 ENCOUNTER — Emergency Department (HOSPITAL_COMMUNITY): Payer: 59

## 2022-01-01 DIAGNOSIS — R059 Cough, unspecified: Secondary | ICD-10-CM | POA: Insufficient documentation

## 2022-01-01 DIAGNOSIS — R0602 Shortness of breath: Secondary | ICD-10-CM | POA: Insufficient documentation

## 2022-01-01 DIAGNOSIS — R600 Localized edema: Secondary | ICD-10-CM | POA: Insufficient documentation

## 2022-01-01 DIAGNOSIS — R062 Wheezing: Secondary | ICD-10-CM | POA: Insufficient documentation

## 2022-01-01 LAB — CBC
HCT: 45.1 % (ref 39.0–52.0)
Hemoglobin: 13 g/dL (ref 13.0–17.0)
MCH: 28.4 pg (ref 26.0–34.0)
MCHC: 28.8 g/dL — ABNORMAL LOW (ref 30.0–36.0)
MCV: 98.7 fL (ref 80.0–100.0)
Platelets: 193 10*3/uL (ref 150–400)
RBC: 4.57 MIL/uL (ref 4.22–5.81)
RDW: 13.8 % (ref 11.5–15.5)
WBC: 8.1 10*3/uL (ref 4.0–10.5)
nRBC: 0 % (ref 0.0–0.2)

## 2022-01-01 LAB — BASIC METABOLIC PANEL
Anion gap: 7 (ref 5–15)
BUN: 7 mg/dL — ABNORMAL LOW (ref 8–23)
CO2: 38 mmol/L — ABNORMAL HIGH (ref 22–32)
Calcium: 8.7 mg/dL — ABNORMAL LOW (ref 8.9–10.3)
Chloride: 95 mmol/L — ABNORMAL LOW (ref 98–111)
Creatinine, Ser: 0.9 mg/dL (ref 0.61–1.24)
GFR, Estimated: >60 mL/min (ref >60)
Glucose, Bld: 137 mg/dL — ABNORMAL HIGH (ref 70–99)
Potassium: 4.1 mmol/L (ref 3.5–5.1)
Sodium: 140 mmol/L (ref 135–145)

## 2022-01-01 MED ORDER — ALBUTEROL SULFATE HFA 108 (90 BASE) MCG/ACT IN AERS: 2.0000 | INHALATION_SPRAY | RESPIRATORY_TRACT | Status: DC | PRN

## 2022-01-01 NOTE — ED Triage Notes (Signed)
Pt arrives to ED BIB [facility] c/o [facility] that has been going on all day. Pt was at Urgent Care and sent here for further evaluation.  ? ?BP 140 Palpated ?HR 90 ?O2 91 RA ?

## 2022-01-02 LAB — TROPONIN I (HIGH SENSITIVITY): Troponin I (High Sensitivity): 5 ng/L (ref <18)

## 2022-01-02 LAB — BRAIN NATRIURETIC PEPTIDE: B Natriuretic Peptide: 13.5 pg/mL (ref 0.0–100.0)

## 2022-01-02 MED ORDER — ALBUTEROL SULFATE HFA 108 (90 BASE) MCG/ACT IN AERS
4.0000 | INHALATION_SPRAY | Freq: Once | RESPIRATORY_TRACT | Status: AC
  Administered 2022-01-02: 4 via RESPIRATORY_TRACT
  Filled 2022-01-02: qty 6.7

## 2022-01-02 MED ORDER — AEROCHAMBER PLUS FLO-VU LARGE MISC
1.0000 | Freq: Once | Status: AC
Start: 2022-01-02 — End: 2022-01-02
  Administered 2022-01-02: 1

## 2022-01-02 MED ORDER — PREDNISONE 20 MG PO TABS
60.0000 mg | ORAL_TABLET | Freq: Once | ORAL | Status: AC
  Administered 2022-01-02: 60 mg via ORAL
  Filled 2022-01-02: qty 3

## 2022-01-02 MED ORDER — IPRATROPIUM-ALBUTEROL 0.5-2.5 (3) MG/3ML IN SOLN: 3.0000 mL | Freq: Four times a day (QID) | RESPIRATORY_TRACT | 1 refills | Status: AC | PRN

## 2022-01-02 MED ORDER — PREDNISONE 20 MG PO TABS: ORAL_TABLET | ORAL | 0 refills | Status: DC

## 2022-01-02 MED ORDER — IPRATROPIUM-ALBUTEROL 0.5-2.5 (3) MG/3ML IN SOLN
3.0000 mL | RESPIRATORY_TRACT | Status: AC
  Administered 2022-01-02 (×3): 3 mL via RESPIRATORY_TRACT
  Filled 2022-01-02: qty 6
  Filled 2022-01-02: qty 3

## 2022-01-02 NOTE — ED Provider Notes (Signed)
?[facility] ?Provider Note ? ? ?CSN: [id number] ?Arrival date & time: 01/01/22  2016 ? ?  ? ?History ? ?Chief Complaint  ?Patient presents with  ? Shortness of Breath  ? ? ?Jeffrey Skinner is a 66 y.o. male. ? ?The history is provided by the patient.  ?Shortness of Breath ?Severity:  Mild ?Onset quality:  Gradual ?Duration:  2 weeks ?Timing:  Intermittent ?Progression:  Worsening ?Chronicity:  Recurrent ?Context: activity and known allergens   ?Relieved by:  None tried ?Worsened by:  Nothing ?Ineffective treatments:  Inhaler ?Associated symptoms: cough, sputum production and wheezing   ?Associated symptoms: no abdominal pain and no fever   ? ?  ? ?Home Medications ?Prior to Admission medications   ?Medication Sig Start Date End Date Taking? Authorizing Provider  ?predniSONE (DELTASONE) 20 MG tablet 3 tabs po day one, then 2 po daily x 4 days 01/02/22  Yes Zamirah Denny, Corene Cornea, MD  ?albuterol (VENTOLIN HFA) 108 (90 Base) MCG/ACT inhaler Inhale 2 puffs into the lungs every 6 (six) hours as needed for wheezing or shortness of breath.    [provider]  ?amLODipine (NORVASC) 5 MG tablet Take 1 tablet (5 mg total) by mouth daily. 04/11/21 04/11/22  Vevelyn Francois, NP  ?benzonatate (TESSALON) 100 MG capsule Take 1 capsule (100 mg total) by mouth 3 (three) times daily as needed for cough. 01/02/21   Rai, Vernelle Emerald, MD  ?Catheters (GIZMO CONDOM CATHETER) MISC 1 application by Does not apply route in the morning and at bedtime. 04/16/21 04/16/22  Vevelyn Francois, NP  ?fluticasone furoate-vilanterol (BREO ELLIPTA) 200-25 MCG/INH AEPB Inhale 1 puff into the lungs daily. 01/02/21   Rai, Vernelle Emerald, MD  ?furosemide (LASIX) 80 MG tablet Take 0.5 tablets (40 mg total) by mouth 2 (two) times daily. 04/11/21 04/11/22  Vevelyn Francois, NP  ?HYDROcodone-acetaminophen (NORCO/VICODIN) 5-325 MG tablet Take 1 tablet by mouth every 6 (six) hours as needed. 02/08/21   [provider]  ?Incontinence  Supplies (BARD URINARY DRAINAGE BAG) MISC 1 application by Does not apply route in the morning and at bedtime. 04/11/21   Vevelyn Francois, NP  ?ipratropium-albuterol (DUONEB) 0.5-2.5 (3) MG/3ML SOLN Take 3 mLs by nebulization every 6 (six) hours as needed (shortness of breath). 01/02/22 02/01/22  Corena Tilson, Corene Cornea, MD  ?lisinopril (ZESTRIL) 5 MG tablet Take by mouth. 04/18/20   [provider]  ?losartan (COZAAR) 50 MG tablet Take 1 tablet (50 mg total) by mouth daily. 01/02/21   Rai, Vernelle Emerald, MD  ?Multiple Vitamins-Minerals (MULTIVITAMIN ADULTS PO) Take 1 tablet by mouth daily. ?Patient not taking: Reported on 04/11/2021    [provider]  ?naproxen (NAPROSYN) 500 MG tablet Take 500 mg by mouth in the morning and at bedtime. Pain & inflammation ?Patient not taking: No sig reported 02/16/20   [provider]  ?Respiratory Therapy Supplies (NEBULIZER) DEVI by Does not apply route.    [provider]  ?sildenafil (VIAGRA) 100 MG tablet Take 1 tablet (100 mg total) by mouth daily as needed for erectile dysfunction. 04/11/21   Vevelyn Francois, NP  ?traMADol (ULTRAM) 50 MG tablet Take 1 tablet (50 mg total) by mouth every 6 (six) hours as needed. 04/15/21   McDonald, Mia A, PA-C  ?Vitamin D, Ergocalciferol, (DRISDOL) 1.25 MG (50000 UNIT) CAPS capsule TAKE 1 CAPSULE (50,000 UNITS TOTAL) BY MOUTH EVERY 7 (SEVEN) DAYS. ?Patient not taking: Reported on 04/11/2021 11/08/19   Azzie Glatter, FNP  ?   ? ?  Allergies    ?Dog epithelium   ? ?Review of Systems   ?Review of Systems  ?Constitutional:  Negative for fever.  ?Respiratory:  Positive for cough, sputum production, shortness of breath and wheezing.   ?Gastrointestinal:  Negative for abdominal pain.  ? ?Physical Exam ?Updated Vital Signs ?BP (!) 124/55   Pulse 91   Temp 97.8 ?F (36.6 ?C) (Oral)   Resp 18   SpO2 (!) 89%  ?Physical Exam ?Vitals and nursing note reviewed.  ?Constitutional:   ?   Appearance: He is well-developed.  ?HENT:  ?   Head:  Normocephalic and atraumatic.  ?Cardiovascular:  ?   Rate and Rhythm: Normal rate.  ?Pulmonary:  ?   Effort: Pulmonary effort is normal. No respiratory distress.  ?   Breath sounds: Decreased breath sounds and wheezing present.  ?Chest:  ?   Chest wall: No mass.  ?Abdominal:  ?   General: There is no distension.  ?Musculoskeletal:     ?   General: Normal range of motion.  ?   Cervical back: Normal range of motion.  ?   Right lower leg: Edema present.  ?   Left lower leg: Edema present.  ?Skin: ?   General: Skin is warm and dry.  ?Neurological:  ?   Mental Status: He is alert.  ? ? ?ED Results / Procedures / Treatments   ?Labs ?(all labs ordered are listed, but only abnormal results are displayed) ?Labs Reviewed  ?BASIC METABOLIC PANEL - Abnormal; Notable for the following components:  ?    Result Value  ? Chloride 95 (*)   ? CO2 38 (*)   ? Glucose, Bld 137 (*)   ? BUN 7 (*)   ? Calcium 8.7 (*)   ? All other components within normal limits  ?CBC - Abnormal; Notable for the following components:  ? MCHC 28.8 (*)   ? All other components within normal limits  ?BRAIN NATRIURETIC PEPTIDE  ?TROPONIN I (HIGH SENSITIVITY)  ? ? ?EKG ?None ? ?Radiology ?DG Chest 2 View ? ?Result Date: 01/01/2022 ?CLINICAL DATA:  SHOB EXAM: CHEST - 2 VIEW COMPARISON:  Chest x-ray 04/14/2021, CT chest 12/29/2020, chest x-ray 06/11/2019 FINDINGS: Stable enlarged cardiac silhouette. The heart and mediastinal contours are unchanged. Slightly less prominent hilar vasculature. No focal consolidation. No pulmonary edema. No pleural effusion. No pneumothorax. No acute osseous abnormality. IMPRESSION: Slightly improved venous vascular congestion with otherwise no active cardiopulmonary disease. Electronically Signed   By: Iven Finn M.D.   On: 01/01/2022 21:39   ? ?Procedures ?Procedures  ? ? ?Medications Ordered in ED ?Medications  ?albuterol (VENTOLIN HFA) 108 (90 Base) MCG/ACT inhaler 4 puff (has no administration in time range)  ?AeroChamber  Plus Flo-Vu Large MISC 1 each (has no administration in time range)  ?ipratropium-albuterol (DUONEB) 0.5-2.5 (3) MG/3ML nebulizer solution 3 mL (3 mLs Nebulization Given 01/02/22 0120)  ?predniSONE (DELTASONE) tablet 60 mg (60 mg Oral Given 01/02/22 0239)  ? ? ?ED Course/ Medical Decision Making/ A&P ?  ?                        ?Medical Decision Making ?Amount and/or Complexity of Data Reviewed ?Labs: ordered. ?Radiology: ordered. ? ?Risk ?Prescription drug management. ? ? ?Improved aereation with duonebs. Steroids given as well. Suspect bronchoconstriction. Doubt pulmonary edema. No e/o infectious causes. No hypoxia. No resp distress. No other indication for further workup or hospitalization at this time.  ? ?Final Clinical Impression(s) /  ED Diagnoses ?Final diagnoses:  ?Shortness of breath  ? ? ?Rx / DC Orders ?ED Discharge Orders   ? ?      Ordered  ?  predniSONE (DELTASONE) 20 MG tablet       ? 01/02/22 0346  ?  ipratropium-albuterol (DUONEB) 0.5-2.5 (3) MG/3ML SOLN  Every 6 hours PRN       ? 01/02/22 0400  ? ?  ?  ? ?  ? ? ?  ?Merrily Pew, MD ?01/02/22 0445 ? ?

## 2022-01-23 ENCOUNTER — Other Ambulatory Visit: Payer: Self-pay

## 2022-01-23 ENCOUNTER — Inpatient Hospital Stay (HOSPITAL_COMMUNITY): Payer: 59

## 2022-01-23 ENCOUNTER — Inpatient Hospital Stay (HOSPITAL_COMMUNITY)
Admission: EM | Admit: 2022-01-23 | Discharge: 2022-01-30 | DRG: 189 | Disposition: A | Discharge status: Home Health | Payer: 59 | Attending: Internal Medicine | Admitting: Internal Medicine

## 2022-01-23 ENCOUNTER — Emergency Department (HOSPITAL_COMMUNITY): Payer: 59

## 2022-01-23 DIAGNOSIS — I509 Heart failure, unspecified: Secondary | ICD-10-CM

## 2022-01-23 DIAGNOSIS — I5031 Acute diastolic (congestive) heart failure: Secondary | ICD-10-CM | POA: Diagnosis present

## 2022-01-23 DIAGNOSIS — R739 Hyperglycemia, unspecified: Secondary | ICD-10-CM | POA: Diagnosis not present

## 2022-01-23 DIAGNOSIS — Z6841 Body Mass Index (BMI) 40.0 and over, adult: Secondary | ICD-10-CM

## 2022-01-23 DIAGNOSIS — Z8249 Family history of ischemic heart disease and other diseases of the circulatory system: Secondary | ICD-10-CM | POA: Diagnosis not present

## 2022-01-23 DIAGNOSIS — G4733 Obstructive sleep apnea (adult) (pediatric): Secondary | ICD-10-CM | POA: Diagnosis not present

## 2022-01-23 DIAGNOSIS — I110 Hypertensive heart disease with heart failure: Secondary | ICD-10-CM | POA: Diagnosis present

## 2022-01-23 DIAGNOSIS — J449 Chronic obstructive pulmonary disease, unspecified: Secondary | ICD-10-CM | POA: Diagnosis present

## 2022-01-23 DIAGNOSIS — Z8261 Family history of arthritis: Secondary | ICD-10-CM | POA: Diagnosis not present

## 2022-01-23 DIAGNOSIS — Z7951 Long term (current) use of inhaled steroids: Secondary | ICD-10-CM

## 2022-01-23 DIAGNOSIS — I5030 Unspecified diastolic (congestive) heart failure: Secondary | ICD-10-CM | POA: Diagnosis present

## 2022-01-23 DIAGNOSIS — Z9981 Dependence on supplemental oxygen: Secondary | ICD-10-CM

## 2022-01-23 DIAGNOSIS — Z79899 Other long term (current) drug therapy: Secondary | ICD-10-CM

## 2022-01-23 DIAGNOSIS — J9620 Acute and chronic respiratory failure, unspecified whether with hypoxia or hypercapnia: Secondary | ICD-10-CM | POA: Diagnosis present

## 2022-01-23 DIAGNOSIS — I2781 Cor pulmonale (chronic): Secondary | ICD-10-CM | POA: Diagnosis present

## 2022-01-23 DIAGNOSIS — E559 Vitamin D deficiency, unspecified: Secondary | ICD-10-CM | POA: Diagnosis present

## 2022-01-23 DIAGNOSIS — G9341 Metabolic encephalopathy: Secondary | ICD-10-CM | POA: Diagnosis present

## 2022-01-23 DIAGNOSIS — J9622 Acute and chronic respiratory failure with hypercapnia: Secondary | ICD-10-CM | POA: Diagnosis present

## 2022-01-23 DIAGNOSIS — J9602 Acute respiratory failure with hypercapnia: Secondary | ICD-10-CM | POA: Diagnosis present

## 2022-01-23 DIAGNOSIS — E662 Morbid (severe) obesity with alveolar hypoventilation: Secondary | ICD-10-CM | POA: Diagnosis present

## 2022-01-23 DIAGNOSIS — I5033 Acute on chronic diastolic (congestive) heart failure: Secondary | ICD-10-CM | POA: Diagnosis present

## 2022-01-23 DIAGNOSIS — M1990 Unspecified osteoarthritis, unspecified site: Secondary | ICD-10-CM | POA: Diagnosis present

## 2022-01-23 DIAGNOSIS — J9621 Acute and chronic respiratory failure with hypoxia: Secondary | ICD-10-CM | POA: Diagnosis present

## 2022-01-23 LAB — CBC WITH DIFFERENTIAL/PLATELET
Abs Immature Granulocytes: 0.07 10*3/uL (ref 0.00–0.07)
Basophils Absolute: 0.1 10*3/uL (ref 0.0–0.1)
Basophils Relative: 1 %
Eosinophils Absolute: 0.1 10*3/uL (ref 0.0–0.5)
Eosinophils Relative: 1 %
HCT: 48.4 % (ref 39.0–52.0)
Hemoglobin: 13.2 g/dL (ref 13.0–17.0)
Immature Granulocytes: 1 %
Lymphocytes Relative: 8 %
Lymphs Abs: 0.7 10*3/uL (ref 0.7–4.0)
MCH: 28.9 pg (ref 26.0–34.0)
MCHC: 27.3 g/dL — ABNORMAL LOW (ref 30.0–36.0)
MCV: 105.9 fL — ABNORMAL HIGH (ref 80.0–100.0)
Monocytes Absolute: 1.1 10*3/uL — ABNORMAL HIGH (ref 0.1–1.0)
Monocytes Relative: 11 %
Neutro Abs: 7.3 10*3/uL (ref 1.7–7.7)
Neutrophils Relative %: 78 %
Platelets: 176 10*3/uL (ref 150–400)
RBC: 4.57 MIL/uL (ref 4.22–5.81)
RDW: 15.7 % — ABNORMAL HIGH (ref 11.5–15.5)
WBC: 9.4 10*3/uL (ref 4.0–10.5)
nRBC: 1 % — ABNORMAL HIGH (ref 0.0–0.2)

## 2022-01-23 LAB — URINALYSIS, ROUTINE W REFLEX MICROSCOPIC
Bilirubin Urine: NEGATIVE
Glucose, UA: NEGATIVE mg/dL
Hgb urine dipstick: NEGATIVE
Ketones, ur: 5 mg/dL — AB
Leukocytes,Ua: NEGATIVE
Nitrite: NEGATIVE
Protein, ur: 30 mg/dL — AB
Specific Gravity, Urine: 1.016 (ref 1.005–1.030)
pH: 5 (ref 5.0–8.0)

## 2022-01-23 LAB — BLOOD GAS, ARTERIAL
Acid-Base Excess: 13.5 mmol/L — ABNORMAL HIGH (ref 0.0–2.0)
Acid-Base Excess: 14.6 mmol/L — ABNORMAL HIGH (ref 0.0–2.0)
Bicarbonate: 47.4 mmol/L — ABNORMAL HIGH (ref 20.0–28.0)
Bicarbonate: 46.9 mmol/L — ABNORMAL HIGH (ref 20.0–28.0)
O2 Saturation: 98.3 %
O2 Saturation: 98.5 %
Patient temperature: 36.7
Patient temperature: 36.7
pCO2 arterial: >123 mmHg (ref 32–48)
pCO2 arterial: 106 mmHg (ref 32–48)
pH, Arterial: 7.18 — CL (ref 7.35–7.45)
pH, Arterial: 7.25 — ABNORMAL LOW (ref 7.35–7.45)
pO2, Arterial: 91 mmHg (ref 83–108)
pO2, Arterial: 90 mmHg (ref 83–108)

## 2022-01-23 LAB — COMPREHENSIVE METABOLIC PANEL
ALT: 15 U/L (ref 0–44)
AST: 23 U/L (ref 15–41)
Albumin: 3.2 g/dL — ABNORMAL LOW (ref 3.5–5.0)
Alkaline Phosphatase: 59 U/L (ref 38–126)
Anion gap: 7 (ref 5–15)
BUN: 8 mg/dL (ref 8–23)
CO2: 40 mmol/L — ABNORMAL HIGH (ref 22–32)
Calcium: 7.9 mg/dL — ABNORMAL LOW (ref 8.9–10.3)
Chloride: 94 mmol/L — ABNORMAL LOW (ref 98–111)
Creatinine, Ser: 0.94 mg/dL (ref 0.61–1.24)
GFR, Estimated: >60 mL/min (ref >60)
Glucose, Bld: 126 mg/dL — ABNORMAL HIGH (ref 70–99)
Potassium: 4.7 mmol/L (ref 3.5–5.1)
Sodium: 141 mmol/L (ref 135–145)
Total Bilirubin: 0.8 mg/dL (ref 0.3–1.2)
Total Protein: 7.7 g/dL (ref 6.5–8.1)

## 2022-01-23 LAB — HEMOGLOBIN A1C
Hgb A1c MFr Bld: 5.7 % — ABNORMAL HIGH (ref 4.8–5.6)
Mean Plasma Glucose: 116.89 mg/dL

## 2022-01-23 LAB — I-STAT VENOUS BLOOD GAS, ED
Acid-Base Excess: 15 mmol/L — ABNORMAL HIGH (ref 0.0–2.0)
Bicarbonate: 47.3 mmol/L — ABNORMAL HIGH (ref 20.0–28.0)
Calcium, Ion: 0.95 mmol/L — ABNORMAL LOW (ref 1.15–1.40)
HCT: 48 % (ref 39.0–52.0)
Hemoglobin: 16.3 g/dL (ref 13.0–17.0)
O2 Saturation: 71 %
Potassium: 5 mmol/L (ref 3.5–5.1)
Sodium: 139 mmol/L (ref 135–145)
TCO2: >50 mmol/L — ABNORMAL HIGH (ref 22–32)
pCO2, Ven: 94.2 mmHg (ref 44–60)
pH, Ven: 7.309 (ref 7.25–7.43)
pO2, Ven: 44 mmHg (ref 32–45)

## 2022-01-23 LAB — GLUCOSE, CAPILLARY
Glucose-Capillary: 122 mg/dL — ABNORMAL HIGH (ref 70–99)
Glucose-Capillary: 99 mg/dL (ref 70–99)

## 2022-01-23 LAB — TROPONIN I (HIGH SENSITIVITY)
Troponin I (High Sensitivity): 6 ng/L (ref <18)
Troponin I (High Sensitivity): 7 ng/L (ref <18)

## 2022-01-23 LAB — CBG MONITORING, ED: Glucose-Capillary: 109 mg/dL — ABNORMAL HIGH (ref 70–99)

## 2022-01-23 LAB — HIV ANTIBODY (ROUTINE TESTING W REFLEX): HIV Screen 4th Generation wRfx: NONREACTIVE

## 2022-01-23 LAB — MRSA NEXT GEN BY PCR, NASAL: MRSA by PCR Next Gen: NOT DETECTED

## 2022-01-23 LAB — BRAIN NATRIURETIC PEPTIDE: B Natriuretic Peptide: 19.1 pg/mL (ref 0.0–100.0)

## 2022-01-23 LAB — PROCALCITONIN: Procalcitonin: <0.1 ng/mL

## 2022-01-23 MED ORDER — HEPARIN SODIUM (PORCINE) 5000 UNIT/ML IJ SOLN
5000.0000 [IU] | Freq: Three times a day (TID) | INTRAMUSCULAR | Status: DC
  Administered 2022-01-23 – 2022-01-24 (×3): 5000 [IU] via SUBCUTANEOUS
  Filled 2022-01-23 (×4): qty 1

## 2022-01-23 MED ORDER — PERFLUTREN LIPID MICROSPHERE
1.0000 mL | INTRAVENOUS | Status: AC | PRN
  Administered 2022-01-23: 2 mL via INTRAVENOUS
  Filled 2022-01-23: qty 10

## 2022-01-23 MED ORDER — SODIUM CHLORIDE 0.9 % IV SOLN: INTRAVENOUS | Status: DC

## 2022-01-23 MED ORDER — CHLORHEXIDINE GLUCONATE 0.12 % MT SOLN
15.0000 mL | Freq: Two times a day (BID) | OROMUCOSAL | Status: DC
  Administered 2022-01-23 – 2022-01-26 (×6): 15 mL via OROMUCOSAL
  Filled 2022-01-23 (×3): qty 15

## 2022-01-23 MED ORDER — FUROSEMIDE 10 MG/ML IJ SOLN
80.0000 mg | Freq: Four times a day (QID) | INTRAMUSCULAR | Status: DC
  Administered 2022-01-23 – 2022-01-24 (×4): 80 mg via INTRAVENOUS
  Filled 2022-01-23 (×5): qty 8

## 2022-01-23 MED ORDER — ORAL CARE MOUTH RINSE
15.0000 mL | Freq: Two times a day (BID) | OROMUCOSAL | Status: DC
  Administered 2022-01-24 – 2022-01-29 (×11): 15 mL via OROMUCOSAL

## 2022-01-23 MED ORDER — CHLORHEXIDINE GLUCONATE CLOTH 2 % EX PADS
6.0000 | MEDICATED_PAD | Freq: Every day | CUTANEOUS | Status: DC
  Administered 2022-01-23 – 2022-01-25 (×3): 6 via TOPICAL

## 2022-01-23 MED ORDER — IPRATROPIUM-ALBUTEROL 0.5-2.5 (3) MG/3ML IN SOLN
3.0000 mL | RESPIRATORY_TRACT | Status: DC
  Administered 2022-01-23 – 2022-01-25 (×10): 3 mL via RESPIRATORY_TRACT
  Filled 2022-01-23 (×10): qty 3

## 2022-01-23 MED ORDER — INSULIN ASPART 100 UNIT/ML IJ SOLN
0.0000 [IU] | INTRAMUSCULAR | Status: DC
  Administered 2022-01-23 – 2022-01-26 (×4): 1 [IU] via SUBCUTANEOUS

## 2022-01-23 MED ORDER — DOCUSATE SODIUM 100 MG PO CAPS
100.0000 mg | ORAL_CAPSULE | Freq: Two times a day (BID) | ORAL | Status: DC | PRN
Start: 2022-01-23 — End: 2022-01-30

## 2022-01-23 MED ORDER — POLYETHYLENE GLYCOL 3350 17 G PO PACK: 17.0000 g | PACK | Freq: Every day | ORAL | Status: DC | PRN

## 2022-01-23 MED ORDER — PANTOPRAZOLE SODIUM 40 MG IV SOLR
40.0000 mg | Freq: Every day | INTRAVENOUS | Status: DC
  Administered 2022-01-23: 40 mg via INTRAVENOUS
  Filled 2022-01-23: qty 10

## 2022-01-23 MED ORDER — FUROSEMIDE 10 MG/ML IJ SOLN
40.0000 mg | Freq: Once | INTRAMUSCULAR | Status: AC
  Administered 2022-01-23: 40 mg via INTRAVENOUS
  Filled 2022-01-23: qty 4

## 2022-01-23 NOTE — ED Triage Notes (Signed)
Pt arrived via [facility] with c/c of Respiratory Distress. Per EMS pt fell out of bed but unsure when and was found in floor. AMS for 3 days. SPo2 64% 4LNC ? NRB had some unresponsive periods ? BVM. Diminished lung sounds. Increased edema over last couple weeks.  ? ?146/67, GCS 15 ?

## 2022-01-23 NOTE — Progress Notes (Signed)
eLink Physician-Brief Progress Note ?Patient Name: Jeffrey Skinner ?DOB: 1956-01-09 ?MRN: [id number] ? ? ?Date of Service ? 01/23/2022  ?HPI/Events of Note ? Patient admitted with acute on chronic hypoxemic / hypercapnic respiratory failure secondary to OSA / OHS related to morbid obesity. He may also have a component of mild CHF due to diastolic heart failure. He is currently on BIPAP.  ?eICU Interventions ? New Patient Evaluation.  ? ? ? ?  ? ?Thomasene Lot Daiya Tamer ?01/23/2022, 8:36 PM ?

## 2022-01-23 NOTE — ED Notes (Signed)
Report given to annabelle rn on 1m ?

## 2022-01-23 NOTE — H&P (Addendum)
? ?NAMEESTIBEN MIZUNO, MRN:  [id number], DOB:  04-29-1956, LOS: 0 ?ADMISSION DATE:  01/23/2022, CONSULTATION DATE:  4/26 ?REFERRING MD:  Anitra Lauth, CHIEF COMPLAINT:  acute on chronic HC respiratory failure  ? ?History of Present Illness:  ?66 year old MO male w/ hx as outlined below. Presented to ER after EMS called by wife after the patient rolled out of bed lying face down on the floor. On further questioning pt had been more confused over last couple days prior to admit. Review of medical records show ER visit earlier this month for increasing shortness of breath.  ?On EMS arrival pulse os 64% on home 4 lpm, mental status waxing and waning. Attempted CPAP in field but pt actually became more lethargic so required manual BVM ventilation in route to ER. In ER  ?Found to be severely hypercarbic w/ initial VBG: 7.3, pco2 94, bicarb 47, after placed on BIPAP remained hypercarbic w/ PCO2 actually worse >110 and Ph down to 7.11 so PCCM asked to assess for admission  ? ?Pertinent  Medical History  ?OSA (severe w/ AHI 53/hr) On BIPAP 22/18 and 3 lpm oxygen  ?Obesity class 3 ?Diastolic HF ?Anxiety  ?Depression ?Vit D def ?Arthritis ?Chronic respiratory failure  ?Significant Hospital Events: ?Including procedures, antibiotic start and stop dates in addition to other pertinent events   ?4/26 admitted with acute on chronic hypoxic and hypercarbic respiratory failure initial pulse oximetry in the 60s placed on BiPAP with initially minimal response ? ?Interim History / Subjective:  ?He will wake up with verbal request/stimulation but speech is slurred.  Has difficulty with air leak from mask ? ?Objective   ?Blood pressure (Abnormal) 156/134, pulse 85, temperature 98.1 ?F (36.7 ?C), temperature source Oral, resp. rate 20, height 6\' 4"  (1.93 m), weight (Abnormal) 227.3 kg, SpO2 93 %. ?   ?   ?No intake or output data in the 24 hours ending 01/23/22 1304 ?Filed Weights  ? 01/23/22 0952  ?Weight: (Abnormal) 227.3 kg   ? ? ?Examination: ?General: This is a morbidly obese 67 year old male patient who is currently on noninvasive positive pressure ventilation and maximized on ventilatory support, attempts to increase titration resulted in significant air leak ?HENT: Neck is large, no obvious jugular venous distention sclera nonicteric currently has BiPAP mask in place ?Lungs: Diminished bilaterally ?Cardiovascular: Regular rate and rhythm ?Abdomen: Obese ?Extremities: Warm chronic venous stasis changes lower extremities ?Neuro: Slow to wake up, unable to determine orientation, moves extremities ?GU: Clear yellow urine ? ?Resolved Hospital Problem list   ? ? ?Assessment & Plan:  ?Active Problems: ?  Acute on chronic respiratory failure with hypoxia and hypercapnia (HCC) ?  Acute diastolic CHF (congestive heart failure) (HCC) ?  Class 3 severe obesity due to excess calories with serious comorbidity and body mass index (BMI) of 60.0 to 69.9 in adult [facility]) ?  Obstructive sleep apnea ?  Essential hypertension ?  (HFpEF) heart failure with preserved ejection fraction (HCC) ? ?Acute on chronic hypoxic and hypercarbic respiratory failure in the setting of decompensated diastolic heart failure and cor pulmonale with pulmonary edema in the context of severe obesity hypoventilation syndrome and obstructive sleep apnea ?-does not have BIPAP machine .  note from [facility] suggests he needs fairly high level of support ?Plan ?Continuing current noninvasive positive pressure ventilation, unfortunately he has had a IPAP of 23, and attempts to further titrate either EPAP or IPAP parameters resulted in significant air leak ?Will repeat arterial blood gas as clinically he is  reported to be more awake and easily arousable, however if she is worse or remains significantly hypercarbic he will require intubation with mechanical ventilation.  It appears as though his baseline PCO2 is in the 68-70 range ?IV diuresis ?Serial BNP ?Will order echocardiogram,  however I worry body habitus will be a barrier to a good study here ?Admit to the intensive care ?We will continue bronchodilators, has a history of "COPD" I suspect this is more chronic dyspnea from his obesity ? ?Acute diastolic heart failure, acute cor pulmonale, with pulmonary edema. ?Plan ?IV diuresis ?Supplemental oxygen ?A.m. chest x-ray ?Echo as above ? ?Acute metabolic encephalopathy secondary to hypercarbia and possibly also hypoxia ?Plan ?Continuing to support respiratory failure ?Holding any sedating meds ?Supportive care ? ? ?Best Practice (right click and "Reselect all SmartList Selections" daily)  ? ?Diet/type: NPO ?DVT prophylaxis: prophylactic heparin  ?GI prophylaxis: PPI ?Lines: N/A ?Foley:  N/A ?Code Status:  full code ?Last date of multidisciplinary goals of care discussion [ pending ] ? ?Labs   ?CBC: ?Recent Labs  ?Lab 01/23/22 ?0950 01/23/22 ?1006  ?WBC  --  9.4  ?NEUTROABS  --  7.3  ?HGB 16.3 13.2  ?HCT 48.0 48.4  ?MCV  --  105.9*  ?PLT  --  176  ? ? ?Basic Metabolic Panel: ?Recent Labs  ?Lab 01/23/22 ?0950 01/23/22 ?1006  ?NA 139 141  ?K 5.0 4.7  ?CL  --  94*  ?CO2  --  40*  ?GLUCOSE  --  126*  ?BUN  --  8  ?CREATININE  --  0.94  ?CALCIUM  --  7.9*  ? ?GFR: ?Estimated Creatinine Clearance: 160.6 mL/min (by C-G formula based on SCr of 0.94 mg/dL). ?Recent Labs  ?Lab 01/23/22 ?1006  ?WBC 9.4  ? ? ?Liver Function Tests: ?Recent Labs  ?Lab 01/23/22 ?1006  ?AST 23  ?ALT 15  ?ALKPHOS 59  ?BILITOT 0.8  ?PROT 7.7  ?ALBUMIN 3.2*  ? ?No results for input(s): LIPASE, AMYLASE in the last 168 hours. ?No results for input(s): AMMONIA in the last 168 hours. ? ?ABG ?   ?Component Value Date/Time  ? PHART 7.263 (L) 12/30/2020 0025  ? PCO2ART 79.7 (HH) 12/30/2020 0025  ? PO2ART 137 (H) 12/30/2020 0025  ? HCO3 47.3 (H) 01/23/2022 0950  ? TCO2 >50 (H) 01/23/2022 0950  ? O2SAT 71 01/23/2022 0950  ?  ? ?Coagulation Profile: ?No results for input(s): INR, PROTIME in the last 168 hours. ? ?Cardiac Enzymes: ?No  results for input(s): CKTOTAL, CKMB, CKMBINDEX, TROPONINI in the last 168 hours. ? ?HbA1C: ?Hemoglobin A1C  ?Date/Time Value Ref Range Status  ?06/24/2019 09:21 AM 5.2 4.0 - 5.6 % Final  ? ?HbA1c, POC (prediabetic range)  ?Date/Time Value Ref Range Status  ?06/24/2019 09:21 AM 5.2 (A) 5.7 - 6.4 % Final  ? ?HbA1c, POC (controlled diabetic range)  ?Date/Time Value Ref Range Status  ?06/24/2019 09:21 AM 5.2 0.0 - 7.0 % Final  ? ?HbA1c POC (<> result, manual entry)  ?Date/Time Value Ref Range Status  ?06/24/2019 09:21 AM 5.2 4.0 - 5.6 % Final  ? ?Hgb A1c MFr Bld  ?Date/Time Value Ref Range Status  ?01/09/2017 01:38 AM 5.5 4.8 - 5.6 % Final  ?  Comment:  ?  (NOTE) ?        Pre-diabetes: 5.7 - 6.4 ?        Diabetes: >6.4 ?        Glycemic control for adults with diabetes: <7.0 ?  ? ? ?CBG: ?  No results for input(s): GLUCAP in the last 168 hours. ? ?Review of Systems:   ?Not able  ? ?Past Medical History:  ?He,  has a past medical history of Acute diastolic CHF (congestive heart failure) (HCC) (06/14/2019), Anxiety (12/16/2019), Anxiety (11/2019), Arthritis, COPD (chronic obstructive pulmonary disease) (HCC) (12/30/2020), Depression, Dyspnea, Hypertension, Insomnia (11/2029), Morbid obesity with BMI of 60.0-69.9, adult [facility](HCC), Sleep apnea, and Vitamin D deficiency (06/2019).  ? ?Surgical History:  ? ?Past Surgical History:  ?Procedure Laterality Date  ? APPENDECTOMY    ? CHOLECYSTECTOMY    ? HERNIA REPAIR    ? VASECTOMY  1998  ?  ? ?Social History:  ? reports that he has never smoked. He has never used smokeless tobacco. He reports current alcohol use of about 1.0 standard drink per week. He reports that he does not use drugs.  ? ?Family History:  ?His family history includes Arthritis in his sister; Cancer - Lung in his brother; Heart disease in his brother; Rheum arthritis in his mother.  ? ?Allergies ?Allergies  ?Allergen Reactions  ? Dog Epithelium   ?  ? ?Home Medications  ?Prior to Admission medications   ?Medication Sig  Start Date End Date Taking? Authorizing Provider  ?amLODipine (NORVASC) 5 MG tablet Take 1 tablet (5 mg total) by mouth daily. 04/11/21 04/11/22 Yes Barbette MerinoKing, Crystal M, NP  ?fluticasone furoate-vilanterol (BREO ELLIPTA) 200-25 MCG/IN

## 2022-01-23 NOTE — ED Notes (Signed)
The pt needs a bariatric bed there are none in the hospital  one has to be ordered from outside  also from 59m  the room he was scheduled to come to  has a lift that will not hold the pt so they are moving another pt from another  room that has a larger lift  that from 19m ?

## 2022-01-23 NOTE — ED Provider Notes (Signed)
?[facility] EMERGENCY DEPARTMENT ?Provider Note ? ? ?CSN: [id number] ?Arrival date & time: 01/23/22  4970 ? ?  ? ?History ? ?Chief Complaint  ?Patient presents with  ? Respiratory Distress  ? ? ?Jeffrey Skinner is a 66 y.o. male. ? ?Patient is a 66 year old male with a history of super morbid obesity, hypertension, diastolic CHF, COPD on inhalers at home who is presenting today with EMS for altered mental status and hypoxia.  The history was given by EMS and by the people who live at home with the patient.  They reported that for the last few days he has seemed a bit confused but then this morning they saw that he had rolled out of bed and he was lying facedown on the floor.  When EMS arrived patient was awake and alert and able to answer some of their questions but he was found to have an oxygen saturation of 64% on his home 4 L of oxygen and he had waxing and waning of mental status.  Patient was complaining of shortness of breath at this time.  Family did not note any fever, new cough, vomiting or diarrhea.  EMS reports that they tried to switch patient over to BiPAP however when they placed him on CPAP he became unresponsive so they just started to assist his ventilation with bag valve mask.  His oxygen saturation remained normal, blood pressure was 140 systolic and heart rate was normal.  They attempted an IO in his left lower leg but it was unsuccessful.  No further history is available at this time. ? ?The history is provided by the EMS personnel and medical records. The history is limited by the condition of the patient.  ? ?  ? ?Home Medications ?Prior to Admission medications   ?Medication Sig Start Date End Date Taking? Authorizing Provider  ?albuterol (VENTOLIN HFA) 108 (90 Base) MCG/ACT inhaler Inhale 2 puffs into the lungs every 6 (six) hours as needed for wheezing or shortness of breath.   Yes [provider]  ?amLODipine (NORVASC) 5 MG tablet Take 1 tablet (5 mg total) by  mouth daily. 04/11/21 04/11/22 Yes Barbette Merino, NP  ?furosemide (LASIX) 40 MG tablet Take 40 mg by mouth 2 (two) times daily.   Yes [provider]  ?ipratropium-albuterol (DUONEB) 0.5-2.5 (3) MG/3ML SOLN Take 3 mLs by nebulization every 6 (six) hours as needed (shortness of breath). 01/02/22 02/01/22 Yes Mesner, Barbara Cower, MD  ?benzonatate (TESSALON) 100 MG capsule Take 1 capsule (100 mg total) by mouth 3 (three) times daily as needed for cough. ?Patient not taking: Reported on 01/23/2022 01/02/21   Cathren Harsh, MD  ?Catheters (GIZMO CONDOM CATHETER) MISC 1 application by Does not apply route in the morning and at bedtime. 04/16/21 04/16/22  Barbette Merino, NP  ?fluticasone furoate-vilanterol (BREO ELLIPTA) 200-25 MCG/INH AEPB Inhale 1 puff into the lungs daily. 01/02/21   Rai, Delene Ruffini, MD  ?furosemide (LASIX) 80 MG tablet Take 0.5 tablets (40 mg total) by mouth 2 (two) times daily. ?Patient not taking: Reported on 01/23/2022 04/11/21 04/11/22  Barbette Merino, NP  ?Incontinence Supplies (BARD URINARY DRAINAGE BAG) MISC 1 application by Does not apply route in the morning and at bedtime. 04/11/21   Barbette Merino, NP  ?losartan (COZAAR) 50 MG tablet Take 1 tablet (50 mg total) by mouth daily. ?Patient not taking: Reported on 01/23/2022 01/02/21   Rai, Delene Ruffini, MD  ?predniSONE (DELTASONE) 20 MG tablet 3 tabs po  day one, then 2 po daily x 4 days ?Patient not taking: Reported on 01/23/2022 01/02/22   Mesner, Barbara Cower, MD  ?sildenafil (VIAGRA) 100 MG tablet Take 1 tablet (100 mg total) by mouth daily as needed for erectile dysfunction. 04/11/21   Barbette Merino, NP  ?   ? ?Allergies    ?Dog epithelium   ? ?Review of Systems   ?Review of Systems ? ?Physical Exam ?Updated Vital Signs ?BP 136/66   Pulse 77   Temp 98.1 ?F (36.7 ?C) (Oral)   Resp 14   Ht 6\' 4"  (1.93 m)   Wt (!) 227.3 kg   SpO2 99%   BMI 61.00 kg/m?  ?Physical Exam ?Vitals and nursing note reviewed.  ?Constitutional:   ?   General: He is not in acute  distress. ?   Appearance: He is well-developed. He is ill-appearing.  ?   Comments: Patient does open his eyes intermittently seems to have intermittent periods of being lucid  ?HENT:  ?   Head: Normocephalic and atraumatic.  ?   Nose: Nose normal.  ?   Mouth/Throat:  ?   Mouth: Mucous membranes are dry.  ?Eyes:  ?   Conjunctiva/sclera: Conjunctivae normal.  ?   Comments: Pupils are 2 mm bilaterally and sluggishly reactive  ?Cardiovascular:  ?   Rate and Rhythm: Normal rate and regular rhythm.  ?   Heart sounds: No murmur heard. ?Pulmonary:  ?   Effort: Pulmonary effort is normal. No respiratory distress.  ?   Breath sounds: Normal breath sounds. No wheezing or rales.  ?   Comments: Upper airway sounds are clear, breath sounds are diminished diffusely due to body habitus ?Abdominal:  ?   General: There is no distension.  ?   Palpations: Abdomen is soft.  ?   Tenderness: There is no abdominal tenderness. There is no guarding or rebound.  ?Musculoskeletal:     ?   General: No tenderness. Normal range of motion.  ?   Cervical back: Normal range of motion and neck supple.  ?   Right lower leg: Edema present.  ?   Left lower leg: Edema present.  ?   Comments: Edema noted in bilateral lower extremities  ?Skin: ?   General: Skin is warm and dry.  ?   Findings: No erythema or rash.  ?   Comments: Rash in the groin and under the scrotum consistent with yeast  ?Neurological:  ?   Comments: To voice patient will open his eyes and was able to say his full name but mental status waxes and wanes.  He is noted to move all 4 extremities.  ?Psychiatric:  ?   Comments: Calm  ? ? ?ED Results / Procedures / Treatments   ?Labs ?(all labs ordered are listed, but only abnormal results are displayed) ?Labs Reviewed  ?CBC WITH DIFFERENTIAL/PLATELET - Abnormal; Notable for the following components:  ?    Result Value  ? MCV 105.9 (*)   ? MCHC 27.3 (*)   ? RDW 15.7 (*)   ? nRBC 1.0 (*)   ? Monocytes Absolute 1.1 (*)   ? All other components  within normal limits  ?URINALYSIS, ROUTINE W REFLEX MICROSCOPIC - Abnormal; Notable for the following components:  ? Color, Urine AMBER (*)   ? APPearance HAZY (*)   ? Ketones, ur 5 (*)   ? Protein, ur 30 (*)   ? Bacteria, UA RARE (*)   ? Non Squamous Epithelial 0-5 (*)   ?  All other components within normal limits  ?COMPREHENSIVE METABOLIC PANEL - Abnormal; Notable for the following components:  ? Chloride 94 (*)   ? CO2 40 (*)   ? Glucose, Bld 126 (*)   ? Calcium 7.9 (*)   ? Albumin 3.2 (*)   ? All other components within normal limits  ?I-STAT VENOUS BLOOD GAS, ED - Abnormal; Notable for the following components:  ? pCO2, Ven 94.2 (*)   ? Bicarbonate 47.3 (*)   ? TCO2 >50 (*)   ? Acid-Base Excess 15.0 (*)   ? Calcium, Ion 0.95 (*)   ? All other components within normal limits  ?BRAIN NATRIURETIC PEPTIDE  ?I-STAT VENOUS BLOOD GAS, ED  ?I-STAT VENOUS BLOOD GAS, ED  ?TROPONIN I (HIGH SENSITIVITY)  ?TROPONIN I (HIGH SENSITIVITY)  ? ? ?EKG ?EKG Interpretation ? ?Date/Time:  Wednesday January 23 2022 10:30:19 EDT ?Ventricular Rate:  79 ?PR Interval:  153 ?QRS Duration: 100 ?QT Interval:  406 ?QTC Calculation: 466 ?R Axis:   23 ?Text Interpretation: Sinus rhythm Low voltage, precordial leads Nonspecific T abnormalities, inferior leads No significant change since last tracing Confirmed by Gwyneth SproutPlunkett, Karly Pitter ([id number]) on 01/23/2022 11:04:50 AM ? ?Radiology ?DG Chest Port 1 View ? ?Result Date: 01/23/2022 ?CLINICAL DATA:  Short of breath.  Altered mental status EXAM: PORTABLE CHEST 1 VIEW COMPARISON:  01/01/2022 FINDINGS: To low volume portable radiographs, limited by patient body habitus. Numerous leads and wires project over the chest. Patient rotated right. Mild cardiomegaly. No pleural effusion or pneumothorax. Development of diffuse interstitial and airspace disease bilaterally. IMPRESSION: Mildly limited portable radiograph, as detailed above. Development of diffuse interstitial and airspace disease. Pulmonary edema favored  over multifocal infection. Cardiomegaly. Electronically Signed   By: Jeronimo GreavesKyle  Talbot M.D.   On: 01/23/2022 10:21   ? ?Procedures ?Procedures  ? ? ?Medications Ordered in ED ?Medications  ?furosemide (LASIX) injection 80

## 2022-01-23 NOTE — ED Notes (Signed)
VBG obtained preformed . Unable to get full test results..VBG preformed twice with no results. Blood obtained for the second time with the same results. Marta Antu RN made aware. ED-LAb ?

## 2022-01-23 NOTE — Assessment & Plan Note (Signed)
Diastolic heart failure due hypertensive heart disease and possible impaired LV filling from cor pulmonale.  ? ?- Attempt diuresis. May be difficult in context of RV dysfunction.  ?

## 2022-01-23 NOTE — ED Notes (Signed)
Nurse did not give her last name  had to call and give report ?

## 2022-01-23 NOTE — Assessment & Plan Note (Signed)
-   Needs bariatric bed and weight counseling ?

## 2022-01-23 NOTE — Assessment & Plan Note (Signed)
No prior treatment.  ? ?- Will need CPAP arranged at discharge at a minimum. May require BiPAP.  ?

## 2022-01-23 NOTE — ED Notes (Signed)
The pts by-pap is alarming  resp therapy will see after after shift change ?

## 2022-01-23 NOTE — Assessment & Plan Note (Addendum)
Worsening hypercapnia despite BiPAP, but significant air leak.  ? ?- Improved mask seal. Follow clinical mentation, ABG. ?- Wife aware that patient may require intubation and that given the severity of his OSA/OHS, he may require a tracheostomy.  ?- Significant hypercapnia and patient is somnolent but remains arousable.  Likely difficult airway, would prefer to avoid intubation if possible. ? ?

## 2022-01-23 NOTE — Progress Notes (Signed)
Pt transported by NT, RT and RN from [facility] to [id number] w/o complications ?

## 2022-01-24 ENCOUNTER — Inpatient Hospital Stay (HOSPITAL_COMMUNITY): Payer: 59

## 2022-01-24 DIAGNOSIS — J9621 Acute and chronic respiratory failure with hypoxia: Secondary | ICD-10-CM | POA: Diagnosis not present

## 2022-01-24 LAB — BASIC METABOLIC PANEL
Anion gap: 6 (ref 5–15)
BUN: 10 mg/dL (ref 8–23)
BUN: 11 mg/dL (ref 8–23)
CO2: 44 mmol/L — ABNORMAL HIGH (ref 22–32)
CO2: >45 mmol/L — ABNORMAL HIGH (ref 22–32)
Calcium: 8 mg/dL — ABNORMAL LOW (ref 8.9–10.3)
Calcium: 7.8 mg/dL — ABNORMAL LOW (ref 8.9–10.3)
Chloride: 93 mmol/L — ABNORMAL LOW (ref 98–111)
Chloride: 90 mmol/L — ABNORMAL LOW (ref 98–111)
Creatinine, Ser: 0.98 mg/dL (ref 0.61–1.24)
Creatinine, Ser: 1.07 mg/dL (ref 0.61–1.24)
GFR, Estimated: >60 mL/min (ref >60)
GFR, Estimated: >60 mL/min (ref >60)
Glucose, Bld: 99 mg/dL (ref 70–99)
Glucose, Bld: 108 mg/dL — ABNORMAL HIGH (ref 70–99)
Potassium: 4.6 mmol/L (ref 3.5–5.1)
Potassium: 4.2 mmol/L (ref 3.5–5.1)
Sodium: 143 mmol/L (ref 135–145)
Sodium: 142 mmol/L (ref 135–145)

## 2022-01-24 LAB — POCT I-STAT 7, (LYTES, BLD GAS, ICA,H+H)
Acid-Base Excess: 15 mmol/L — ABNORMAL HIGH (ref 0.0–2.0)
Bicarbonate: 46.5 mmol/L — ABNORMAL HIGH (ref 20.0–28.0)
Calcium, Ion: 1.1 mmol/L — ABNORMAL LOW (ref 1.15–1.40)
HCT: 43 % (ref 39.0–52.0)
Hemoglobin: 14.6 g/dL (ref 13.0–17.0)
O2 Saturation: 95 %
Patient temperature: 98.4
Potassium: 4.6 mmol/L (ref 3.5–5.1)
Sodium: 141 mmol/L (ref 135–145)
TCO2: 49 mmol/L — ABNORMAL HIGH (ref 22–32)
pCO2 arterial: 94.9 mmHg (ref 32–48)
pH, Arterial: 7.298 — ABNORMAL LOW (ref 7.35–7.45)
pO2, Arterial: 87 mmHg (ref 83–108)

## 2022-01-24 LAB — ECHOCARDIOGRAM COMPLETE
AR max vel: 2.78 cm2
AV Peak grad: 6.2 mmHg
Ao pk vel: 1.25 m/s
Area-P 1/2: 2.94 cm2
Height: 76 in
S' Lateral: 2.8 cm
Weight: 8017.69 oz

## 2022-01-24 LAB — GLUCOSE, CAPILLARY
Glucose-Capillary: 79 mg/dL (ref 70–99)
Glucose-Capillary: 85 mg/dL (ref 70–99)
Glucose-Capillary: 150 mg/dL — ABNORMAL HIGH (ref 70–99)
Glucose-Capillary: 114 mg/dL — ABNORMAL HIGH (ref 70–99)
Glucose-Capillary: 134 mg/dL — ABNORMAL HIGH (ref 70–99)

## 2022-01-24 LAB — CBC
HCT: 44.6 % (ref 39.0–52.0)
Hemoglobin: 12.8 g/dL — ABNORMAL LOW (ref 13.0–17.0)
MCH: 29.6 pg (ref 26.0–34.0)
MCHC: 28.7 g/dL — ABNORMAL LOW (ref 30.0–36.0)
MCV: 103 fL — ABNORMAL HIGH (ref 80.0–100.0)
Platelets: 167 10*3/uL (ref 150–400)
RBC: 4.33 MIL/uL (ref 4.22–5.81)
RDW: 15.7 % — ABNORMAL HIGH (ref 11.5–15.5)
WBC: 8.1 10*3/uL (ref 4.0–10.5)
nRBC: 0.4 % — ABNORMAL HIGH (ref 0.0–0.2)

## 2022-01-24 MED ORDER — DEXTROSE 5 % IV SOLN
500.0000 mg | Freq: Once | INTRAVENOUS | Status: AC
  Administered 2022-01-24: 500 mg via INTRAVENOUS
  Filled 2022-01-24: qty 500

## 2022-01-24 MED ORDER — HEPARIN SODIUM (PORCINE) 5000 UNIT/ML IJ SOLN
7500.0000 [IU] | Freq: Three times a day (TID) | INTRAMUSCULAR | Status: DC
Start: 2022-01-24 — End: 2022-01-30
  Administered 2022-01-24 – 2022-01-30 (×17): 7500 [IU] via SUBCUTANEOUS
  Filled 2022-01-24 (×17): qty 2

## 2022-01-24 MED ORDER — METOLAZONE 5 MG PO TABS: 5.0000 mg | ORAL_TABLET | Freq: Once | ORAL | Status: DC

## 2022-01-24 MED ORDER — FUROSEMIDE 10 MG/ML IJ SOLN
80.0000 mg | Freq: Two times a day (BID) | INTRAMUSCULAR | Status: DC
  Administered 2022-01-24 – 2022-01-27 (×6): 80 mg via INTRAVENOUS
  Filled 2022-01-24 (×6): qty 8

## 2022-01-24 NOTE — Progress Notes (Signed)
eLink Physician-Brief Progress Note ?Patient Name: Jeffrey Skinner ?DOB: 16-Aug-1956 ?MRN: [id number] ? ? ?Date of Service ? 01/24/2022  ?HPI/Events of Note ? ABG reviewed and significantly improved from earlier ABG, per RT he was readily awakened and fully interactive when she stuck him for the blood sample for the ABG, tidal volumes and minute ventilation are both robust.  ?eICU Interventions ? Will monitor him closely for now, this was discussed with RT as well.  ? ? ? ?  ? ?Kerry Kass Thurman Sarver ?01/24/2022, 2:10 AM ?

## 2022-01-24 NOTE — Progress Notes (Signed)
eLink Physician-Brief Progress Note ?Patient Name: Jeffrey Skinner ?DOB: [birth year] ?MRN: [id number] ? ? ?Date of Service ? 01/24/2022  ?HPI/Events of Note ? Patient is drowsier than his baseline earlier in the evening. Saturation is 94 % on the monitor.  ?eICU Interventions ? Stat ABG ordered.  ? ? ? ?  ? ?Jeffrey Skinner ?01/24/2022, 12:57 AM ?

## 2022-01-24 NOTE — Progress Notes (Signed)
? ?NAMEMARTINE TRAGESER, MRN:  [id number], DOB:  01/28/1956, LOS: 1 ?ADMISSION DATE:  01/23/2022, CONSULTATION DATE:  4/26 ?REFERRING MD:  Anitra Lauth, CHIEF COMPLAINT:  acute on chronic HC respiratory failure  ? ?History of Present Illness:  ?66 year old MO male w/ hx as outlined below. Presented to ER after EMS called by wife after the patient rolled out of bed lying face down on the floor. On further questioning pt had been more confused over last couple days prior to admit. Review of medical records show ER visit earlier this month for increasing shortness of breath.  ?On EMS arrival pulse os 64% on home 4 lpm, mental status waxing and waning. Attempted CPAP in field but pt actually became more lethargic so required manual BVM ventilation in route to ER. In ER  ?Found to be severely hypercarbic w/ initial VBG: 7.3, pco2 94, bicarb 47, after placed on BIPAP remained hypercarbic w/ PCO2 actually worse >110 and Ph down to 7.11 so PCCM asked to assess for admission  ? ?Pertinent  Medical History  ?OSA (severe w/ AHI 53/hr) On BIPAP 22/18 and 3 lpm oxygen  ?Obesity class 3 ?Diastolic HF ?Anxiety  ?Depression ?Vit D def ?Arthritis ?Chronic respiratory failure  ?Significant Hospital Events: ?Including procedures, antibiotic start and stop dates in addition to other pertinent events   ?4/26 admitted with acute on chronic hypoxic and hypercarbic respiratory failure initial pulse oximetry in the 60s placed on BiPAP with initially minimal response ?4/27 off bipap, paranoid ? ?Interim History / Subjective:  ?Off bipap. ?Hungry ?Thinks we are trying to kill him ?Thinks wife is trying to kill him ? ?Objective   ?Blood pressure 127/90, pulse 82, temperature 98.9 ?F (37.2 ?C), temperature source Axillary, resp. rate (!) 21, height 6\' 4"  (1.93 m), weight (!) 227.3 kg, SpO2 100 %. ?   ?Vent Mode: BIPAP;PCV ?FiO2 (%):  [35 %-60 %] 35 % ?Set Rate:  [20 bmp] 20 bmp ?PEEP:  [8 cmH20] 8 cmH20  ? ?Intake/Output Summary (Last 24 hours) at  01/24/2022 0833 ?Last data filed at 01/24/2022 0600 ?Gross per 24 hour  ?Intake --  ?Output 700 ml  ?Net -700 ml  ? ?Filed Weights  ? 01/23/22 0952  ?Weight: (!) 227.3 kg  ? ? ?Examination: ?No distress ?[place] 4 ?Lung and heart sounds distant due to body habitus ?Abdomen soft, nontender ?Ext with adipose tissue vs. Edema ?Moves all 4 ext to command, CN appear intact ?AO to self only, paranoid ? ?Resolved Hospital Problem list   ? ? ?Assessment & Plan:  ?Acute on chronic hypoxemic and hypercapneic respiratory failure due to decompensated OSA/OHS. ? ?Treating for volume overload, not sure how much fluid he has to give, same weight as last fall. ? ?BIPAP titration study from 2/23 noted: Recommend a BIPAP pressure of 22/18 cwp with heated humidification and 3 LPM of supplemental oxygen for home PAP therapy with close follow-up. Despite use of BPAP and 3 LPM of supplemental oxygen, the patient's oxygen saturations during sleep remained in the upper 80s%.  ? ?Hypercarbic encephalopathy with paranoid behavior, nonfocal neuro exam ? ?- Continue diuresis as tolerated by renal function (lasix 80 BID, give an extra dose of metolazone vs. Diuril) ?- Recheck ABG vs. VBG given ongoing encephalopathy, will need to use BIPAP qHS and PRN.  Will do settings as per sleep study above. ?- Keep in ICU for now, high risk for intubation and trach ? ?Best Practice (right click and "Reselect all SmartList Selections" daily)  ? ?  Diet/type: NPO, consider cortrak tomorrow if unable to take enough PO ?DVT prophylaxis: prophylactic heparin  ?GI prophylaxis: PPI ?Lines: N/A ?Foley:  N/A ?Code Status:  full code ?Last date of multidisciplinary goals of care discussion [ pending ] ? ?41 min cc time ?Myrla Halsted MD PCCM ? ? ?

## 2022-01-24 NOTE — Progress Notes (Addendum)
Pt admitted to room 3M10. Pt will remain on bed from the transferring unit until bariatric bed arrives. Pt is somnolent but arouses easily . Pts O2 sat on arrival is in the mid 90s on BiPAP. Patient is alert to self but confused.   ? ?Vitals on admission: ? ?BP 145/73  ?HR 85 ?RR 18 ?

## 2022-01-24 NOTE — Progress Notes (Signed)
RT removed pt from BiPAP and placed on 4L [place]. RN notified. Pt tolerating well with SVS. RT will continue to monitor pt. ?

## 2022-01-24 NOTE — Progress Notes (Signed)
eLink Physician-Brief Progress Note ?Patient Name: Jeffrey Skinner ?DOB: 1955/12/23 ?MRN: [id number] ? ? ?Date of Service ? 01/24/2022  ?HPI/Events of Note ? Patient admitted with altered mental status but never had a head CT scan.  ?eICU Interventions ? Non-contrast head CT ordered for the AM for the sake of completeness.  ? ? ? ?  ? ?Kerry Kass Orra Nolde ?01/24/2022, 4:47 AM ?

## 2022-01-24 NOTE — Progress Notes (Signed)
RT placed pt on BiPAP. Pt tolerated well with SVS. RN notified. RT will continue to monitor pt. ?

## 2022-01-24 NOTE — Progress Notes (Signed)
RT removed pt from BiPAP, per CCM, and placed pt on 4L [place]. Pt tolerating well with SVS at this time. RN made aware. RT will continue to monitor pt. ?

## 2022-01-24 NOTE — TOC Initial Note (Signed)
Transition of Care (TOC) - Initial/Assessment Note  ? ? ?Patient Details  ?Name: Jeffrey Skinner ?MRN: [id number] ?Date of Birth: [birth year] ? ?Transition of Care (TOC) CM/SW Contact:    ?Tom-Johnson, Hershal Coria, RN ?Phone Number: ?01/24/2022, 2:42 PM ? ?Clinical Narrative:                 ? ?Patient is admitted for Acute Diastolic CHF. Found down at home. Initially on BIPAP, now PRN.  ?CM spoke with wife Erie Noe and she states patient gets his home oxygen supplies from [place]. States patient had an appointment at [facility] to get a BIPAP [facility] but appointment was cancelled d/t [facility]. CM called and left a secure message for Mallie Darting [phone] ex [phone]) to return call. Awaiting call. No PT/OT recommendations noted at this time. CM will continue to follow with needs. ? ?  ?Barriers to Discharge: Continued Medical Work up ? ? ?Patient Goals and CMS Choice ?Patient states their goals for this hospitalization and ongoing recovery are:: To return home ?CMS Medicare.gov Compare Post Acute Care list provided to:: Patient ?Choice offered to / list presented to : Patient, Spouse ? ?Expected Discharge Plan and Services ?  ?  ?Discharge Planning Services: CM Consult ?  ?Living arrangements for the past 2 months: Single Family Home ?                ?  ?  ?  ?  ?  ?  ?  ?  ?  ?  ? ?Prior Living Arrangements/Services ?Living arrangements for the past 2 months: Single Family Home ?Lives with:: Spouse ?Patient language and need for interpreter reviewed:: Yes ?       ?Need for Family Participation in Patient Care: Yes (Comment) ?Care giver support system in place?: Yes (comment) ?Current home services: DME ([employer], home O2.) ?Criminal Activity/Legal Involvement Pertinent to Current Situation/Hospitalization: No - Comment as needed ? ?Activities of Daily Living ?  ?  ? ?Permission Sought/Granted ?Permission sought to share information with : Case Manager, Optician, dispensing, Family Supports ?Permission granted to share information with : Yes, Verbal Permission Granted ?   ?   ?   ?   ? ?Emotional Assessment ?Appearance:: Appears stated age ?  ?  ?  ?  ?  ? ?Admission diagnosis:  Acute respiratory failure with hypercapnia (HCC) [J96.02] ?Acute on chronic respiratory failure (HCC) [J96.20] ?Acute congestive heart failure, unspecified heart failure type (HCC) [I50.9] ?Patient Active Problem List  ? Diagnosis Date Noted  ? Abdominal pain 12/30/2020  ? Atypical chest pain 12/30/2020  ? COPD (chronic obstructive pulmonary disease) (HCC) 12/30/2020  ? Obstructive sleep apnea 12/30/2020  ? Essential hypertension 12/30/2020  ? Hypoalbuminemia 12/30/2020  ? Right leg swelling 12/30/2020  ? Lactic acidosis 12/30/2020  ? SIRS (systemic inflammatory response syndrome) (HCC) 12/29/2020  ? Anxiety 12/16/2019  ? Insomnia 12/16/2019  ? Class 3 severe obesity due to excess calories with serious comorbidity and body mass index (BMI) of 60.0 to 69.9 in adult [facility]) 12/16/2019  ? Acute foot pain, left 07/13/2019  ? Acute diastolic CHF (congestive heart failure) (HCC) 06/14/2019  ? Acute respiratory failure with hypoxia and hypercapnia (HCC) 06/09/2019  ? Acute on chronic respiratory failure with hypoxia and hypercapnia (HCC) 06/09/2019  ? Respiratory failure with hypoxia (HCC) 01/09/2017  ? SOB (shortness of breath)   ? Hypoxia   ? ?PCP:  Barbette Merino, NP ?Pharmacy:   ?[facility] [phone] -  [place], [place] - [address] ?[address] ?[place] [address] ?Phone: [phone] Fax: [phone] ? ?[facility] - [place], [place] - [address] ?[phone] ?[place] [address] ?Phone: [phone] Fax: [phone] ? ? ? ? ?Social Determinants of Health (SDOH) Interventions ?  ? ?Readmission Risk Interventions ?   ? View : No data to display.  ?  ?  ?  ? ? ? ?

## 2022-01-24 NOTE — Progress Notes (Signed)
Improving through morning. ?Change BIPAP to qHS and PRN, 22/18 per recent sleep study ?Will ask TOC team to see if we can get him this at home prior to DC to reduce readmission risk. ? ?Stable for transfer to progressive, appreciate TRH taking over starting 4/28 AM. ? ?Myrla Halsted MD PCCM ?

## 2022-01-25 DIAGNOSIS — I5033 Acute on chronic diastolic (congestive) heart failure: Secondary | ICD-10-CM | POA: Diagnosis not present

## 2022-01-25 DIAGNOSIS — J9622 Acute and chronic respiratory failure with hypercapnia: Secondary | ICD-10-CM | POA: Diagnosis not present

## 2022-01-25 DIAGNOSIS — J9621 Acute and chronic respiratory failure with hypoxia: Secondary | ICD-10-CM | POA: Diagnosis not present

## 2022-01-25 LAB — BASIC METABOLIC PANEL
BUN: 10 mg/dL (ref 8–23)
CO2: >45 mmol/L — ABNORMAL HIGH (ref 22–32)
Calcium: 7.9 mg/dL — ABNORMAL LOW (ref 8.9–10.3)
Chloride: 91 mmol/L — ABNORMAL LOW (ref 98–111)
Creatinine, Ser: 0.91 mg/dL (ref 0.61–1.24)
GFR, Estimated: >60 mL/min (ref >60)
Glucose, Bld: 113 mg/dL — ABNORMAL HIGH (ref 70–99)
Potassium: 4.3 mmol/L (ref 3.5–5.1)
Sodium: 140 mmol/L (ref 135–145)

## 2022-01-25 LAB — GLUCOSE, CAPILLARY
Glucose-Capillary: 109 mg/dL — ABNORMAL HIGH (ref 70–99)
Glucose-Capillary: 120 mg/dL — ABNORMAL HIGH (ref 70–99)
Glucose-Capillary: 120 mg/dL — ABNORMAL HIGH (ref 70–99)
Glucose-Capillary: 139 mg/dL — ABNORMAL HIGH (ref 70–99)
Glucose-Capillary: 142 mg/dL — ABNORMAL HIGH (ref 70–99)
Glucose-Capillary: 147 mg/dL — ABNORMAL HIGH (ref 70–99)
Glucose-Capillary: 168 mg/dL — ABNORMAL HIGH (ref 70–99)

## 2022-01-25 LAB — MAGNESIUM: Magnesium: 1.7 mg/dL (ref 1.7–2.4)

## 2022-01-25 MED ORDER — SPIRONOLACTONE 25 MG PO TABS
25.0000 mg | ORAL_TABLET | Freq: Every day | ORAL | Status: DC
  Administered 2022-01-25 – 2022-01-30 (×6): 25 mg via ORAL
  Filled 2022-01-25 (×6): qty 1

## 2022-01-25 MED ORDER — IPRATROPIUM-ALBUTEROL 0.5-2.5 (3) MG/3ML IN SOLN
3.0000 mL | Freq: Two times a day (BID) | RESPIRATORY_TRACT | Status: DC
  Administered 2022-01-25 – 2022-01-27 (×4): 3 mL via RESPIRATORY_TRACT
  Filled 2022-01-25 (×4): qty 3

## 2022-01-25 NOTE — Progress Notes (Signed)
RT placed pt on BIPAP at 20/10 50%. Pt getting great volumes and is tolerating well at this time, VSV. RT will continue to monitor as needed throughout night. ?

## 2022-01-25 NOTE — Progress Notes (Signed)
?  Mobility Specialist Criteria Algorithm Info. ? ? 01/25/22 1255  ?Mobility  ?Activity Transferred from chair to bed  ?Range of Motion/Exercises Active;All extremities  ?Level of Assistance +2 (takes two people)  ?Assistive Device Other (Comment) ?(HHA)  ?Distance Ambulated (ft) 5 ft  ?Activity Response Tolerated well  ? ?Patient received in recliner requesting assistance back to bed. Required +2 HHA to stand and take step back to bed. Required min A to descend from stand > sit requiring cues to for hand placement on bed. Was independent to get LE's back into bed only requiring assistance with trunk positioning. Was left with all needs met, call bell in reach.  ? ?01/25/2022 ?4:36 PM ? ?[place] Jarad Barth, CMS, BS EXP ?Acute Rehabilitation Services  ?VGCYO:[phone] ?Office: [phone] ? ?

## 2022-01-25 NOTE — Plan of Care (Signed)

## 2022-01-25 NOTE — Evaluation (Signed)
Physical Therapy Evaluation ?Patient Details ?Name: Jeffrey Skinner ?MRN: [id number] ?DOB: September 04, 1956 ?Today's Date: 01/25/2022 ? ?History of Present Illness ? Pt is a 66 y/o male admitted secondary to fall out of bed and unresponsiveness. Found to have respiratory failure. PMH includes CHF, OSA, Obesity, repiratory failure on 3L O2.  ?Clinical Impression ? Pt admitted secondary to problem above with deficits below. Pt fatiguing easily, but was able to take steps to chair. Min A for steadying with bilat HHA. Pt reports normally he spends majority of time in bed, but is able to ambulate short distances around the house. If pt progresses well, feel he would benefit from HHPT. However, if pt does not progress well, may need to consider post acute rehab. Will continue to follow acutely.    ?   ? ?Recommendations for follow up therapy are one component of a multi-disciplinary discharge planning process, led by the attending physician.  Recommendations may be updated based on patient status, additional functional criteria and insurance authorization. ? ?Follow Up Recommendations Home health PT (pending progression) ? ?  ?Assistance Recommended at Discharge Frequent or constant Supervision/Assistance  ?Patient can return home with the following ? A little help with walking and/or transfers;A little help with bathing/dressing/bathroom;Assistance with cooking/housework;Help with stairs or ramp for entrance;Assist for transportation ? ?  ?Equipment Recommendations Other (comment) (bariatric RW)  ?Recommendations for Other Services ?    ?  ?Functional Status Assessment Patient has had a recent decline in their functional status and demonstrates the ability to make significant improvements in function in a reasonable and predictable amount of time.  ? ?  ?Precautions / Restrictions Precautions ?Precautions: Fall ?Precaution Comments: watch O2 ?Restrictions ?Weight Bearing Restrictions: No  ? ?  ? ?Mobility ? Bed Mobility ?Overal bed  mobility: Needs Assistance ?Bed Mobility: Supine to Sit ?  ?  ?Supine to sit: Min assist, +2 for safety/equipment ?  ?  ?General bed mobility comments: Required assist for trunk and to scoot hips to EOB ?  ? ?Transfers ?Overall transfer level: Needs assistance ?Equipment used: 2 person hand held assist ?Transfers: Sit to/from Stand ?Sit to Stand: Min assist, +2 physical assistance, +2 safety/equipment ?  ?  ?  ?  ?  ?General transfer comment: Min A for lift assist ?  ? ?Ambulation/Gait ?Ambulation/Gait assistance: Min assist, +2 physical assistance, +2 safety/equipment ?Gait Distance (Feet): 5 Feet ?Assistive device: 2 person hand held assist ?Gait Pattern/deviations: Step-through pattern, Decreased stride length ?Gait velocity: Decreased ?  ?  ?General Gait Details: Min A for steadying to take steps in the room. Waddle type gait and fatigued very easily. ? ?Stairs ?  ?  ?  ?  ?  ? ?Wheelchair Mobility ?  ? ?Modified Rankin (Stroke Patients Only) ?  ? ?  ? ?Balance Overall balance assessment: Needs assistance ?Sitting-balance support: No upper extremity supported ?Sitting balance-Leahy Scale: Fair ?  ?  ?Standing balance support: Bilateral upper extremity supported ?Standing balance-Leahy Scale: Poor ?Standing balance comment: Reliant on UE and external support ?  ?  ?  ?  ?  ?  ?  ?  ?  ?  ?  ?   ? ? ? ?Pertinent Vitals/Pain Pain Assessment ?Pain Assessment: No/denies pain  ? ? ?Home Living Family/patient expects to be discharged to:: Private residence ?Living Arrangements: Spouse/significant other;Children ?Available Help at Discharge: Family ?Type of Home: House ?Home Access: Ramped entrance ?  ?  ?  ?Home Layout: One level ?Home Equipment: None ?   ?  ?  Prior Function Prior Level of Function : Independent/Modified Independent ?  ?  ?  ?  ?  ?  ?Mobility Comments: Only ambulates short distance. Spends majority of time in bed ?  ?  ? ? ?Hand Dominance  ?   ? ?  ?Extremity/Trunk Assessment  ? Upper Extremity  Assessment ?Upper Extremity Assessment: Defer to OT evaluation ?  ? ?Lower Extremity Assessment ?Lower Extremity Assessment: Generalized weakness ?  ? ?Cervical / Trunk Assessment ?Cervical / Trunk Assessment: Other exceptions ?Cervical / Trunk Exceptions: increased body habitus  ?Communication  ? Communication: No difficulties  ?Cognition Arousal/Alertness: Awake/alert ?Behavior During Therapy: [facility] for tasks assessed/performed ?Overall Cognitive Status: Within Functional Limits for tasks assessed ?  ?  ?  ?  ?  ?  ?  ?  ?  ?  ?  ?  ?  ?  ?  ?  ?  ?  ?  ? ?  ?General Comments   ? ?  ?Exercises    ? ?Assessment/Plan  ?  ?PT Assessment Patient needs continued PT services  ?PT Problem List Decreased strength;Decreased activity tolerance;Decreased balance;Decreased mobility;Decreased knowledge of use of DME;Decreased knowledge of precautions ? ?   ?  ?PT Treatment Interventions Gait training;DME instruction;Functional mobility training;Therapeutic activities;Patient/family education;Balance training;Therapeutic exercise   ? ?PT Goals (Current goals can be found in the Care Plan section)  ?Acute Rehab PT Goals ?Patient Stated Goal: to go home ?PT Goal Formulation: With patient ?Time For Goal Achievement: 02/08/22 ?Potential to Achieve Goals: Fair ? ?  ?Frequency Min 3X/week ?  ? ? ?Co-evaluation   ?  ?  ?  ?  ? ? ?  ?AM-PAC PT "6 Clicks" Mobility  ?Outcome Measure Help needed turning from your back to your side while in a flat bed without using bedrails?: A Little ?Help needed moving from lying on your back to sitting on the side of a flat bed without using bedrails?: A Little ?Help needed moving to and from a bed to a chair (including a wheelchair)?: A Little ?Help needed standing up from a chair using your arms (e.g., wheelchair or bedside chair)?: A Little ?Help needed to walk in hospital room?: A Little ?Help needed climbing 3-5 steps with a railing? : Total ?6 Click Score: 16 ? ?  ?End of Session Equipment Utilized  During Treatment: Gait belt ?Activity Tolerance: Patient limited by fatigue ?Patient left: in chair;with call bell/phone within reach ?Nurse Communication: Mobility status ?PT Visit Diagnosis: Unsteadiness on feet (R26.81);Muscle weakness (generalized) (M62.81) ?  ? ?Time: 7893-8101 ?PT Time Calculation (min) (ACUTE ONLY): 31 min ? ? ?Charges:   PT Evaluation ?$PT Eval Moderate Complexity: 1 Mod ?PT Treatments ?$Therapeutic Activity: 8-22 mins ?  ?   ? ? ?Farley Ly, PT, DPT  ?Acute Rehabilitation Services  ?Pager: [phone] ?Office: [phone] ? ? ?[place] S Vinicio Lynk ?01/25/2022, 1:34 PM ? ?

## 2022-01-25 NOTE — Progress Notes (Signed)
?PROGRESS NOTE ? ? ? ?Jeffrey Skinner  ZOX:[id number] DOB: 1956-01-19 DOA: 01/23/2022 ?PCP: Barbette MerinoKing, Crystal M, NP  ?66/male with history of severe obstructive sleep apnea, chronic respiratory failure, supposed to be on BiPAP 22/18 and 3 L home O2, morbid obesity, BMI of 62, chronic diastolic CHF, anxiety, depression, arthritis was brought to the ED after he rolled out of bed, was found down confused. ?-In the ED his O2 sats were 64% on 4 L with encephalopathy, ABG noted severe respiratory acidosis with PCO2> 110, admitted to the ICU, started on BiPAP, IV Lasix ?-[facility] ? ?Subjective: ?-Feels better, no events overnight, used BiPAP last night, he tells me he does not have a machine at home ? ?Assessment and Plan: ? ?Acute on chronic hypoxic and hypercarbic respiratory failure ?Acute on chronic diastolic CHF ?Severe OSA ?-He does not have a CPAP/BiPAP at home and was also volume overloaded on admission ?-Improving on BiPAP, diuretics ?-Echo with preserved EF,, normal RV size and function noted, likely poor quality due size ?-Continue IV Lasix today, add Aldactone ?-Increase activity, PT OT eval ?-TOC consulted for home BiPAP ? ?Severe OSA ?-Supposed to be on BiPAP 22/18 with 3 L O2 at baseline ?-See discussion above, TOC consulted for home BiPAP ? ?Morbid obesity ?-BMI 62, poor prognostic factor ? ?Hyperglycemia, borderline diabetes ?HbA1c is 5.7 consistent with borderline diabetes ? ?Hypertension ?-Losartan on hold ? ?DVT prophylaxis: Heparin subcutaneous ?Code Status: Full code ?Family Communication: Discussed with patient in detail, no family at bedside ?Disposition Plan: Home likely 48 hours ? ?Consultants:  ? ? ?Procedures:  ? ?Antimicrobials:  ? ? ?Objective: ?Vitals:  ? 01/25/22 [id number] 01/25/22 [id number] 01/25/22 0813 01/25/22 [id number]  ?BP: (!) 117/59  (!) 107/58   ?Pulse: 77  85 82  ?Resp: 19  19 18   ?Temp: 99.1 ?F (37.3 ?C)  98 ?F (36.7 ?C)   ?TempSrc: Oral  Oral   ?SpO2: 93%  94%  97%  ?Weight:  (!) 236.3 kg    ?Height:      ? ? ?Intake/Output Summary (Last 24 hours) at 01/25/2022 0946 ?Last data filed at 01/25/2022 0800 ?Gross per 24 hour  ?Intake 392.34 ml  ?Output 3425 ml  ?Net -3032.66 ml  ? ?Filed Weights  ? 01/23/22 [id number] 01/24/22 2300 01/25/22 0552  ?Weight: (!) 227.3 kg (!) 238.1 kg (!) 236.3 kg  ? ? ?Examination: ? ?General exam: Morbidly obese chronically ill male sitting up in bed, AAOx3, no distress ?HEENT: Neck obese unable to assess JVD ?CVS: S1-S2, regular rate rhythm ?Lungs: Distant breath sounds, decreased at the bases ?Abdomen: Soft, obese, nontender, bowel sounds present ?Extremities: 1+ edema, chronic venous stasis and thickened skin ?Psychiatry:  Mood & affect appropriate.  ? ? ? ?Data Reviewed:  ? ?CBC: ?Recent Labs  ?Lab 01/23/22 ?0950 01/23/22 ?1006 01/24/22 ?0145 01/24/22 ?[id number]  ?WBC  --  9.4  --  8.1  ?NEUTROABS  --  7.3  --   --   ?HGB 16.3 13.2 14.6 12.8*  ?HCT 48.0 48.4 43.0 44.6  ?MCV  --  105.9*  --  103.0*  ?PLT  --  176  --  167  ? ?Basic Metabolic Panel: ?Recent Labs  ?Lab 01/23/22 ?0950 01/23/22 ?1006 01/24/22 ?0145 01/24/22 ?0647 01/24/22 ?1712 01/25/22 ?0050  ?NA 139 141 141 143 142  --   ?K 5.0 4.7 4.6 4.6 4.2  --   ?CL  --  94*  --  93* 90*  --   ?  CO2  --  40*  --  44* >45*  --   ?GLUCOSE  --  126*  --  99 108*  --   ?BUN  --  8  --  10 11  --   ?CREATININE  --  0.94  --  0.98 1.07  --   ?CALCIUM  --  7.9*  --  8.0* 7.8*  --   ?MG  --   --   --   --   --  1.7  ? ?GFR: ?Estimated Creatinine Clearance: 146 mL/min (by C-G formula based on SCr of 1.07 mg/dL). ?Liver Function Tests: ?Recent Labs  ?Lab 01/23/22 ?1006  ?AST 23  ?ALT 15  ?ALKPHOS 59  ?BILITOT 0.8  ?PROT 7.7  ?ALBUMIN 3.2*  ? ?No results for input(s): LIPASE, AMYLASE in the last 168 hours. ?No results for input(s): AMMONIA in the last 168 hours. ?Coagulation Profile: ?No results for input(s): INR, PROTIME in the last 168 hours. ?Cardiac Enzymes: ?No results for input(s): CKTOTAL, CKMB, CKMBINDEX,  TROPONINI in the last 168 hours. ?BNP (last 3 results) ?No results for input(s): PROBNP in the last 8760 hours. ?HbA1C: ?Recent Labs  ?  01/23/22 ?1006  ?HGBA1C 5.7*  ? ?CBG: ?Recent Labs  ?Lab 01/24/22 ?1600 01/24/22 ?1943 01/24/22 ?2320 01/25/22 ?0412 01/25/22 ?0815  ?GLUCAP 114* 134* 142* 109* 168*  ? ?Lipid Profile: ?No results for input(s): CHOL, HDL, LDLCALC, TRIG, CHOLHDL, LDLDIRECT in the last 72 hours. ?Thyroid Function Tests: ?No results for input(s): TSH, T4TOTAL, FREET4, T3FREE, THYROIDAB in the last 72 hours. ?Anemia Panel: ?No results for input(s): VITAMINB12, FOLATE, FERRITIN, TIBC, IRON, RETICCTPCT in the last 72 hours. ?Urine analysis: ?   ?Component Value Date/Time  ? COLORURINE AMBER (A) 01/23/2022 1249  ? APPEARANCEUR HAZY (A) 01/23/2022 1249  ? LABSPEC 1.016 01/23/2022 1249  ? PHURINE 5.0 01/23/2022 1249  ? GLUCOSEU NEGATIVE 01/23/2022 1249  ? HGBUR NEGATIVE 01/23/2022 1249  ? BILIRUBINUR NEGATIVE 01/23/2022 1249  ? KETONESUR 5 (A) 01/23/2022 1249  ? PROTEINUR 30 (A) 01/23/2022 1249  ? NITRITE NEGATIVE 01/23/2022 1249  ? LEUKOCYTESUR NEGATIVE 01/23/2022 1249  ? ?Sepsis Labs: ?@[email or web address] (procalcitonin:4,lacticidven:4) ? ?) ?Recent Results (from the past 240 hour(s))  ?MRSA Next Gen by PCR, Nasal     Status: None  ? Collection Time: 01/23/22  9:39 AM  ? Specimen: Nasal Mucosa; Nasal Swab  ?Result Value Ref Range Status  ? MRSA by PCR Next Gen NOT DETECTED NOT DETECTED Final  ?  Comment: (NOTE) ?The GeneXpert MRSA Assay (FDA approved for NASAL specimens only), ?is one component of a comprehensive MRSA colonization surveillance ?program. It is not intended to diagnose MRSA infection nor to guide ?or monitor treatment for MRSA infections. ?Test performance is not FDA approved in patients less than 2 years ?old. ?Performed at [facility], [address] [place], [place] ?[place] ?  ?  ? ?Radiology Studies: ?DG Chest Port 1 View ? ?Result Date: 01/24/2022 ?CLINICAL DATA:  Pulmonary edema EXAM:  PORTABLE CHEST 1 VIEW COMPARISON:  Chest radiograph one day prior FINDINGS: The heart and mediastinal contours are enlarged, unchanged. There is vascular congestion with increased interstitial markings bilaterally consistent with pulmonary edema, stable to minimally improved compared to the study from 1 day prior. There is no new or worsening focal airspace disease. There is no large pleural effusion. There is no pneumothorax There is no acute osseous abnormality. IMPRESSION: Increased interstitial markings bilaterally likely reflecting pulmonary edema, stable to slightly improved compared to the  study from 1 day prior. No new or worsening focal airspace disease. No large pleural effusion. Electronically Signed   By: Lesia Hausen M.D.   On: 01/24/2022 08:09  ? ?DG Chest Port 1 View ? ?Result Date: 01/23/2022 ?CLINICAL DATA:  Short of breath.  Altered mental status EXAM: PORTABLE CHEST 1 VIEW COMPARISON:  01/01/2022 FINDINGS: To low volume portable radiographs, limited by patient body habitus. Numerous leads and wires project over the chest. Patient rotated right. Mild cardiomegaly. No pleural effusion or pneumothorax. Development of diffuse interstitial and airspace disease bilaterally. IMPRESSION: Mildly limited portable radiograph, as detailed above. Development of diffuse interstitial and airspace disease. Pulmonary edema favored over multifocal infection. Cardiomegaly. Electronically Signed   By: Jeronimo Greaves M.D.   On: 01/23/2022 10:21  ? ?ECHOCARDIOGRAM COMPLETE ? ?Result Date: 01/24/2022 ?   ECHOCARDIOGRAM REPORT   Patient Name:   KYLIL SWOPES Date of Exam: 01/23/2022 Medical Rec #:  [id number]     Height:       76.0 in Accession #:    [id number]    Weight:       501.1 lb Date of Birth:  October 20, 1955     BSA:          3.273 m? Patient Age:    66 years      BP:           119/68 mmHg Patient Gender: M             HR:           76 bpm. Exam Location:  Inpatient Procedure: 2D Echo, Cardiac Doppler, Color Doppler and  Intracardiac            Opacification Agent Indications:    Congestive heart failure  History:        Patient has prior history of Echocardiogram examinations, most                 recent 01/01/2021. CHF; Ri

## 2022-01-26 DIAGNOSIS — I509 Heart failure, unspecified: Secondary | ICD-10-CM | POA: Diagnosis not present

## 2022-01-26 DIAGNOSIS — I5031 Acute diastolic (congestive) heart failure: Secondary | ICD-10-CM | POA: Diagnosis not present

## 2022-01-26 LAB — GLUCOSE, CAPILLARY
Glucose-Capillary: 142 mg/dL — ABNORMAL HIGH (ref 70–99)
Glucose-Capillary: 136 mg/dL — ABNORMAL HIGH (ref 70–99)

## 2022-01-26 LAB — BASIC METABOLIC PANEL
Anion gap: 6 (ref 5–15)
BUN: 10 mg/dL (ref 8–23)
CO2: 44 mmol/L — ABNORMAL HIGH (ref 22–32)
Calcium: 8.2 mg/dL — ABNORMAL LOW (ref 8.9–10.3)
Chloride: 90 mmol/L — ABNORMAL LOW (ref 98–111)
Creatinine, Ser: 0.85 mg/dL (ref 0.61–1.24)
GFR, Estimated: >60 mL/min (ref >60)
Glucose, Bld: 135 mg/dL — ABNORMAL HIGH (ref 70–99)
Potassium: 3.9 mmol/L (ref 3.5–5.1)
Sodium: 140 mmol/L (ref 135–145)

## 2022-01-26 LAB — MAGNESIUM: Magnesium: 1.7 mg/dL (ref 1.7–2.4)

## 2022-01-26 NOTE — Progress Notes (Signed)
PT Cancellation Note ? ?Patient Details ?Name: Jeffrey Skinner ?MRN: [id number] ?DOB: August 29, 1956 ? ? ?Cancelled Treatment:    Reason Eval/Treat Not Completed: Other (comment). Pt currently eating dinner. Will plan to follow-up another day as able. ? ? ?Raymond Gurney, PT, DPT ?Acute Rehabilitation Services  ?Pager: [phone] ?Office: [phone] ? ? ? ?[name] ?01/26/2022, 4:49 PM ? ? ?

## 2022-01-26 NOTE — Plan of Care (Signed)

## 2022-01-26 NOTE — Progress Notes (Signed)
?PROGRESS NOTE ? ? ? ?Jeffrey Skinner  JSE:[id number] DOB: 16-May-1956 DOA: 01/23/2022 ?PCP: Barbette Merino, NP  ?66/male with history of severe obstructive sleep apnea, chronic respiratory failure, supposed to be on BiPAP 22/18 and 3 L home O2, morbid obesity, BMI of 62, chronic diastolic CHF, anxiety, depression, arthritis was brought to the ED after he rolled out of bed, was found down confused. ?-In the ED his O2 sats were 64% on 4 L with encephalopathy, ABG noted severe respiratory acidosis with PCO2> 110, admitted to the ICU, started on BiPAP, IV Lasix ?-Improving, transferred to [facility] service  4/28 ? ?Subjective: ?-Feels better overall, used BiPAP last night ? ?Assessment and Plan: ? ?Acute on chronic hypoxic and hypercarbic respiratory failure ?Acute on chronic diastolic CHF ?Severe OSA ?-He does not have a CPAP/BiPAP at home and was also volume overloaded on admission ?-Improving on BiPAP, diuretics ?-Echo with preserved EF,, normal RV size and function noted, likely poor quality due size ?-Continue IV Lasix today, Aldactone, he is 4.8 L negative ?-Increase activity as tolerated ?-TOC consulted for home BiPAP ? ?Severe OSA ?-Supposed to be on BiPAP 22/18 with 3 L O2 at baseline ?-See discussion above, TOC consulted for home BiPAP ? ?Morbid obesity ?-BMI 62, poor prognostic factor ? ?Hyperglycemia, borderline diabetes ?HbA1c is 5.7 consistent with borderline diabetes ? ?Hypertension ?-Losartan on hold ? ?DVT prophylaxis: Heparin subcutaneous ?Code Status: Full code ?Family Communication: Discussed with patient in detail, no family at bedside ?Disposition Plan: Home likely 48 hours ? ?Consultants:  ? ? ?Procedures:  ? ?Antimicrobials:  ? ? ?Objective: ?Vitals:  ? 01/26/22 0321 01/26/22 0737 01/26/22 1062 01/26/22 0835  ?BP: (!) 112/57  120/72   ?Pulse: 78  83 78  ?Resp: 20  18 17   ?Temp: 98.1 ?F (36.7 ?C)  97.9 ?F (36.6 ?C)   ?TempSrc: Oral  Oral   ?SpO2: 96%  92% 93%  ?Weight:  (!) 235 kg    ?Height:       ? ? ?Intake/Output Summary (Last 24 hours) at 01/26/2022 0949 ?Last data filed at 01/26/2022 0800 ?Gross per 24 hour  ?Intake 1200 ml  ?Output 2300 ml  ?Net -1100 ml  ? ?Filed Weights  ? 01/24/22 2300 01/25/22 0552 01/26/22 0432  ?Weight: (!) 238.1 kg (!) 236.3 kg (!) 235 kg  ? ? ?Examination: ? ?General exam: Obese chronically ill male sitting up in bed, AAOx3, no distress ?HEENT: Neck obese unable to assess JVD ?CVS: S1-S2, regular rhythm ?Lungs: Distant breath sounds, decreased at the bases ?Abdomen: Soft, obese, nontender, bowel sounds present  ?Extremities: 1+ edema, chronic venous stasis and thickened skin ?Psychiatry:  Mood & affect appropriate.  ? ? ? ?Data Reviewed:  ? ?CBC: ?Recent Labs  ?Lab 01/23/22 ?0950 01/23/22 ?1006 01/24/22 ?0145 01/24/22 ?01/26/22  ?WBC  --  9.4  --  8.1  ?NEUTROABS  --  7.3  --   --   ?HGB 16.3 13.2 14.6 12.8*  ?HCT 48.0 48.4 43.0 44.6  ?MCV  --  105.9*  --  103.0*  ?PLT  --  176  --  167  ? ?Basic Metabolic Panel: ?Recent Labs  ?Lab 01/23/22 ?1006 01/24/22 ?0145 01/24/22 ?0647 01/24/22 ?1712 01/25/22 ?0050 01/26/22 ?01/28/22  ?NA 141 141 143 142 140 140  ?K 4.7 4.6 4.6 4.2 4.3 3.9  ?CL 94*  --  93* 90* 91* 90*  ?CO2 40*  --  44* >45* >45* 44*  ?GLUCOSE 126*  --  99 108* 113* 135*  ?  BUN 8  --  10 11 10 10   ?CREATININE 0.94  --  0.98 1.07 0.91 0.85  ?CALCIUM 7.9*  --  8.0* 7.8* 7.9* 8.2*  ?MG  --   --   --   --  1.7 1.7  ? ?GFR: ?Estimated Creatinine Clearance: 183.2 mL/min (by C-G formula based on SCr of 0.85 mg/dL). ?Liver Function Tests: ?Recent Labs  ?Lab 01/23/22 ?1006  ?AST 23  ?ALT 15  ?ALKPHOS 59  ?BILITOT 0.8  ?PROT 7.7  ?ALBUMIN 3.2*  ? ?No results for input(s): LIPASE, AMYLASE in the last 168 hours. ?No results for input(s): AMMONIA in the last 168 hours. ?Coagulation Profile: ?No results for input(s): INR, PROTIME in the last 168 hours. ?Cardiac Enzymes: ?No results for input(s): CKTOTAL, CKMB, CKMBINDEX, TROPONINI in the last 168 hours. ?BNP (last 3 results) ?No results for  input(s): PROBNP in the last 8760 hours. ?HbA1C: ?Recent Labs  ?  01/23/22 ?1006  ?HGBA1C 5.7*  ? ?CBG: ?Recent Labs  ?Lab 01/25/22 ?1607 01/25/22 ?1946 01/25/22 ?2351 01/26/22 ?0319 01/26/22 ?01/28/22  ?GLUCAP 120* 147* 139* 142* 136*  ? ?Lipid Profile: ?No results for input(s): CHOL, HDL, LDLCALC, TRIG, CHOLHDL, LDLDIRECT in the last 72 hours. ?Thyroid Function Tests: ?No results for input(s): TSH, T4TOTAL, FREET4, T3FREE, THYROIDAB in the last 72 hours. ?Anemia Panel: ?No results for input(s): VITAMINB12, FOLATE, FERRITIN, TIBC, IRON, RETICCTPCT in the last 72 hours. ?Urine analysis: ?   ?Component Value Date/Time  ? COLORURINE AMBER (A) 01/23/2022 1249  ? APPEARANCEUR HAZY (A) 01/23/2022 1249  ? LABSPEC 1.016 01/23/2022 1249  ? PHURINE 5.0 01/23/2022 1249  ? GLUCOSEU NEGATIVE 01/23/2022 1249  ? HGBUR NEGATIVE 01/23/2022 1249  ? BILIRUBINUR NEGATIVE 01/23/2022 1249  ? KETONESUR 5 (A) 01/23/2022 1249  ? PROTEINUR 30 (A) 01/23/2022 1249  ? NITRITE NEGATIVE 01/23/2022 1249  ? LEUKOCYTESUR NEGATIVE 01/23/2022 1249  ? ?Sepsis Labs: ?@[email or web address] (procalcitonin:4,lacticidven:4) ? ?) ?Recent Results (from the past 240 hour(s))  ?MRSA Next Gen by PCR, Nasal     Status: None  ? Collection Time: 01/23/22  9:39 AM  ? Specimen: Nasal Mucosa; Nasal Swab  ?Result Value Ref Range Status  ? MRSA by PCR Next Gen NOT DETECTED NOT DETECTED Final  ?  Comment: (NOTE) ?The GeneXpert MRSA Assay (FDA approved for NASAL specimens only), ?is one component of a comprehensive MRSA colonization surveillance ?program. It is not intended to diagnose MRSA infection nor to guide ?or monitor treatment for MRSA infections. ?Test performance is not FDA approved in patients less than 2 years ?old. ?Performed at [facility], [address] [place], [address] ?[place] ?  ?  ? ?Radiology Studies: ?No results found. ? ? ?Scheduled Meds: ? chlorhexidine  15 mL Mouth Rinse BID  ? furosemide  80 mg Intravenous Q12H  ? heparin  7,500 Units Subcutaneous Q8H   ? insulin aspart  0-9 Units Subcutaneous Q4H  ? ipratropium-albuterol  3 mL Nebulization BID  ? mouth rinse  15 mL Mouth Rinse q12n4p  ? spironolactone  25 mg Oral Daily  ? ?Continuous Infusions: ? sodium chloride 10 mL/hr at 01/24/22 2200  ? ? ? LOS: 3 days  ? ? ?Time spent: 20919 ? ?01/26/22, MD ?[facility] ? ? ?01/26/2022, 9:49 AM  ?  ?

## 2022-01-26 NOTE — Evaluation (Signed)
Occupational Therapy Evaluation ?Patient Details ?Name: Jeffrey Skinner ?MRN: [id number] ?DOB: 01/30/1956 ?Today's Date: 01/26/2022 ? ? ?History of Present Illness Pt is a 66 y/o male admitted secondary to fall out of bed and unresponsiveness. Found to have respiratory failure. PMH includes CHF, OSA, Obesity, repiratory failure on 3L O2.  ? ?Clinical Impression ?  ?PTA, pt reports he spends most of his time in bed but does get out of bed daily. Pt reports that he feels short of breath and has to hurry when leaving his house to go to his truck, and occasionally feels dizzy when doing so. Discussed fall prevention strategies during longer distance mobility, pt reports he has an Transport planner. Pt currently requires supervision for bed mobility and minguard-minA for functional mobility. He requires modA for LB ADL and setup assistance for grooming while sitting EOB. Pt on 3lnc throughout session, poor waveform during mobility but pt asymptomatic. Pt will continue to benefit from skilled OT services to maximize safety and independence with ADL/IADL and functional mobility. Will continue to follow acutely and progress as tolerated.  ?  ?   ? ?Recommendations for follow up therapy are one component of a multi-disciplinary discharge planning process, led by the attending physician.  Recommendations may be updated based on patient status, additional functional criteria and insurance authorization.  ? ?Follow Up Recommendations ? Home health OT  ?  ?Assistance Recommended at Discharge Intermittent Supervision/Assistance  ?Patient can return home with the following A little help with walking and/or transfers;A little help with bathing/dressing/bathroom;Assistance with cooking/housework ? ?  ?Functional Status Assessment ? Patient has had a recent decline in their functional status and demonstrates the ability to make significant improvements in function in a reasonable and predictable amount of time.  ?Equipment  Recommendations ? Tub/shower bench;BSC/3in1;Other (comment) (bariatric size DME)  ?  ?Recommendations for Other Services   ? ? ?  ?Precautions / Restrictions Precautions ?Precautions: Fall ?Precaution Comments: watch O2 ?Restrictions ?Weight Bearing Restrictions: No  ? ?  ? ?Mobility Bed Mobility ?Overal bed mobility: Needs Assistance ?Bed Mobility: Supine to Sit, Sit to Supine ?  ?  ?Supine to sit: Supervision ?Sit to supine: Supervision ?  ?General bed mobility comments: no physical assistance provided ?  ? ?Transfers ?Overall transfer level: Needs assistance ?Equipment used: 1 person hand held assist ?Transfers: Sit to/from Stand ?Sit to Stand: Min assist ?  ?  ?  ?  ?  ?General transfer comment: minA for stability in standing, pt progressed to be able to stand independently with use of bed rail. limited standing tolerance ~30seconds ?  ? ?  ?Balance Overall balance assessment: Needs assistance ?Sitting-balance support: No upper extremity supported ?Sitting balance-Leahy Scale: Fair ?  ?  ?Standing balance support: Single extremity supported ?Standing balance-Leahy Scale: Poor ?Standing balance comment: Reliant on UE and external support ?  ?  ?  ?  ?  ?  ?  ?  ?  ?  ?  ?   ? ?ADL either performed or assessed with clinical judgement  ? ?ADL Overall ADL's : Needs assistance/impaired ?Eating/Feeding: Set up;Sitting ?  ?Grooming: Set up;Sitting ?Grooming Details (indicate cue type and reason): completed sitting EOB ?Upper Body Bathing: Set up;Sitting ?  ?Lower Body Bathing: Minimal assistance;Sit to/from stand ?Lower Body Bathing Details (indicate cue type and reason): assist to access bilateral feet ?Upper Body Dressing : Set up;Sitting ?  ?Lower Body Dressing: Minimal assistance;Sit to/from stand ?Lower Body Dressing Details (indicate cue type and reason): assist to  don over feet ?Toilet Transfer: Min guard ?Toilet Transfer Details (indicate cue type and reason): lateral side stepping along EOB ?Toileting-  Clothing Manipulation and Hygiene: Sit to/from stand;Minimal assistance ?Toileting - Clothing Manipulation Details (indicate cue type and reason): assist for thorough cleaning ?  ?  ?Functional mobility during ADLs: Min guard ?General ADL Comments: pt limited by decreased activity tolerance, instability, cardiopulmonary limitations  ? ? ? ?Vision   ?   ?   ?Perception   ?  ?Praxis   ?  ? ?Pertinent Vitals/Pain Pain Assessment ?Pain Assessment: No/denies pain  ? ? ? ?Hand Dominance Right ?  ?Extremity/Trunk Assessment Upper Extremity Assessment ?Upper Extremity Assessment: Overall WFL for tasks assessed ?  ?Lower Extremity Assessment ?Lower Extremity Assessment: Defer to PT evaluation ?  ?Cervical / Trunk Assessment ?Cervical / Trunk Assessment: Other exceptions ?Cervical / Trunk Exceptions: increased body habitus ?  ?Communication Communication ?Communication: No difficulties ?  ?Cognition Arousal/Alertness: Awake/alert ?Behavior During Therapy: [facility] for tasks assessed/performed ?Overall Cognitive Status: Within Functional Limits for tasks assessed ?  ?  ?  ?  ?  ?  ?  ?  ?  ?  ?  ?  ?  ?  ?  ?  ?  ?  ?  ?General Comments  SpO2 with poor wave form throughout session, appeared to have fair wave form around 89% on 3lnc ? ?  ?Exercises   ?  ?Shoulder Instructions    ? ? ?Home Living Family/patient expects to be discharged to:: Private residence ?Living Arrangements: Spouse/significant other;Children ?Available Help at Discharge: Family ?Type of Home: House ?Home Access: Ramped entrance ?  ?  ?Home Layout: One level ?  ?  ?Bathroom Shower/Tub: Sponge bathes at baseline ?  ?Bathroom Toilet: Standard ?  ?  ?Home Equipment: Electric scooter ?  ?  ?  ? ?  ?Prior Functioning/Environment Prior Level of Function : Independent/Modified Independent ?  ?  ?  ?  ?  ?  ?Mobility Comments: Only ambulates short distance. Spends majority of time in bed ?ADLs Comments: reports sponge bathing in bed, and reports otherwise independent  with ADL ?  ? ?  ?  ?OT Problem List: Decreased activity tolerance;Impaired balance (sitting and/or standing);Cardiopulmonary status limiting activity ?  ?   ?OT Treatment/Interventions:    ?  ?OT Goals(Current goals can be found in the care plan section) Acute Rehab OT Goals ?Patient Stated Goal: to be able to wash his own back ?OT Goal Formulation: With patient ?Time For Goal Achievement: 02/09/22 ?Potential to Achieve Goals: Good ?ADL Goals ?Pt Will Perform Lower Body Dressing: with set-up;sit to/from stand;with adaptive equipment ?Pt Will Transfer to Toilet: with supervision;ambulating ?Additional ADL Goal #1: Pt will use AE to independently wash his back and BLE. ?Additional ADL Goal #2: Pt will demonstrate independence with 3 fall prevention strategies for safe engagement in ADL/IADL and functional mobiltiy.  ?OT Frequency: Min 2X/week ?  ? ?Co-evaluation   ?  ?  ?  ?  ? ?  ?AM-PAC OT "6 Clicks" Daily Activity     ?Outcome Measure Help from another person eating meals?: None ?Help from another person taking care of personal grooming?: A Little ?Help from another person toileting, which includes using toliet, bedpan, or urinal?: A Little ?Help from another person bathing (including washing, rinsing, drying)?: A Little ?Help from another person to put on and taking off regular upper body clothing?: A Little ?Help from another person to put on and taking off regular  lower body clothing?: A Lot ?6 Click Score: 18 ?  ?End of Session Equipment Utilized During Treatment: Oxygen ?Nurse Communication: Mobility status ? ?Activity Tolerance: Patient tolerated treatment well ?Patient left: in bed;with call bell/phone within reach ? ?OT Visit Diagnosis: Unsteadiness on feet (R26.81);Other abnormalities of gait and mobility (R26.89);Muscle weakness (generalized) (M62.81)  ?              ?Time: [id number] ?OT Time Calculation (min): 49 min ?Charges:  OT General Charges ?$OT Visit: 1 Visit ?OT Evaluation ?$OT Eval Moderate  Complexity: 1 Mod ?OT Treatments ?$Self Care/Home Management : 23-37 mins ? ?Helene Kelp OTR/L ?Acute Rehabilitation Services ?Office: [phone] ? ? ?Wyn Forster ?01/26/2022, 4:22 PM ?

## 2022-01-26 NOTE — Progress Notes (Signed)
Patients primofit continues to leak, has been changed 2x tonight. Patient has soaked bed/bed pad x2 with a small amount of loose stool. Unfortunately unable to get accurate I&O's.  ?

## 2022-01-27 DIAGNOSIS — I5031 Acute diastolic (congestive) heart failure: Secondary | ICD-10-CM | POA: Diagnosis not present

## 2022-01-27 DIAGNOSIS — I509 Heart failure, unspecified: Secondary | ICD-10-CM | POA: Diagnosis not present

## 2022-01-27 LAB — BASIC METABOLIC PANEL
Anion gap: 8 (ref 5–15)
BUN: 11 mg/dL (ref 8–23)
CO2: 44 mmol/L — ABNORMAL HIGH (ref 22–32)
Calcium: 8.6 mg/dL — ABNORMAL LOW (ref 8.9–10.3)
Chloride: 88 mmol/L — ABNORMAL LOW (ref 98–111)
Creatinine, Ser: 0.83 mg/dL (ref 0.61–1.24)
GFR, Estimated: >60 mL/min (ref >60)
Glucose, Bld: 130 mg/dL — ABNORMAL HIGH (ref 70–99)
Potassium: 3.8 mmol/L (ref 3.5–5.1)
Sodium: 140 mmol/L (ref 135–145)

## 2022-01-27 LAB — MAGNESIUM: Magnesium: 1.8 mg/dL (ref 1.7–2.4)

## 2022-01-27 MED ORDER — DAPAGLIFLOZIN PROPANEDIOL 10 MG PO TABS
10.0000 mg | ORAL_TABLET | Freq: Every day | ORAL | Status: DC
  Administered 2022-01-27: 10 mg via ORAL
  Filled 2022-01-27 (×2): qty 1

## 2022-01-27 MED ORDER — POTASSIUM CHLORIDE CRYS ER 20 MEQ PO TBCR
40.0000 meq | EXTENDED_RELEASE_TABLET | Freq: Once | ORAL | Status: AC
  Administered 2022-01-27: 40 meq via ORAL
  Filled 2022-01-27: qty 2

## 2022-01-27 MED ORDER — IPRATROPIUM-ALBUTEROL 0.5-2.5 (3) MG/3ML IN SOLN: 3.0000 mL | RESPIRATORY_TRACT | Status: DC | PRN

## 2022-01-27 MED ORDER — FUROSEMIDE 40 MG PO TABS
40.0000 mg | ORAL_TABLET | Freq: Two times a day (BID) | ORAL | Status: DC
Start: 2022-01-27 — End: 2022-01-30
  Administered 2022-01-27 – 2022-01-30 (×6): 40 mg via ORAL
  Filled 2022-01-27 (×6): qty 1

## 2022-01-27 NOTE — Progress Notes (Signed)
?PROGRESS NOTE ? ? ? ?Jeffrey Skinner  ASN:[id number] DOB: 1956-04-20 DOA: 01/23/2022 ?PCP: Barbette Merino, NP  ?66/male with history of severe obstructive sleep apnea, chronic respiratory failure, supposed to be on BiPAP 22/18 and 3 L home O2, morbid obesity, BMI of 62, chronic diastolic CHF, anxiety, depression, arthritis was brought to the ED after he rolled out of bed, was found down confused. ?-In the ED his O2 sats were 64% on 4 L with encephalopathy, ABG noted severe respiratory acidosis with PCO2> 110, admitted to the ICU, started on BiPAP, IV Lasix ?-Improving, transferred to [facility] service  4/28 ?-Diuresing well, TOC consulted for home BiPAP  ? ?subjective: ?-Feels better overall, compliant with BiPAP ? ?Assessment and Plan: ? ?Acute on chronic hypoxic and hypercarbic respiratory failure ?Acute on chronic diastolic CHF ?Severe OSA ?-He does not have a CPAP/BiPAP at home and was also volume overloaded on admission ?-Improving on BiPAP, diuretics ?-Echo with preserved EF, normal RV size and function noted, likely poor quality due size ?-Diuresed with IV Lasix, he is 6.9 L negative, continue Aldactone ?-Change to p.o. Lasix today ?-Add Farxiga ?-Increase activity as tolerated ?-TOC consulted for home BiPAP ?-Discharge planning ? ?Severe OSA ?-Supposed to be on BiPAP 22/18 with 3 L O2 at baseline ?-See discussion above, TOC consulted for home BiPAP ? ?Morbid obesity ?-BMI 62, poor prognostic factor ? ?Hyperglycemia, borderline diabetes ?HbA1c is 5.7 consistent with borderline diabetes ?-Add [place] ? ?Hypertension ?-Losartan on hold ? ?DVT prophylaxis: Heparin subcutaneous ?Code Status: Full code ?Family Communication: Discussed with patient in detail, no family at bedside ?Disposition Plan: Home likely 1 to 2 days ? ?Consultants:  ? ? ?Procedures:  ? ?Antimicrobials:  ? ? ?Objective: ?Vitals:  ? 01/27/22 0000 01/27/22 0349 01/27/22 0403 01/27/22 0731  ?BP: 111/67 130/75  117/67  ?Pulse: 78 83 76 76  ?Resp: 14 20  (!) 23 19  ?Temp:  97.7 ?F (36.5 ?C)  97.7 ?F (36.5 ?C)  ?TempSrc:  Oral    ?SpO2: 93% 95% 94% 94%  ?Weight:  (!) 234.5 kg    ?Height:      ? ? ?Intake/Output Summary (Last 24 hours) at 01/27/2022 1010 ?Last data filed at 01/26/2022 2300 ?Gross per 24 hour  ?Intake 594 ml  ?Output 2700 ml  ?Net -2106 ml  ? ?Filed Weights  ? 01/25/22 0552 01/26/22 0432 01/27/22 0349  ?Weight: (!) 236.3 kg (!) 235 kg (!) 234.5 kg  ? ? ?Examination: ? ?General exam: Morbidly obese chronically ill male sitting up in bed, AAOx3, no distress ?HEENT: Neck obese unable to assess JVD ?CVS: S1-S2, regular rhythm ?Lungs: Distant breath sounds, otherwise clear ?Abdomen: Soft, obese, nontender, bowel sounds present ?Extremities: Trace edema, chronic venous stasis and thickened skin ?Psychiatry:  Mood & affect appropriate.  ? ? ? ?Data Reviewed:  ? ?CBC: ?Recent Labs  ?Lab 01/23/22 ?0950 01/23/22 ?1006 01/24/22 ?0145 01/24/22 ?1937  ?WBC  --  9.4  --  8.1  ?NEUTROABS  --  7.3  --   --   ?HGB 16.3 13.2 14.6 12.8*  ?HCT 48.0 48.4 43.0 44.6  ?MCV  --  105.9*  --  103.0*  ?PLT  --  176  --  167  ? ?Basic Metabolic Panel: ?Recent Labs  ?Lab 01/24/22 ?0647 01/24/22 ?1712 01/25/22 ?0050 01/26/22 ?9024 01/27/22 ?0359  ?NA 143 142 140 140 140  ?K 4.6 4.2 4.3 3.9 3.8  ?CL 93* 90* 91* 90* 88*  ?CO2 44* >45* >45* 44* 44*  ?  GLUCOSE 99 108* 113* 135* 130*  ?BUN 10 11 10 10 11   ?CREATININE 0.98 1.07 0.91 0.85 0.83  ?CALCIUM 8.0* 7.8* 7.9* 8.2* 8.6*  ?MG  --   --  1.7 1.7 1.8  ? ?GFR: ?Estimated Creatinine Clearance: 187.3 mL/min (by C-G formula based on SCr of 0.83 mg/dL). ?Liver Function Tests: ?Recent Labs  ?Lab 01/23/22 ?1006  ?AST 23  ?ALT 15  ?ALKPHOS 59  ?BILITOT 0.8  ?PROT 7.7  ?ALBUMIN 3.2*  ? ?No results for input(s): LIPASE, AMYLASE in the last 168 hours. ?No results for input(s): AMMONIA in the last 168 hours. ?Coagulation Profile: ?No results for input(s): INR, PROTIME in the last 168 hours. ?Cardiac Enzymes: ?No results for input(s): CKTOTAL, CKMB,  CKMBINDEX, TROPONINI in the last 168 hours. ?BNP (last 3 results) ?No results for input(s): PROBNP in the last 8760 hours. ?HbA1C: ?No results for input(s): HGBA1C in the last 72 hours. ? ?CBG: ?Recent Labs  ?Lab 01/25/22 ?1607 01/25/22 ?1946 01/25/22 ?2351 01/26/22 ?0319 01/26/22 ?01/28/22  ?GLUCAP 120* 147* 139* 142* 136*  ? ?Lipid Profile: ?No results for input(s): CHOL, HDL, LDLCALC, TRIG, CHOLHDL, LDLDIRECT in the last 72 hours. ?Thyroid Function Tests: ?No results for input(s): TSH, T4TOTAL, FREET4, T3FREE, THYROIDAB in the last 72 hours. ?Anemia Panel: ?No results for input(s): VITAMINB12, FOLATE, FERRITIN, TIBC, IRON, RETICCTPCT in the last 72 hours. ?Urine analysis: ?   ?Component Value Date/Time  ? COLORURINE AMBER (A) 01/23/2022 1249  ? APPEARANCEUR HAZY (A) 01/23/2022 1249  ? LABSPEC 1.016 01/23/2022 1249  ? PHURINE 5.0 01/23/2022 1249  ? GLUCOSEU NEGATIVE 01/23/2022 1249  ? HGBUR NEGATIVE 01/23/2022 1249  ? BILIRUBINUR NEGATIVE 01/23/2022 1249  ? KETONESUR 5 (A) 01/23/2022 1249  ? PROTEINUR 30 (A) 01/23/2022 1249  ? NITRITE NEGATIVE 01/23/2022 1249  ? LEUKOCYTESUR NEGATIVE 01/23/2022 1249  ? ?Sepsis Labs: ?@LABRCNTIP (procalcitonin:4,lacticidven:4) ? ?) ?Recent Results (from the past 240 hour(s))  ?MRSA Next Gen by PCR, Nasal     Status: None  ? Collection Time: 01/23/22  9:39 AM  ? Specimen: Nasal Mucosa; Nasal Swab  ?Result Value Ref Range Status  ? MRSA by PCR Next Gen NOT DETECTED NOT DETECTED Final  ?  Comment: (NOTE) ?The GeneXpert MRSA Assay (FDA approved for NASAL specimens only), ?is one component of a comprehensive MRSA colonization surveillance ?program. It is not intended to diagnose MRSA infection nor to guide ?or monitor treatment for MRSA infections. ?Test performance is not FDA approved in patients less than 2 years ?old. ?Performed at [facility], [address], [place], [address] ?[place] ?  ?  ? ?Radiology Studies: ?No results found. ? ? ?Scheduled Meds: ? furosemide  80 mg  Intravenous Q12H  ? heparin  7,500 Units Subcutaneous Q8H  ? ipratropium-albuterol  3 mL Nebulization BID  ? mouth rinse  15 mL Mouth Rinse q12n4p  ? spironolactone  25 mg Oral Daily  ? ?Continuous Infusions: ? sodium chloride 10 mL/hr at 01/24/22 2200  ? ? ? LOS: 4 days  ? ? ?Time spent: 74128 ? ?01/26/22, MD ?[facility] ? ? ?01/27/2022, 10:10 AM  ?  ?

## 2022-01-27 NOTE — Progress Notes (Signed)
?  Mobility Specialist Criteria Algorithm Info. ? ? 01/27/22 1600  ?Mobility  ?Activity Moved into chair position in bed ?(LE exercises)  ? ?Patient declined OOB mobility but agreed to participate in LE exercises. Pt demo  6 x 10 LE exercises. Tolerated well without complaint or incident. Was left with all needs met, call bell in reach. ? ?01/27/2022 ?4:04 PM ? ?Jeffrey Skinner, CMS, BS EXP ?Acute Rehabilitation Services  ?YXAJL:[phone] ?Office: [phone] ? ?

## 2022-01-27 NOTE — Plan of Care (Signed)

## 2022-01-28 ENCOUNTER — Encounter (HOSPITAL_COMMUNITY): Payer: Self-pay | Admitting: Pulmonary Disease

## 2022-01-28 ENCOUNTER — Other Ambulatory Visit (HOSPITAL_COMMUNITY): Payer: Self-pay

## 2022-01-28 DIAGNOSIS — I5031 Acute diastolic (congestive) heart failure: Secondary | ICD-10-CM | POA: Diagnosis not present

## 2022-01-28 DIAGNOSIS — I509 Heart failure, unspecified: Secondary | ICD-10-CM | POA: Diagnosis not present

## 2022-01-28 LAB — BASIC METABOLIC PANEL
Anion gap: 6 (ref 5–15)
BUN: 14 mg/dL (ref 8–23)
CO2: 42 mmol/L — ABNORMAL HIGH (ref 22–32)
Calcium: 8.4 mg/dL — ABNORMAL LOW (ref 8.9–10.3)
Chloride: 88 mmol/L — ABNORMAL LOW (ref 98–111)
Creatinine, Ser: 0.95 mg/dL (ref 0.61–1.24)
GFR, Estimated: >60 mL/min (ref >60)
Glucose, Bld: 125 mg/dL — ABNORMAL HIGH (ref 70–99)
Potassium: 4.1 mmol/L (ref 3.5–5.1)
Sodium: 136 mmol/L (ref 135–145)

## 2022-01-28 LAB — CBC
HCT: 40.3 % (ref 39.0–52.0)
Hemoglobin: 11.7 g/dL — ABNORMAL LOW (ref 13.0–17.0)
MCH: 28.6 pg (ref 26.0–34.0)
MCHC: 29 g/dL — ABNORMAL LOW (ref 30.0–36.0)
MCV: 98.5 fL (ref 80.0–100.0)
Platelets: 167 10*3/uL (ref 150–400)
RBC: 4.09 MIL/uL — ABNORMAL LOW (ref 4.22–5.81)
RDW: 15.1 % (ref 11.5–15.5)
WBC: 8.2 10*3/uL (ref 4.0–10.5)
nRBC: 0 % (ref 0.0–0.2)

## 2022-01-28 MED ORDER — EMPAGLIFLOZIN 10 MG PO TABS
10.0000 mg | ORAL_TABLET | Freq: Every day | ORAL | Status: DC
  Administered 2022-01-28 – 2022-01-30 (×3): 10 mg via ORAL
  Filled 2022-01-28 (×3): qty 1

## 2022-01-28 NOTE — TOC CM/SW Note (Signed)
?  Jeffrey Skinner is a 66 y/o male with Chronic respiratory hypercarbia Failure and COPD . Patient presented to ED with complaints of sob and respiratory failure. Upon admission (01/23/2022), patient was placed on Bipap ST/Avaps  with a settings of IPAP 22, EPAP 18 And backup rate of 8 and failed due to hypercapnia with a PCO2=123. Due to patient's condition, he is at risk of worsening of Respiratory failure and COPD. Patient has had numerous readmissions in the last 2 months with issues involving respiratory failure. Bipap ST/Avaps has been tried and failed however; patient will require volume ventilation to promote gas exchange via NIV therapy. Patient also requires mouthpiece ventilation for daytime as the pco2 elevates during the day. Due to the severity of the patient and the need for daytime mouthpiece ventilation, back up battery, alarms and portability an NIV is required which is not supported by the bipap/rad/bipap avaps device.? Patient is able to keep airway clear and clear secretions.  ? ?

## 2022-01-28 NOTE — Progress Notes (Signed)
Heart Failure Nurse Navigator Progress Note ? ?PCP: Barbette Merino, NP ?PCP-Cardiologist: None ?Admission Diagnosis: Acute respiratory failure with hypercapnia and acute congestive heart failure.  ?Admitted from: Home via EMS ? ?Presentation:   ?Jeffrey Skinner presented with respiratory distress, altered mental status x 3 days, hypoxia. Per family patient had rolled out of bed and was laying face down on the floor when found. EMS arrived oxygen saturations  were 64% on 4 L home oxygen. Edema to bilateral extremities, last EF 60-65%, IV lasix given, BP 156/134,patient admitted to ICU.  ? ?Patient interviewed for HF TOC, stated he doesn't smoke , Vape, or chew tobacco, he may have 1 shot per 1-2 weeks, he takes all his medications as prescribed. Doesn't weigh himself because he can't use a scale and see the numbers. (Does have a scale at home)  Patient was educated on the importance of weighing himself daily, when to call the doctor or go to the ER with heart failure sign and symptoms, Diet/ fluid restrictions, as well as eating out options, taking all his medications as prescribed and going to all his medical doctor appointments, he spoke about issues with getting a BiPap from the [place] and the problems he has had with getting into contact with them . CSM Isidoro Donning, is currently working on getting him one to  go home with. Patient doesn't drive much anymore , however will have a ride to his 02/06/2022 @ 10 am appointment at HF TOC.  ? ?ECHO/ LVEF: 60-65% ? ?Clinical Course: ? ?Past Medical History:  ?Diagnosis Date  ? Acute diastolic CHF (congestive heart failure) (HCC) 06/14/2019  ? Anxiety 12/16/2019  ? Anxiety 11/2019  ? Arthritis   ? COPD (chronic obstructive pulmonary disease) (HCC) 12/30/2020  ? Depression   ? Dyspnea   ? Hypertension   ? Insomnia 11/2029  ? Morbid obesity with BMI of 60.0-69.9, adult (HCC)   ? Sleep apnea   ? Vitamin D deficiency 06/2019  ?  ? ?Social History  ? ?Socioeconomic History  ? Marital  status: Married  ?  Spouse name: Erie Noe  ? Number of children: 2  ? Years of education: Not on file  ? Highest education level: Not on file  ?Occupational History  ? Not on file  ?Tobacco Use  ? Smoking status: Never  ? Smokeless tobacco: Never  ?Vaping Use  ? Vaping Use: Never used  ?Substance and Sexual Activity  ? Alcohol use: Yes  ?  Alcohol/week: 1.0 standard drink  ?  Types: 1 Shots of liquor per week  ?  Comment: - once a month  ? Drug use: Yes  ?  Types: Marijuana  ?  Comment: High School  ? Sexual activity: Not Currently  ?Other Topics Concern  ? Not on file  ?Social History Narrative  ? Not on file  ? ?Social Determinants of Health  ? ?Financial Resource Strain: Not on file  ?Food Insecurity: Not on file  ?Transportation Needs: Not on file  ?Physical Activity: Not on file  ?Stress: Not on file  ?Social Connections: Not on file  ? ? ?High Risk Criteria for Readmission and/or Poor Patient Outcomes: ?Heart failure hospital admissions (last 6 months): 1  ?No Show rate: 13% ?Difficult social situation: No ?Demonstrates medication adherence: Yes ?Primary Language: English ?Literacy level: reading, writing, and comprehension.  ? ?Barriers of Care:   ?Diet/ Fluid restrictions, elevated BMI ,  ?CSM : working on getting patient Bi-pap for home thru the [place].  ?  Unable to do daily weights per patient.  ? ?Considerations/Referrals:  ? ?Referral made to Heart Failure Pharmacist Stewardship:  ?Referral made to Heart Failure CSW/NCM TOC: yes, home BiPap ?Referral made to Heart & Vascular TOC clinic: Yes, 02/06/2022 @ 10 am.  ? ?Items for Follow-up on DC/TOC: ?Optimize medications ?Diet/ fluid restrictions ?CSW: Make sure received his Bipap from the [place].  ? ? ?Rhae Hammock, BSN, RN ?Heart Failure Nurse Navigator ?Secure Chat Only   ?

## 2022-01-28 NOTE — TOC Progression Note (Addendum)
Transition of Care (TOC) - Progression Note  ? ? ?Patient Details  ?Name: Jeffrey Skinner ?MRN: [id number] ?Date of Birth: [birth year] ? ?Transition of Care (TOC) CM/SW Contact  ?Beckie Busing, RN ?Phone Number:[phone] ? ?01/28/2022, 4:02 PM ? ?Clinical Narrative:    ?CM at bedside to offer patient choice for home health . Patient states that he does not have a preference for home health services. Home health referral called to [facility].Acceptance pending ? ?[facility] Home Health referral has been accepted by [facility] with [facility]. AVS updated ? ? ?  ?Barriers to Discharge: Continued Medical Work up ? ?Expected Discharge Plan and Services ?  ?  ?Discharge Planning Services: CM Consult ?  ?Living arrangements for the past 2 months: Single Family Home ?                ?  ?  ?  ?  ?  ?  ?  ?  ?  ?  ? ? ?Social Determinants of Health (SDOH) Interventions ?Food Insecurity Interventions: Intervention Not Indicated ?Financial Strain Interventions: Intervention Not Indicated ?Housing Interventions: Intervention Not Indicated ?Transportation Interventions: Intervention Not Indicated ? ?Readmission Risk Interventions ?   ? View : No data to display.  ?  ?  ?  ? ? ?

## 2022-01-28 NOTE — Progress Notes (Signed)
Pt placed on BiPAP for bed. RT will cont to monitor as needed. RT will cont to monitor.  ?

## 2022-01-28 NOTE — Progress Notes (Signed)
?PROGRESS NOTE ? ? ? ?Jeffrey Skinner  JOI:[id number] DOB: [birth year] DOA: 01/23/2022 ?PCP: Barbette Merino, NP  ?66/male with history of severe obstructive sleep apnea, chronic respiratory failure, supposed to be on BiPAP 22/18 and 3 L home O2, morbid obesity, BMI of 62, chronic diastolic CHF, anxiety, depression, arthritis was brought to the ED after he rolled out of bed, was found down confused. ?-In the ED his O2 sats were 64% on 4 L with encephalopathy, ABG noted severe respiratory acidosis with PCO2> 110, admitted to the ICU, started on BiPAP, IV Lasix ?-Improving, transferred to [facility] service  4/28 ?-Diuresing well, TOC consulted for home BiPAP  ? ?subjective: ?-Feels okay, no events overnight, compliant with BiPAP ? ?Assessment and Plan: ? ?Acute on chronic hypoxic and hypercarbic respiratory failure ?Acute on chronic diastolic CHF ?Severe OSA ?-He does not have a CPAP/BiPAP at home and was also volume overloaded on admission ?-Improving on BiPAP, diuretics ?-Echo with preserved EF, normal RV size and function noted, likely poor quality due size ?-Diuresed with IV Lasix, he is 6.9 L negative, continue Aldactone ?-Changed to oral diuretics yesterday, started on SGLT2i, kidney function is stable ?-Discharge planning, TOC following, Home when BiPAP set up ? ?Severe OSA ?-Supposed to be on BiPAP 22/18 with 3 L O2 at baseline ?-See discussion above, TOC consulted for home BiPAP ? ?Morbid obesity ?-BMI 62, poor prognostic factor ? ?Hyperglycemia, borderline diabetes ?HbA1c is 5.7 consistent with borderline diabetes ?-Started on SGLT2i ? ?Hypertension ?-Losartan on hold ? ?DVT prophylaxis: Heparin subcutaneous ?Code Status: Full code ?Family Communication: Discussed with patient in detail, no family at bedside ?Disposition Plan: Home later today or in a.m. ? ?Consultants:  ? ? ?Procedures:  ? ?Antimicrobials:  ? ? ?Objective: ?Vitals:  ? 01/27/22 2132 01/27/22 2304 01/27/22 2346 01/28/22 0456  ?BP: 120/62  120/74 112/64   ?Pulse: 79 77 75 71  ?Resp: 18 14 17 17   ?Temp: 98.2 ?F (36.8 ?C)  98 ?F (36.7 ?C) 98.6 ?F (37 ?C)  ?TempSrc: Oral  Oral Oral  ?SpO2: 95% 96% 95% 97%  ?Weight:    (!) 226.3 kg  ?Height:      ? ? ?Intake/Output Summary (Last 24 hours) at 01/28/2022 1117 ?Last data filed at 01/27/2022 1500 ?Gross per 24 hour  ?Intake 240 ml  ?Output 1200 ml  ?Net -960 ml  ? ?Filed Weights  ? 01/26/22 0432 01/27/22 0349 01/28/22 0456  ?Weight: (!) 235 kg (!) 234.5 kg (!) 226.3 kg  ? ? ?Examination: ? ?General exam: Morbidly obese chronically ill male sitting up in bed, AAOx3, no distress ?HEENT: Neck obese unable to assess JVD ?CVS: S1-S2, regular rhythm ?Lungs: Distant breath sounds, otherwise clear ?Abdomen: Soft, obese, nontender, bowel sounds present  ?Extremities: Trace edema, chronic venous stasis and thickened skin ?Psychiatry:  Mood & affect appropriate.  ? ? ? ?Data Reviewed:  ? ?CBC: ?Recent Labs  ?Lab 01/23/22 ?0950 01/23/22 ?1006 01/24/22 ?0145 01/24/22 ?01/26/22 01/28/22 ?03/30/22  ?WBC  --  9.4  --  8.1 8.2  ?NEUTROABS  --  7.3  --   --   --   ?HGB 16.3 13.2 14.6 12.8* 11.7*  ?HCT 48.0 48.4 43.0 44.6 40.3  ?MCV  --  105.9*  --  103.0* 98.5  ?PLT  --  176  --  167 167  ? ?Basic Metabolic Panel: ?Recent Labs  ?Lab 01/24/22 ?1712 01/25/22 ?0050 01/26/22 ?01/28/22 01/27/22 ?0359 01/28/22 ?03/30/22  ?NA 142 140 140 140 136  ?K 4.2  4.3 3.9 3.8 4.1  ?CL 90* 91* 90* 88* 88*  ?CO2 >45* >45* 44* 44* 42*  ?GLUCOSE 108* 113* 135* 130* 125*  ?BUN 11 10 10 11 14   ?CREATININE 1.07 0.91 0.85 0.83 0.95  ?CALCIUM 7.8* 7.9* 8.2* 8.6* 8.4*  ?MG  --  1.7 1.7 1.8  --   ? ?GFR: ?Estimated Creatinine Clearance: 160 mL/min (by C-G formula based on SCr of 0.95 mg/dL). ?Liver Function Tests: ?Recent Labs  ?Lab 01/23/22 ?1006  ?AST 23  ?ALT 15  ?ALKPHOS 59  ?BILITOT 0.8  ?PROT 7.7  ?ALBUMIN 3.2*  ? ?No results for input(s): LIPASE, AMYLASE in the last 168 hours. ?No results for input(s): AMMONIA in the last 168 hours. ?Coagulation Profile: ?No results for input(s):  INR, PROTIME in the last 168 hours. ?Cardiac Enzymes: ?No results for input(s): CKTOTAL, CKMB, CKMBINDEX, TROPONINI in the last 168 hours. ?BNP (last 3 results) ?No results for input(s): PROBNP in the last 8760 hours. ?HbA1C: ?No results for input(s): HGBA1C in the last 72 hours. ? ?CBG: ?Recent Labs  ?Lab 01/25/22 ?1607 01/25/22 ?1946 01/25/22 ?2351 01/26/22 ?0319 01/26/22 ?01/28/22  ?GLUCAP 120* 147* 139* 142* 136*  ? ?Lipid Profile: ?No results for input(s): CHOL, HDL, LDLCALC, TRIG, CHOLHDL, LDLDIRECT in the last 72 hours. ?Thyroid Function Tests: ?No results for input(s): TSH, T4TOTAL, FREET4, T3FREE, THYROIDAB in the last 72 hours. ?Anemia Panel: ?No results for input(s): VITAMINB12, FOLATE, FERRITIN, TIBC, IRON, RETICCTPCT in the last 72 hours. ?Urine analysis: ?   ?Component Value Date/Time  ? COLORURINE AMBER (A) 01/23/2022 1249  ? APPEARANCEUR HAZY (A) 01/23/2022 1249  ? LABSPEC 1.016 01/23/2022 1249  ? PHURINE 5.0 01/23/2022 1249  ? GLUCOSEU NEGATIVE 01/23/2022 1249  ? HGBUR NEGATIVE 01/23/2022 1249  ? BILIRUBINUR NEGATIVE 01/23/2022 1249  ? KETONESUR 5 (A) 01/23/2022 1249  ? PROTEINUR 30 (A) 01/23/2022 1249  ? NITRITE NEGATIVE 01/23/2022 1249  ? LEUKOCYTESUR NEGATIVE 01/23/2022 1249  ? ?Sepsis Labs: ?@LABRCNTIP (procalcitonin:4,lacticidven:4) ? ?) ?Recent Results (from the past 240 hour(s))  ?MRSA Next Gen by PCR, Nasal     Status: None  ? Collection Time: 01/23/22  9:39 AM  ? Specimen: Nasal Mucosa; Nasal Swab  ?Result Value Ref Range Status  ? MRSA by PCR Next Gen NOT DETECTED NOT DETECTED Final  ?  Comment: (NOTE) ?The GeneXpert MRSA Assay (FDA approved for NASAL specimens only), ?is one component of a comprehensive MRSA colonization surveillance ?program. It is not intended to diagnose MRSA infection nor to guide ?or monitor treatment for MRSA infections. ?Test performance is not FDA approved in patients less than 2 years ?old. ?Performed at [facility], [address], [place],  [address] ?[place] ?  ?  ? ?Radiology Studies: ?No results found. ? ? ?Scheduled Meds: ? empagliflozin  10 mg Oral Daily  ? furosemide  40 mg Oral BID  ? heparin  7,500 Units Subcutaneous Q8H  ? mouth rinse  15 mL Mouth Rinse q12n4p  ? spironolactone  25 mg Oral Daily  ? ?Continuous Infusions: ? sodium chloride 10 mL/hr at 01/24/22 2200  ? ? ? LOS: 5 days  ? ? ?Time spent: 06301 ? ?01/26/22, MD ?[facility] ? ? ?01/28/2022, 11:17 AM  ?  ?

## 2022-01-28 NOTE — TOC CM/SW Note (Addendum)
HF TOC CM contacted [facility], left message for [place] CSW, to follow up on Bipap. Pt states he had sleep study in Feb 2023 but has not heard back about delivery of Bipap. Pt contacted [facility] T[facility] and spoke to [place] triage rep, provided rep with contact info to give call back to HF CM to follow up on Bipap. States he will send a message back to [facility], Dr Charleston Poot. Will have PCP's RN give me a call on Bipap status. Isidoro Donning RN3 CCM, Heart Failure TOC CM [phone]  ? ?Contacted [employer] rep, Jermaine for possible Trilogy/Bipap with [employer], states pt will have a copay if he uses his [facility] commercial plan of $30-100. Updated pt on copay price. Isidoro Donning RN3 CCM, Heart Failure TOC CM [phone]  ? ?01/28/2022 1158 am Received call back from [facility] CSW, [place] and provided information on Trilogy/Bipap. States she will get in contact with pt's PCP in clinic. Will give CM call back. Updated pt and pt agreeable to proceed with [employer] for DME. Updated attending. And Orders signed for Trilogy/Bipap. [employer] will submit to insurance for approval. Isidoro Donning RN3 CCM, Heart Failure TOC CM [phone]  ?

## 2022-01-28 NOTE — Progress Notes (Signed)
Occupational Therapy Treatment ?Patient Details ?Name: Jeffrey Skinner ?MRN: [id number] ?DOB: 1956-08-22 ?Today's Date: 01/28/2022 ? ? ?History of present illness Pt is a 66 y/o male admitted secondary to fall out of bed and unresponsiveness. Found to have respiratory failure. PMH includes CHF, OSA, Obesity, repiratory failure on 3L O2. ?  ?OT comments ? Patient completing PT treatment upon entry and agreeable to OT session. Patient educated on use of LH sponge for LB bathing and bathing back.  Patient instructed to doffing socks with reacher and min assist and mod assist to donn socks with sock aide and was limited due to pain. Patient asking about toilet hygiene aides and was provided with information and education on products available. Patient to continue to be followed by acute OT.   ? ?Recommendations for follow up therapy are one component of a multi-disciplinary discharge planning process, led by the attending physician.  Recommendations may be updated based on patient status, additional functional criteria and insurance authorization. ?   ?Follow Up Recommendations ? Home health OT  ?  ?Assistance Recommended at Discharge Intermittent Supervision/Assistance  ?Patient can return home with the following ? A little help with walking and/or transfers;A little help with bathing/dressing/bathroom;Assistance with cooking/housework ?  ?Equipment Recommendations ? Tub/shower bench;BSC/3in1;Other (comment)  ?  ?Recommendations for Other Services   ? ?  ?Precautions / Restrictions Precautions ?Precautions: Fall ?Precaution Comments: watch O2 ?Restrictions ?Weight Bearing Restrictions: No  ? ? ?  ? ?Mobility Bed Mobility ?Overal bed mobility: Needs Assistance ?  ?  ?  ?  ?  ?  ?General bed mobility comments: seated in recliner ?  ? ?Transfers ?Overall transfer level: Needs assistance ?  ?  ?  ?  ?  ?  ?  ?  ?General transfer comment: PT assisting patient to recliner upon entry ?  ?  ?Balance Overall balance assessment: Needs  assistance ?Sitting-balance support: No upper extremity supported ?Sitting balance-Leahy Scale: Fair ?Sitting balance - Comments: seated in recliner ?  ?  ?  ?  ?  ?  ?  ?  ?  ?  ?  ?  ?  ?  ?  ?   ? ?ADL either performed or assessed with clinical judgement  ? ?ADL Overall ADL's : Needs assistance/impaired ?  ?  ?  ?  ?  ?  ?Lower Body Bathing: Moderate assistance;Sitting/lateral leans ?Lower Body Bathing Details (indicate cue type and reason): to bathe feet. education on LH sponge use for bathing back and feet ?  ?  ?Lower Body Dressing: Moderate assistance ?Lower Body Dressing Details (indicate cue type and reason): education on reacher use for doffing socks and sock aide for donning socks with difficulty using sock aide due to pain ?  ?  ?  ?  ?  ?  ?  ?General ADL Comments: AE training with reacher, sock aide and LH sponge.  Education on toilet wand and other devices to use to address toilet hygiene ?  ? ?Extremity/Trunk Assessment   ?  ?  ?  ?  ?  ? ?Vision   ?  ?  ?Perception   ?  ?Praxis   ?  ? ?Cognition Arousal/Alertness: Awake/alert ?Behavior During Therapy: [facility] for tasks assessed/performed ?Overall Cognitive Status: Impaired/Different from baseline ?Area of Impairment: Memory ?  ?  ?  ?  ?  ?  ?  ?  ?  ?  ?Memory: Decreased short-term memory ?  ?  ?  ?  ?  ?  ?  ?   ?  Exercises   ? ?  ?Shoulder Instructions   ? ? ?  ?General Comments VSS on 3L O2  ? ? ?Pertinent Vitals/ Pain       Pain Assessment ?Pain Assessment: Faces ?Faces Pain Scale: Hurts little more ?Pain Location: scrotum ?Pain Descriptors / Indicators: Discomfort, Grimacing ?Pain Intervention(s): Monitored during session ? ?Home Living   ?  ?  ?  ?  ?  ?  ?  ?  ?  ?  ?  ?  ?  ?  ?  ?  ?  ?  ? ?  ?Prior Functioning/Environment    ?  ?  ?  ?   ? ?Frequency ? Min 2X/week  ? ? ? ? ?  ?Progress Toward Goals ? ?OT Goals(current goals can now be found in the care plan section) ? Progress towards OT goals: Progressing toward goals ? ?Acute Rehab OT  Goals ?Patient Stated Goal: get better ?OT Goal Formulation: With patient ?Time For Goal Achievement: 02/09/22 ?Potential to Achieve Goals: Good ?ADL Goals ?Pt Will Perform Lower Body Dressing: with set-up;sit to/from stand;with adaptive equipment ?Pt Will Transfer to Toilet: with supervision;ambulating ?Additional ADL Goal #1: Pt will use AE to independently wash his back and BLE. ?Additional ADL Goal #2: Pt will demonstrate independence with 3 fall prevention strategies for safe engagement in ADL/IADL and functional mobiltiy.  ?Plan Discharge plan remains appropriate   ? ?Co-evaluation ? ? ?   ?  ?  ?  ?  ? ?  ?AM-PAC OT "6 Clicks" Daily Activity     ?Outcome Measure ? ? Help from another person eating meals?: None ?Help from another person taking care of personal grooming?: A Little ?Help from another person toileting, which includes using toliet, bedpan, or urinal?: A Little ?Help from another person bathing (including washing, rinsing, drying)?: A Little ?Help from another person to put on and taking off regular upper body clothing?: A Little ?Help from another person to put on and taking off regular lower body clothing?: A Lot ?6 Click Score: 18 ? ?  ?End of Session Equipment Utilized During Treatment: Oxygen ? ?OT Visit Diagnosis: Unsteadiness on feet (R26.81);Other abnormalities of gait and mobility (R26.89);Muscle weakness (generalized) (M62.81) ?  ?Activity Tolerance Patient tolerated treatment well ?  ?Patient Left in chair;with call bell/phone within reach ?  ?Nurse Communication Mobility status ?  ? ?   ? ?Time: 4401-0272 ?OT Time Calculation (min): 27 min ? ?Charges: OT General Charges ?$OT Visit: 1 Visit ?OT Treatments ?$Self Care/Home Management : 23-37 mins ? ?Jeffrey Skinner, OTA ?Acute Rehabilitation Services  ?Pager [phone] ?Office [phone] ? ? ?Jeffrey Skinner ?01/28/2022, 1:06 PM ?

## 2022-01-28 NOTE — Progress Notes (Signed)
Physical Therapy Treatment ?Patient Details ?Name: Jeffrey Skinner ?MRN: [id number] ?DOB: [birth year] ?Today's Date: 01/28/2022 ? ? ?History of Present Illness Pt is a 66 y/o male admitted secondary to fall out of bed and unresponsiveness. Found to have respiratory failure. PMH includes CHF, OSA, Obesity, repiratory failure on 3L O2. ? ?  ?PT Comments  ? ? Pt is progressing towards his physical therapy goals; agreeable to participate. NT present to assist with peri care and pad change. Pt ambulating 15 ft x 2 with a Rollator and chair follow. Demonstrates impaired balance, weakness, and decreased endurance. D/c plan remains appropriate.  ?   ?Recommendations for follow up therapy are one component of a multi-disciplinary discharge planning process, led by the attending physician.  Recommendations may be updated based on patient status, additional functional criteria and insurance authorization. ? ?Follow Up Recommendations ? Home health PT ?  ?  ?Assistance Recommended at Discharge Frequent or constant Supervision/Assistance  ?Patient can return home with the following A little help with walking and/or transfers;A little help with bathing/dressing/bathroom;Assistance with cooking/housework;Help with stairs or ramp for entrance;Assist for transportation ?  ?Equipment Recommendations ? Other (comment);BSC/3in1;Rolling walker (2 wheels) (bariatric equipment)  ?  ?Recommendations for Other Services   ? ? ?  ?Precautions / Restrictions Precautions ?Precautions: Fall ?Precaution Comments: watch O2 ?Restrictions ?Weight Bearing Restrictions: No  ?  ? ?Mobility ? Bed Mobility ?Overal bed mobility: Needs Assistance ?Bed Mobility: Rolling, Supine to Sit ?Rolling: Min assist ?  ?Supine to sit: Min assist ?  ?  ?General bed mobility comments: Rolling to R/L for peri care with minA, cues for use of bed rail to pull into sidelying position. Exiting towards right side of bed, assist for truncal elevation ?  ? ?Transfers ?Overall transfer  level: Needs assistance ?Equipment used: Rollator (4 wheels) ?Transfers: Sit to/from Stand ?Sit to Stand: Min assist ?  ?  ?  ?  ?  ?General transfer comment: MinA to boost to standing position ?  ? ?Ambulation/Gait ?Ambulation/Gait assistance: Min assist, +2 safety/equipment ?Gait Distance (Feet): 30 Feet (15 ft x 2) ?Assistive device: Rollator (4 wheels) ?Gait Pattern/deviations: Step-through pattern, Decreased stride length, Wide base of support ?Gait velocity: decreased ?  ?  ?General Gait Details: MinA for steadying assist, pt requiring one seated rest break in between bouts ? ? ?Stairs ?  ?  ?  ?  ?  ? ? ?Wheelchair Mobility ?  ? ?Modified Rankin (Stroke Patients Only) ?  ? ? ?  ?Balance Overall balance assessment: Needs assistance ?Sitting-balance support: No upper extremity supported ?Sitting balance-Leahy Scale: Fair ?  ?  ?Standing balance support: Bilateral upper extremity supported ?Standing balance-Leahy Scale: Poor ?Standing balance comment: Reliant on UE and external support ?  ?  ?  ?  ?  ?  ?  ?  ?  ?  ?  ?  ? ?  ?Cognition Arousal/Alertness: Awake/alert ?Behavior During Therapy: [facility] for tasks assessed/performed ?Overall Cognitive Status: Impaired/Different from baseline ?Area of Impairment: Memory ?  ?  ?  ?  ?  ?  ?  ?  ?  ?  ?Memory: Decreased short-term memory ?  ?  ?  ?  ?  ?  ?  ? ?  ?Exercises   ? ?  ?General Comments General comments (skin integrity, edema, etc.): VSS on 3L O2 ?  ?  ? ?Pertinent Vitals/Pain Pain Assessment ?Pain Assessment: Faces ?Faces Pain Scale: Hurts little more ?Pain Location: scrotum ?Pain Descriptors / Indicators:  Discomfort, Grimacing ?Pain Intervention(s): Monitored during session  ? ? ?Home Living   ?  ?  ?  ?  ?  ?  ?  ?  ?  ?   ?  ?Prior Function    ?  ?  ?   ? ?PT Goals (current goals can now be found in the care plan section) Acute Rehab PT Goals ?Potential to Achieve Goals: Fair ?Progress towards PT goals: Progressing toward goals ? ?  ?Frequency ? ? ? Min  3X/week ? ? ? ?  ?PT Plan Current plan remains appropriate  ? ? ?Co-evaluation   ?  ?  ?  ?  ? ?  ?AM-PAC PT "6 Clicks" Mobility   ?Outcome Measure ? Help needed turning from your back to your side while in a flat bed without using bedrails?: A Little ?Help needed moving from lying on your back to sitting on the side of a flat bed without using bedrails?: A Little ?Help needed moving to and from a bed to a chair (including a wheelchair)?: A Little ?Help needed standing up from a chair using your arms (e.g., wheelchair or bedside chair)?: A Little ?Help needed to walk in hospital room?: A Little ?Help needed climbing 3-5 steps with a railing? : Total ?6 Click Score: 16 ? ?  ?End of Session Equipment Utilized During Treatment: Oxygen ?Activity Tolerance: Patient tolerated treatment well ?Patient left: in chair;with call bell/phone within reach ?Nurse Communication: Mobility status ?PT Visit Diagnosis: Unsteadiness on feet (R26.81);Muscle weakness (generalized) (M62.81) ?  ? ? ?Time: 2841-3244 ?PT Time Calculation (min) (ACUTE ONLY): 46 min ? ?Charges:  $Therapeutic Activity: 38-52 mins          ?          ? ?Jeffrey Skinner, PT, DPT ?Acute Rehabilitation Services ?Pager [phone] ?Office [phone] ? ? ? ?Jeffrey Skinner ?01/28/2022, 1:03 PM ? ?

## 2022-01-28 NOTE — TOC Benefit Eligibility Note (Signed)
Patient Advocate Encounter ?  ?Insurance verification completed.   ?  ?The patient is currently admitted and upon discharge could be taking JARDIANCE 10 MG. ?  ?The current 30 day co-pay is, $45.  ? ?The patient is insured through [employer]. ? ? ?   ?

## 2022-01-29 ENCOUNTER — Other Ambulatory Visit (HOSPITAL_COMMUNITY): Payer: Self-pay

## 2022-01-29 MED ORDER — POLYETHYLENE GLYCOL 3350 17 GM/SCOOP PO POWD
17.0000 g | Freq: Every day | ORAL | 0 refills | Status: DC | PRN
  Filled 2022-01-29: qty 238, 14d supply, fill #0

## 2022-01-29 MED ORDER — EMPAGLIFLOZIN 10 MG PO TABS
10.0000 mg | ORAL_TABLET | Freq: Every day | ORAL | 0 refills | Status: DC
Start: 2022-01-29 — End: 2022-02-06
  Filled 2022-01-29: qty 30, 30d supply, fill #0

## 2022-01-29 MED ORDER — SPIRONOLACTONE 25 MG PO TABS
25.0000 mg | ORAL_TABLET | Freq: Every day | ORAL | 0 refills | Status: DC
  Filled 2022-01-29: qty 30, 30d supply, fill #0

## 2022-01-29 MED ORDER — FUROSEMIDE 40 MG PO TABS
40.0000 mg | ORAL_TABLET | Freq: Two times a day (BID) | ORAL | 0 refills | Status: DC
  Filled 2022-01-29: qty 60, 30d supply, fill #0

## 2022-01-29 NOTE — Progress Notes (Signed)
?  Mobility Specialist Criteria Algorithm Info. ? ? 01/29/22 1117  ?Mobility  ?Activity Ambulated with assistance in hallway  ?Range of Motion/Exercises Active;All extremities  ?Level of Assistance Standby assist, set-up cues, supervision of patient - no hands on ?(Min A +2 bed mobility)  ?Assistive Device None  ?Distance Ambulated (ft) 10 ft  ?Activity Response Tolerated well  ? ?Patient received in supine agreeable to participate. Was soiled and unaware of stool incontinence, didn't know how long he was soiled. Required min A +2 for bed mobility but max A for pericare. After being cleaned up pt got to EOB with minimal HHA. Stood and ambulated short distance in room to recliner with supervision. Tolerated well without complaint or incident. Was left with all needs met, call bell in reach. ? ?01/29/2022 ?2:25 PM ? ?[place] Maston W[place], CMS, BS EXP ?Acute Rehabilitation Services  ?AESLP:[phone] ?Office: [phone] ? ?

## 2022-01-29 NOTE — TOC CM/SW Note (Signed)
HF TOC CM contacted [facility] CSW, Alger Simons and left message to follow up Bipap/Trilogy. Left message for return call. Isidoro Donning RN3 CCM, Heart Failure TOC CM [phone]  ?

## 2022-01-29 NOTE — TOC Progression Note (Addendum)
Transition of Care (TOC) - Progression Note  ? ? ?Patient Details  ?Name: Jeffrey Skinner ?MRN: [id number] ?Date of Birth: 1956/03/07 ? ?Transition of Care (TOC) CM/SW Contact  ?Beckie Busing, RN ?Phone Number:[phone] ? ?01/29/2022, 10:11 AM ? ?Clinical Narrative:    ?Home health has been set up with [facility]. AVS updated. DME rolling walker, & 3in1 have been ordered through [facility] with [employer], Awaiting confirmation on trilogy.  ? ?1100 CM spoke with [facility] hospital liaison for [facility] care. Per Vaughan Basta patient has been approved for trilogy but rep will need to speak with patient and spouse to see if they are agreeable to co pay. CM awaiting return call ? ?1213 CM received call from [facility] with [facility] care. Patient has been approved for trilogy. Vaughan Basta has spoken with patient and wife. Wife is agreeable to co pay. Respiratory therapist from [facility] will be here at bedside today between 3:30- 4:30pm for setup and teaching for trilogy. Patient refused bariatric walker delivery but accepted bariatric 3in1 delivery. MD has been updated and will enter discharge orders.  ? ?1250 Bedside nurse Joni Reining has been made aware that RT will be here today betwem 3:30 pm-4:30 pm for teaching and set up of trilogy. PLEASE DO NOT DISCHARGE BEFORE TRILOGY ARRIVES AND TEACHING IS DONE.  ? ? ? ?  ?Barriers to Discharge: Continued Medical Work up ? ?Expected Discharge Plan and Services ?  ?  ?Discharge Planning Services: CM Consult ?  ?Living arrangements for the past 2 months: Single Family Home ?                ?  ?  ?  ?  ?  ?  ?  ?  ?  ?  ? ? ?Social Determinants of Health (SDOH) Interventions ?Food Insecurity Interventions: Intervention Not Indicated ?Financial Strain Interventions: Intervention Not Indicated ?Housing Interventions: Intervention Not Indicated ?Transportation Interventions: Intervention Not Indicated ? ?Readmission Risk Interventions ?   ? View : No data to display.  ?  ?  ?  ? ? ?

## 2022-01-29 NOTE — Discharge Summary (Signed)
Physician Discharge Summary  ?Jeffrey Skinner [id number] DOB: August 21, 1956 DOA: 01/23/2022 ? ?PCP: Vevelyn Francois, NP ? ?Admit date: 01/23/2022 ?Discharge date: 01/29/2022 ? ?Time spent: 35 minutes ? ?Recommendations for Outpatient Follow-up:  ?PCP at the [facility] to follow-up in 1 to 2 weeks, ensure compliance with BiPAP and diuretics ?Needs weight loss referral ?Please check BMP in 1 week ?CHF TOC clinic 5/10 ? ? ?Discharge Diagnoses:  ?Active Problems: ?  Acute on chronic respiratory failure with hypoxia and hypercapnia (HCC) ?  Acute diastolic CHF (congestive heart failure) ([place]) ?  Class 3 severe obesity due to excess calories with serious comorbidity and body mass index (BMI) of 60.0 to 69.9 in adult [facility]) ?  Obstructive sleep apnea ?Morbid obesity, BMI of 62 ?Borderline diabetes mellitus ? ?Discharge Condition: Stable ? ?Diet recommendation: Low-sodium, diabetic ? ?Filed Weights  ? 01/27/22 0349 01/28/22 0456 01/29/22 0440  ?Weight: (!) 234.5 kg (!) 226.3 kg (!) 229.1 kg  ? ? ?History of present illness:  ?66/male with history of severe obstructive sleep apnea, chronic respiratory failure, supposed to be on BiPAP 22/18 and 3 L home O2, morbid obesity, BMI of 62, chronic diastolic CHF, anxiety, depression, arthritis was brought to the ED after he rolled out of bed, was found down confused. ?-In the ED his O2 sats were 64% on 4 L with encephalopathy, ABG noted severe respiratory acidosis with PCO2> 110, admitted to the ICU, started on BiPAP, IV Lasix ? ?Hospital Course:  ? ?Acute on chronic hypoxic and hypercarbic respiratory failure ?Acute on chronic diastolic CHF ?Severe OSA ?-He does not have a CPAP/BiPAP at home and was also volume overloaded on admission, previously has had sleep studies through the [place], was supposed to be set up with BiPAP 22/18 ?-Admitted to the ICU with hypercarbic respiratory failure, improving on BiPAP, diuretics ?-Echo with preserved EF, normal RV size and function noted, likely poor quality due  size ?-Diuresed with IV Lasix, he is 8.6 L negative, transitioned to oral Lasix, Aldactone and Farxiga ?-Discharged home in a stable condition, case management set up home trilogy for discharge today ?  ?Severe OSA ?-Supposed to be on BiPAP 22/18 with 3 L O2 at baseline ?-See discussion above ?  ?Morbid obesity ?-BMI 62, poor prognostic factor ?  ?Hyperglycemia, borderline diabetes ?HbA1c is 5.7 consistent with borderline diabetes ?-Started on SGLT2i ?  ?Hypertension ?-Losartan discontinued ? ?Discharge Exam: ?Vitals:  ? 01/29/22 0739 01/29/22 1150  ?BP: 121/72 113/63  ?Pulse: 84 79  ?Resp: 19 16  ?Temp: 98.1 ?F (36.7 ?C) 97.9 ?F (36.6 ?C)  ?SpO2: 95% 94%  ? ?General exam: Morbidly obese chronically ill male sitting up in bed, AAOx3, no distress ?HEENT: Neck obese unable to assess JVD ?CVS: S1-S2, regular rhythm ?Lungs: Distant breath sounds, otherwise clear ?Abdomen: Soft, obese, nontender, bowel sounds present  ?Extremities: Trace edema, chronic venous stasis and thickened skin ?Psychiatry:  Mood & affect appropriate ? ? ?Discharge Instructions ? ? ? ?Allergies as of 01/29/2022   ? ?   Reactions  ? Dog Epithelium   ? ?  ? ?  ?Medication List  ?  ? ?STOP taking these medications   ? ?amLODipine 5 MG tablet ?Commonly known as: NORVASC ?  ?benzonatate 100 MG capsule ?Commonly known as: TESSALON ?  ?losartan 50 MG tablet ?Commonly known as: COZAAR ?  ?predniSONE 20 MG tablet ?Commonly known as: DELTASONE ?  ? ?  ? ?TAKE these medications   ? ?albuterol 108 (90 Base) MCG/ACT inhaler ?  Commonly known as: VENTOLIN HFA ?Inhale 2 puffs into the lungs every 6 (six) hours as needed for wheezing or shortness of breath. ?  ?Bard Urinary Drainage Bag Misc ?1 application by Does not apply route in the morning and at bedtime. ?  ?fluticasone furoate-vilanterol 200-25 MCG/INH Aepb ?Commonly known as: BREO ELLIPTA ?Inhale 1 puff into the lungs daily. ?  ?furosemide 40 MG tablet ?Commonly known as: LASIX ?Take 1 tablet (40 mg total) by  mouth 2 (two) times daily. ?What changed: Another medication with the same name was removed. Continue taking this medication, and follow the directions you see here. ?  ?Gizmo Condom Catheter Misc ?1 application by Does not apply route in the morning and at bedtime. ?  ?ipratropium-albuterol 0.5-2.5 (3) MG/3ML Soln ?Commonly known as: DUONEB ?Take 3 mLs by nebulization every 6 (six) hours as needed (shortness of breath). ?  ?Jardiance 10 MG Tabs tablet ?Generic drug: empagliflozin ?Take 1 tablet (10 mg total) by mouth daily. ?  ?polyethylene glycol powder 17 GM/SCOOP powder ?Commonly known as: GLYCOLAX/MIRALAX ?Take 17 g by mouth daily as needed for moderate constipation. ?  ?sildenafil 100 MG tablet ?Commonly known as: Viagra ?Take 1 tablet (100 mg total) by mouth daily as needed for erectile dysfunction. ?  ?spironolactone 25 MG tablet ?Commonly known as: ALDACTONE ?Take 1 tablet (25 mg total) by mouth daily. ?  ? ?  ? ?  ?  ? ? ?  ?Durable Medical Equipment  ?(From admission, onward)  ?  ? ? ?  ? ?  Start     Ordered  ? 01/29/22 1004  For home use only DME Walker rolling  Once       ?Comments: Bariatric- 229.1 kg  ?Question Answer Comment  ?Walker: Other   ?Comments 2 wheels   ?Patient needs a walker to treat with the following condition Heart failure ([place])   ?  ? 01/29/22 1006  ? 01/29/22 1004  For home use only DME 3 n 1  Once       ?Comments: Bariatric  wt 229.1kg  ? 01/29/22 1006  ? ?  ?  ? ?  ? ?Allergies  ?Allergen Reactions  ? Dog Epithelium   ? ? Follow-up Information   ? ? [facility]. Go in 8 day(s).   ?Specialty: Cardiology ?Why: Hospital Follow up ?PLEASE bring a list of yopur medications ?FREE Valet parking, Entrance [address], off of [address]. ?Contact information: ?[address] ?[id number] [id number] ?[place] ?[phone] ? ?  ?  ? ? [facility] Follow up.   ?Why: Your home health has been set up with [facility]  known as [place]. The office will call you with start of service informtion. Please call number listed above for any questions or concerns. ?Contact information: ?[phone] ? ?  ?  ? ?  ?  ? ?  ? ? ? ?The results of significant diagnostics from this hospitalization (including imaging, microbiology, ancillary and laboratory) are listed below for reference.   ? ?Significant Diagnostic Studies: ?DG Chest 2 View ? ?Result Date: 01/01/2022 ?CLINICAL DATA:  SHOB EXAM: CHEST - 2 VIEW COMPARISON:  Chest x-ray 04/14/2021, CT chest 12/29/2020, chest x-ray 06/11/2019 FINDINGS: Stable enlarged cardiac silhouette. The heart and mediastinal contours are unchanged. Slightly less prominent hilar vasculature. No focal consolidation. No pulmonary edema. No pleural effusion. No pneumothorax. No acute osseous abnormality. IMPRESSION: Slightly improved venous vascular congestion with otherwise no  active cardiopulmonary disease. Electronically Signed   By: Iven Finn M.D.   On: 01/01/2022 21:39  ? ?DG Chest Port 1 View ? ?Result Date: 01/24/2022 ?CLINICAL DATA:  Pulmonary edema EXAM: PORTABLE CHEST 1 VIEW COMPARISON:  Chest radiograph one day prior FINDINGS: The heart and mediastinal contours are enlarged, unchanged. There is vascular congestion with increased interstitial markings bilaterally consistent with pulmonary edema, stable to minimally improved compared to the study from 1 day prior. There is no new or worsening focal airspace disease. There is no large pleural effusion. There is no pneumothorax There is no acute osseous abnormality. IMPRESSION: Increased interstitial markings bilaterally likely reflecting pulmonary edema, stable to slightly improved compared to the study from 1 day prior. No new or worsening focal airspace disease. No large pleural effusion. Electronically Signed   By: Valetta Mole M.D.   On: 01/24/2022 08:09  ? ?DG Chest Port 1 View ? ?Result Date: 01/23/2022 ?CLINICAL DATA:  Short of breath.   Altered mental status EXAM: PORTABLE CHEST 1 VIEW COMPARISON:  01/01/2022 FINDINGS: To low volume portable radiographs, limited by patient body habitus. Numerous leads and wires project over the chest.

## 2022-01-30 NOTE — TOC Transition Note (Signed)
Transition of Care (TOC) - CM/SW Discharge Note ? ? ?Patient Details  ?Name: Jeffrey Skinner ?MRN: [id number] ?Date of Birth: [birth year] ? ?Transition of Care (TOC) CM/SW Contact:  ?Beckie Busing, RN ?Phone Number:[phone] ? ?01/30/2022, 8:57 AM ? ? ?Clinical Narrative:    ?Patient discharging home today. CM received message from MD stating that patient will need portable tank for d/c. CM spoke with wife Jacob Cicero who confirms that she has a portable tank and will be able to bring it when she picks the patient up for discharge. No other TOC needs at this time. TOC will sign off.  ? ? ?Final next level of care: Home w Home Health Services ?Barriers to Discharge: Continued Medical Work up ? ? ?Patient Goals and CMS Choice ?Patient states their goals for this hospitalization and ongoing recovery are:: To go home ?CMS Medicare.gov Compare Post Acute Care list provided to:: Patient ?Choice offered to / list presented to : Patient ? ?Discharge Placement ?  ?           ?  ?  ?  ?  ? ?Discharge Plan and Services ?  ?Discharge Planning Services: CM Consult ?           ?DME Arranged: 3-N-1, Walker rolling (Bariatric) ?DME Agency: [employer] ?Date DME Agency Contacted: 01/29/22 ?Time DME Agency Contacted: 1013 ?Representative spoke with at DME Agency: Vaughan Basta ?HH Arranged: PT, OT ?HH Agency: [facility] ?Date HH Agency Contacted: 01/29/22 ?Time HH Agency Contacted: 1014 ?Representative spoke with at [facility] Agency: Gregary Signs ? ?Social Determinants of Health (SDOH) Interventions ?Food Insecurity Interventions: Intervention Not Indicated ?Financial Strain Interventions: Intervention Not Indicated ?Housing Interventions: Intervention Not Indicated ?Transportation Interventions: Intervention Not Indicated ? ? ?Readmission Risk Interventions ?   ? View : No data to display.  ?  ?  ?  ? ? ? ? ? ?

## 2022-01-30 NOTE — Plan of Care (Signed)

## 2022-01-30 NOTE — Progress Notes (Signed)
Physical Therapy Treatment ?Patient Details ?Name: Jeffrey Skinner ?MRN: [id number] ?DOB: 1955-12-27 ?Today's Date: 01/30/2022 ? ? ?History of Present Illness Pt is a 66 y/o male admitted secondary to fall out of bed and unresponsiveness. Found to have respiratory failure. PMH includes CHF, OSA, Obesity, repiratory failure on 3L O2. ? ?  ?PT Comments  ? ? Pt is making good, steady progress with mobility, ambulating up to ~76 ft without UE support with only min guard assist and no LOB. He continues to displays deficits in endurance and balance with noted trunk sway and need to sit to rest after gait bout though. Current recommendations remain appropriate. ?   ?Recommendations for follow up therapy are one component of a multi-disciplinary discharge planning process, led by the attending physician.  Recommendations may be updated based on patient status, additional functional criteria and insurance authorization. ? ?Follow Up Recommendations ? Home health PT ?  ?  ?Assistance Recommended at Discharge Frequent or constant Supervision/Assistance  ?Patient can return home with the following A little help with walking and/or transfers;A little help with bathing/dressing/bathroom;Assistance with cooking/housework;Help with stairs or ramp for entrance;Assist for transportation ?  ?Equipment Recommendations ? Other (comment);BSC/3in1;Rolling walker (2 wheels) (bariatric equipment)  ?  ?Recommendations for Other Services   ? ? ?  ?Precautions / Restrictions Precautions ?Precautions: Fall ?Precaution Comments: watch O2 ?Restrictions ?Weight Bearing Restrictions: No  ?  ? ?Mobility ? Bed Mobility ?Overal bed mobility: Needs Assistance ?Bed Mobility: Supine to Sit ?  ?  ?Supine to sit: Supervision, HOB elevated ?  ?  ?General bed mobility comments: Extra time with use of bed rail and HOB elevated. ?  ? ?Transfers ?Overall transfer level: Needs assistance ?Equipment used: None ?Transfers: Sit to/from Stand, Bed to  chair/wheelchair/BSC ?Sit to Stand: Min guard ?  ?  ?  ?  ?  ?General transfer comment: Min guard for safety, needing extra time once standing to ensure pt gained balance. ?  ? ?Ambulation/Gait ?Ambulation/Gait assistance: Min guard ?Gait Distance (Feet): 76 Feet ?Assistive device: None ?Gait Pattern/deviations: Step-through pattern, Decreased stride length, Wide base of support ?Gait velocity: reduced ?Gait velocity interpretation: <1.31 ft/sec, indicative of household ambulator ?  ?General Gait Details: Pt with short, slow wide steps with trunk sway but no LOB noted, min guard for safety. ? ? ?Stairs ?  ?  ?  ?  ?  ? ? ?Wheelchair Mobility ?  ? ?Modified Rankin (Stroke Patients Only) ?  ? ? ?  ?Balance Overall balance assessment: Needs assistance ?Sitting-balance support: No upper extremity supported ?Sitting balance-Leahy Scale: Fair ?  ?  ?Standing balance support: No upper extremity supported, During functional activity ?Standing balance-Leahy Scale: Fair ?Standing balance comment: Able to ambulate without UE support but instability without LOB noted ?  ?  ?  ?  ?  ?  ?  ?  ?  ?  ?  ?  ? ?  ?Cognition Arousal/Alertness: Awake/alert ?Behavior During Therapy: [facility] for tasks assessed/performed ?Overall Cognitive Status: Within Functional Limits for tasks assessed ?  ?  ?  ?  ?  ?  ?  ?  ?  ?  ?  ?  ?  ?  ?  ?  ?  ?  ?  ? ?  ?Exercises   ? ?  ?General Comments General comments (skin integrity, edema, etc.): HR up to 120 with activity; educated pt on elevating legs to manage edema ?  ?  ? ?Pertinent Vitals/Pain Pain Assessment ?Pain  Assessment: Faces ?Faces Pain Scale: Hurts a little bit ?Pain Location: generalized with movement ?Pain Descriptors / Indicators: Grimacing ?Pain Intervention(s): Monitored during session, Limited activity within patient's tolerance, Repositioned  ? ? ?Home Living   ?  ?  ?  ?  ?  ?  ?  ?  ?  ?   ?  ?Prior Function    ?  ?  ?   ? ?PT Goals (current goals can now be found in the care plan  section) Acute Rehab PT Goals ?Patient Stated Goal: to go home ?PT Goal Formulation: With patient ?Time For Goal Achievement: 02/08/22 ?Potential to Achieve Goals: Fair ?Progress towards PT goals: Progressing toward goals ? ?  ?Frequency ? ? ? Min 3X/week ? ? ? ?  ?PT Plan Current plan remains appropriate  ? ? ?Co-evaluation   ?  ?  ?  ?  ? ?  ?AM-PAC PT "6 Clicks" Mobility   ?Outcome Measure ? Help needed turning from your back to your side while in a flat bed without using bedrails?: A Little ?Help needed moving from lying on your back to sitting on the side of a flat bed without using bedrails?: A Little ?Help needed moving to and from a bed to a chair (including a wheelchair)?: A Little ?Help needed standing up from a chair using your arms (e.g., wheelchair or bedside chair)?: A Little ?Help needed to walk in hospital room?: A Little ?Help needed climbing 3-5 steps with a railing? : Total ?6 Click Score: 16 ? ?  ?End of Session Equipment Utilized During Treatment: Oxygen ?Activity Tolerance: Patient tolerated treatment well ?Patient left: in chair;with call bell/phone within reach ?Nurse Communication: Mobility status (NT) ?PT Visit Diagnosis: Unsteadiness on feet (R26.81);Muscle weakness (generalized) (M62.81);Other abnormalities of gait and mobility (R26.89);Difficulty in walking, not elsewhere classified (R26.2) ?  ? ? ?Time: 2694-8546 ?PT Time Calculation (min) (ACUTE ONLY): 24 min ? ?Charges:  $Gait Training: 8-22 mins ?$Therapeutic Activity: 8-22 mins          ?          ? ?Jeffrey Skinner, PT, DPT ?Acute Rehabilitation Services  ?Pager: [phone] ?Office: [phone] ? ? ? ?Jeffrey Skinner ?01/30/2022, 4:43 PM ? ?

## 2022-01-30 NOTE — Progress Notes (Signed)
Occupational Therapy Treatment ?Patient Details ?Name: Jeffrey Skinner ?MRN: [id number] ?DOB: 10/21/1955 ?Today's Date: 01/30/2022 ? ? ?History of present illness Pt is a 66 y/o male admitted secondary to fall out of bed and unresponsiveness. Found to have respiratory failure. PMH includes CHF, OSA, Obesity, repiratory failure on 3L O2. ?  ?OT comments ? Patient received preparing for discharge home. Patient assisted with dressing with patient requiring frequent rest breaks. Patient stood to use urinal without an assistive device and min guard for balance. Assisted mobility specialist with transfer to [facility] with difficulty getting into vehicle due to height and unable to position front seat to allow patient to sit in passenger side. Patient discharged home with patient expecting to receive HHOT.   ? ?Recommendations for follow up therapy are one component of a multi-disciplinary discharge planning process, led by the attending physician.  Recommendations may be updated based on patient status, additional functional criteria and insurance authorization. ?   ?Follow Up Recommendations ? Home health OT  ?  ?Assistance Recommended at Discharge Intermittent Supervision/Assistance  ?Patient can return home with the following ? A little help with walking and/or transfers;A little help with bathing/dressing/bathroom;Assistance with cooking/housework ?  ?Equipment Recommendations ? Tub/shower bench;BSC/3in1;Other (comment)  ?  ?Recommendations for Other Services   ? ?  ?Precautions / Restrictions Precautions ?Precautions: Fall ?Precaution Comments: watch O2 ?Restrictions ?Weight Bearing Restrictions: No  ? ? ?  ? ?Mobility Bed Mobility ?Overal bed mobility: Needs Assistance ?  ?  ?  ?  ?  ?  ?General bed mobility comments: seated in recliner ?  ? ?Transfers ?Overall transfer level: Needs assistance ?Equipment used: None ?Transfers: Sit to/from Stand, Bed to chair/wheelchair/BSC ?Sit to Stand: Min guard ?  ?  ?Step pivot transfers:  Min guard ?  ?  ?General transfer comment: transfer from recliner to transport chair ?  ?  ?Balance Overall balance assessment: Needs assistance ?Sitting-balance support: No upper extremity supported ?Sitting balance-Leahy Scale: Fair ?  ?  ?Standing balance support: Single extremity supported ?Standing balance-Leahy Scale: Poor ?Standing balance comment: able to stand for pull up pants and using urinal ?  ?  ?  ?  ?  ?  ?  ?  ?  ?  ?  ?   ? ?ADL either performed or assessed with clinical judgement  ? ?ADL Overall ADL's : Needs assistance/impaired ?  ?  ?  ?  ?  ?  ?  ?  ?Upper Body Dressing : Set up;Sitting ?Upper Body Dressing Details (indicate cue type and reason): donned T-shirt ?Lower Body Dressing: Moderate assistance ?Lower Body Dressing Details (indicate cue type and reason): to donn pants ?  ?  ?Toileting- Water quality scientist and Hygiene: Min guard ?Toileting - Clothing Manipulation Details (indicate cue type and reason): stood to use urinal with min guard assist for balance ?  ?  ?  ?General ADL Comments: patient required frequent rest breaks during dressing ?  ? ?Extremity/Trunk Assessment   ?  ?  ?  ?  ?  ? ?Vision   ?  ?  ?Perception   ?  ?Praxis   ?  ? ?Cognition Arousal/Alertness: Awake/alert ?Behavior During Therapy: [facility] for tasks assessed/performed ?Overall Cognitive Status: Impaired/Different from baseline ?  ?  ?  ?  ?  ?  ?  ?  ?  ?  ?  ?  ?  ?  ?  ?  ?General Comments: good recall of last session ?  ?  ?   ?  Exercises   ? ?  ?Shoulder Instructions   ? ? ?  ?General Comments assisted mobilility specialist with transfer into car/van  ? ? ?Pertinent Vitals/ Pain       Pain Assessment ?Pain Assessment: Faces ?Faces Pain Scale: Hurts a little bit ?Pain Location: generalized with movement ?Pain Descriptors / Indicators: Grimacing ?Pain Intervention(s): Monitored during session ? ?Home Living   ?  ?  ?  ?  ?  ?  ?  ?  ?  ?  ?  ?  ?  ?  ?  ?  ?  ?  ? ?  ?Prior Functioning/Environment    ?  ?  ?  ?    ? ?Frequency ?    ? ? ? ? ?  ?Progress Toward Goals ? ?OT Goals(current goals can now be found in the care plan section) ? Progress towards OT goals: Progressing toward goals ? ?Acute Rehab OT Goals ?Patient Stated Goal: go home ?OT Goal Formulation: With patient ?Time For Goal Achievement: 02/09/22 ?Potential to Achieve Goals: Good ?ADL Goals ?Pt Will Perform Lower Body Dressing: with set-up;sit to/from stand;with adaptive equipment ?Pt Will Transfer to Toilet: with supervision;ambulating ?Additional ADL Goal #1: Pt will use AE to independently wash his back and BLE. ?Additional ADL Goal #2: Pt will demonstrate independence with 3 fall prevention strategies for safe engagement in ADL/IADL and functional mobiltiy.  ?Plan Discharge plan remains appropriate   ? ?Co-evaluation ? ? ?   ?  ?  ?  ?  ? ?  ?AM-PAC OT "6 Clicks" Daily Activity     ?Outcome Measure ? ? Help from another person eating meals?: None ?Help from another person taking care of personal grooming?: A Little ?Help from another person toileting, which includes using toliet, bedpan, or urinal?: A Little ?Help from another person bathing (including washing, rinsing, drying)?: A Little ?Help from another person to put on and taking off regular upper body clothing?: A Little ?Help from another person to put on and taking off regular lower body clothing?: A Lot ?6 Click Score: 18 ? ?  ?End of Session Equipment Utilized During Treatment: Oxygen ? ?OT Visit Diagnosis: Unsteadiness on feet (R26.81);Other abnormalities of gait and mobility (R26.89);Muscle weakness (generalized) (M62.81) ?  ?Activity Tolerance Patient tolerated treatment well ?  ?Patient Left Other (comment) (in [place] with wife for discharge home) ?  ?Nurse Communication   ?  ? ?   ? ?Time: [id number] ?OT Time Calculation (min): 47 min ? ?Charges: OT General Charges ?$OT Visit: 1 Visit ?OT Treatments ?$Self Care/Home Management : 23-37 mins ?$Therapeutic Activity: 8-22 mins ? ?Lodema Hong, OTA ?Acute  Rehabilitation Services  ?Pager [phone] ?Office [phone] ? ? ?[place] ?01/30/2022, 12:54 PM ?

## 2022-02-06 ENCOUNTER — Telehealth (HOSPITAL_COMMUNITY): Payer: Self-pay | Admitting: *Deleted

## 2022-02-06 ENCOUNTER — Ambulatory Visit (HOSPITAL_COMMUNITY)
Admit: 2022-02-06 | Discharge: 2022-02-06 | Disposition: A | Discharge status: Home / Self Care | Payer: No Typology Code available for payment source | Attending: Cardiology | Admitting: Cardiology

## 2022-02-06 VITALS — BP 122/62 | HR 90 | Wt >= 6400 oz

## 2022-02-06 DIAGNOSIS — J449 Chronic obstructive pulmonary disease, unspecified: Secondary | ICD-10-CM | POA: Diagnosis not present

## 2022-02-06 DIAGNOSIS — G4733 Obstructive sleep apnea (adult) (pediatric): Secondary | ICD-10-CM | POA: Diagnosis not present

## 2022-02-06 DIAGNOSIS — Z7984 Long term (current) use of oral hypoglycemic drugs: Secondary | ICD-10-CM | POA: Insufficient documentation

## 2022-02-06 DIAGNOSIS — I110 Hypertensive heart disease with heart failure: Secondary | ICD-10-CM | POA: Insufficient documentation

## 2022-02-06 DIAGNOSIS — I5032 Chronic diastolic (congestive) heart failure: Secondary | ICD-10-CM | POA: Diagnosis not present

## 2022-02-06 DIAGNOSIS — Z79899 Other long term (current) drug therapy: Secondary | ICD-10-CM | POA: Diagnosis not present

## 2022-02-06 DIAGNOSIS — J9611 Chronic respiratory failure with hypoxia: Secondary | ICD-10-CM | POA: Diagnosis not present

## 2022-02-06 DIAGNOSIS — Z6841 Body Mass Index (BMI) 40.0 and over, adult: Secondary | ICD-10-CM | POA: Diagnosis not present

## 2022-02-06 DIAGNOSIS — I5033 Acute on chronic diastolic (congestive) heart failure: Secondary | ICD-10-CM | POA: Insufficient documentation

## 2022-02-06 LAB — BASIC METABOLIC PANEL
Anion gap: 4 — ABNORMAL LOW (ref 5–15)
BUN: 8 mg/dL (ref 8–23)
CO2: 33 mmol/L — ABNORMAL HIGH (ref 22–32)
Calcium: 9 mg/dL (ref 8.9–10.3)
Chloride: 102 mmol/L (ref 98–111)
Creatinine, Ser: 0.94 mg/dL (ref 0.61–1.24)
GFR, Estimated: >60 mL/min (ref >60)
Glucose, Bld: 142 mg/dL — ABNORMAL HIGH (ref 70–99)
Potassium: 4 mmol/L (ref 3.5–5.1)
Sodium: 139 mmol/L (ref 135–145)

## 2022-02-06 MED ORDER — SPIRONOLACTONE 25 MG PO TABS: 25.0000 mg | ORAL_TABLET | Freq: Every day | ORAL | 0 refills | Status: AC

## 2022-02-06 MED ORDER — FUROSEMIDE 40 MG PO TABS: 40.0000 mg | ORAL_TABLET | Freq: Two times a day (BID) | ORAL | 0 refills | Status: AC

## 2022-02-06 MED ORDER — EMPAGLIFLOZIN 10 MG PO TABS: 10.0000 mg | ORAL_TABLET | Freq: Every day | ORAL | 0 refills | Status: DC

## 2022-02-06 NOTE — Addendum Note (Signed)
Encounter addended by: Noralee Space, RN on: 02/06/2022 3:46 PM ? Actions taken: Pharmacy for encounter modified, Order list changed

## 2022-02-06 NOTE — Progress Notes (Signed)
? ? ?HEART & VASCULAR TRANSITION OF CARE CONSULT NOTE  ? ? ? ?Referring Physician: Dr. Jomarie Longs  ?Primary Care: Barbette Merino, NP ?[facility] Dr. Elease Hashimoto (seen in hospital) ? ?HPI: ?Referred to clinic by Dr. Jomarie Longs for heart failure consultation.  ? ?66 year old super morbidly obese African-American male, Body mass index is 63.32 kg/m?.,  With OSA/OHS, hypertension and chronic diastolic heart failure. ? ?Primarily followed by the [place] [facility] [place].  Reports diagnosis of obstructive sleep apnea roughly 2 years ago but had difficulty obtaining CPAP machine through the [place]. ? ?Recently admitted to [facility] for acute on chronic hypoxic/hypercarbic respiratory failure w/ lethargy and acute on chronic diastolic heart failure requiring BiPAP, in the setting of untreated OSA and poor compliance with home medications. ? ?Echocardiogram showed normal left ventricular systolic function, EF 60 to 65%, moderate LVH, RV normal with normal right ventricular systolic pressure.  No significant valvular disease. ? ?He was diuresed with IV Lasix and transition to oral Lasix 40 mg twice daily along with spironolactone and Jardiance. ? ?He was given CPAP device at discharge. Home O2 arranged, 3L/min.  ? ?He presents today for [facility] clinic follow-up.  Here with his wife.  Reports doing fairly well symptomatically.  Limited mobility due to morbid obesity.  Currently in wheelchair.  Fairly sedentary at baseline.  New York Heart Association Class II-III confounded by morbid obesity.  Denies resting dyspnea.  No orthopnea PND.  Fully compliant with CPAP.  Feels better after sleeping with it.  Reports full compliance with Lasix with good urinary output.  BP well controlled. ? ?There is huge discrepancy between today's weight and hospital discharge weight, however question whether or not hospital weights were accurate.  Patient reports that weights obtained in the hospital were bed weights and not  standing weights.  He denies any significant weight gain since returning home from the hospital. ? ?He wishes to transition his primary cardiac care locally. ? ? ? ?Cardiac Testing  ? ?2D echo 01/23/2022 ?Left ventricular ejection fraction, by estimation, is 60 to 65%. The left ventricle has ?normal function. The left ventricle has no regional wall motion abnormalities. There is ?moderate left ventricular hypertrophy. Left ventricular diastolic parameters were ?normal. ?1. ?Right ventricular systolic function is normal. The right ventricular size is normal. ?There is normal pulmonary artery systolic pressure. ?2. ?The mitral valve is normal in structure. No evidence of mitral valve regurgitation. No ?evidence of mitral stenosis. ?3. ?The aortic valve is tricuspid. Aortic valve regurgitation is not visualized. No aortic ?stenosis is present. ?4. ?Aortic dilatation noted. There is mild dilatation of the ascending aorta, measuring 37 ?mm. ?5. ?The inferior vena cava is dilated in size with <50% respiratory variability, suggesting ?right atrial pressure of 15 mmHg. ? ? ?Review of Systems: [y] = yes, [ ]  = no  ? ?General: Weight gain [ ] ; Weight loss [ ] ; Anorexia [ ] ; Fatigue [ ] ; Fever [ ] ; Chills [ ] ; Weakness [ ]   ?Cardiac: Chest pain/pressure [ ] ; Resting SOB [ ] ; Exertional SOB [Y ]; Orthopnea [ ] ; Pedal Edema [ ] ; Palpitations [ ] ; Syncope [ ] ; Presyncope [ ] ; Paroxysmal nocturnal dyspnea[ ]   ?Pulmonary: Cough [ ] ; Wheezing[ ] ; Hemoptysis[ ] ; Sputum [ ] ; Snoring [ ]   ?GI: Vomiting[ ] ; Dysphagia[ ] ; Melena[ ] ; Hematochezia [ ] ; Heartburn[ ] ; Abdominal pain [ ] ; Constipation [ ] ; Diarrhea [ ] ; BRBPR [ ]   ?GU: Hematuria[ ] ; Dysuria [ ] ; Nocturia[ ]   ?Vascular: Pain  in legs with walking [ ] ; Pain in feet with lying flat [ ] ; Non-healing sores [ ] ; Stroke [ ] ; TIA [ ] ; Slurred speech [ ] ;  ?Neuro: Headaches[ ] ; Vertigo[ ] ; Seizures[ ] ; Paresthesias[ ] ;Blurred vision [ ] ; Diplopia [ ] ; Vision changes [ ]   ?Ortho/Skin:  Arthritis [ ] ; Joint pain [ Y]; Muscle pain [ ] ; Joint swelling [ ] ; Back Pain [ ] ; Rash [ ]   ?Psych: Depression[ ] ; Anxiety[ ]   ?Heme: Bleeding problems [ ] ; Clotting disorders [ ] ; Anemia [ ]   ?Endocrine: Diabetes [ ] ; Thyroid dysfunction[ ]  ? ? ?Past Medical History:  ?Diagnosis Date  ? Acute diastolic CHF (congestive heart failure) (HCC) 06/14/2019  ? Anxiety 12/16/2019  ? Anxiety 11/2019  ? Arthritis   ? COPD (chronic obstructive pulmonary disease) (HCC) 12/30/2020  ? Depression   ? Dyspnea   ? Hypertension   ? Insomnia 11/2029  ? Morbid obesity with BMI of 60.0-69.9, adult (HCC)   ? Sleep apnea   ? Vitamin D deficiency 06/2019  ? ? ?Current Outpatient Medications  ?Medication Sig Dispense Refill  ? albuterol (VENTOLIN HFA) 108 (90 Base) MCG/ACT inhaler Inhale 2 puffs into the lungs every 6 (six) hours as needed for wheezing or shortness of breath.    ? Catheters (GIZMO CONDOM CATHETER) MISC 1 application by Does not apply route in the morning and at bedtime. 100 each 11  ? empagliflozin (JARDIANCE) 10 MG TABS tablet Take 1 tablet (10 mg total) by mouth daily. 30 tablet 0  ? fluticasone furoate-vilanterol (BREO ELLIPTA) 200-25 MCG/INH AEPB Inhale 1 puff into the lungs daily. 60 each 1  ? furosemide (LASIX) 40 MG tablet Take 1 tablet (40 mg total) by mouth 2 (two) times daily. 60 tablet 0  ? Incontinence Supplies (BARD URINARY DRAINAGE BAG) MISC 1 application by Does not apply route in the morning and at bedtime. 20 each 11  ? polyethylene glycol powder (GLYCOLAX/MIRALAX) 17 GM/SCOOP powder Take 17 g by mouth daily as needed for moderate constipation. 238 g 0  ? sildenafil (VIAGRA) 100 MG tablet Take 1 tablet (100 mg total) by mouth daily as needed for erectile dysfunction. 10 tablet 0  ? spironolactone (ALDACTONE) 25 MG tablet Take 1 tablet (25 mg total) by mouth daily. 30 tablet 0  ? ipratropium-albuterol (DUONEB) 0.5-2.5 (3) MG/3ML SOLN Take 3 mLs by nebulization every 6 (six) hours as needed (shortness of  breath). 60 mL 1  ? ?No current facility-administered medications for this encounter.  ? ? ?Allergies  ?Allergen Reactions  ? Dog Epithelium   ? ? ?  ?Social History  ? ?Socioeconomic History  ? Marital status: Married  ?  Spouse name: Erie NoeVanessa  ? Number of children: 2  ? Years of education: Not on file  ? Highest education level: High school graduate  ?Occupational History  ? Not on file  ?Tobacco Use  ? Smoking status: Never  ? Smokeless tobacco: Never  ?Vaping Use  ? Vaping Use: Never used  ?Substance and Sexual Activity  ? Alcohol use: Yes  ?  Alcohol/week: 1.0 standard drink  ?  Types: 1 Shots of liquor per week  ?  Comment: - once a month  ? Drug use: Yes  ?  Types: Marijuana  ?  Comment: High School  ? Sexual activity: Not Currently  ?Other Topics Concern  ? Not on file  ?Social History Narrative  ? Not on file  ? ?Social Determinants of Health  ? ?Financial Resource Strain:  Low Risk   ? Difficulty of Paying Living Expenses: Not hard at all  ?Food Insecurity: No Food Insecurity  ? Worried About Programme researcher, broadcasting/film/video in the Last Year: Never true  ? Ran Out of Food in the Last Year: Never true  ?Transportation Needs: No Transportation Needs  ? Lack of Transportation (Medical): No  ? Lack of Transportation (Non-Medical): No  ?Physical Activity: Not on file  ?Stress: Not on file  ?Social Connections: Not on file  ?Intimate Partner Violence: Not on file  ? ? ?  ?Family History  ?Problem Relation Age of Onset  ? Rheum arthritis Mother   ? Arthritis Sister   ? Cancer - Lung Brother   ? Heart disease Brother   ? ? ?Vitals:  ? 02/06/22 1011  ?BP: 122/62  ?Pulse: 90  ?SpO2: 95%  ?Weight: (!) 242.2 kg  ? ? ?PHYSICAL EXAM: ?General: Super morbidly obese, well-appearing.  In wheelchair, no respiratory difficulty ?HEENT: normal ?Neck: supple.  Thick neck, JVD not well visualized  Carotids 2+ bilat; no bruits. No lymphadenopathy or thryomegaly appreciated. ?Cor: PMI nondisplaced. Regular rate & rhythm. No rubs, gallops or  murmurs. ?Lungs: clear ?Abdomen: Obese soft, nontender, nondistended. No hepatosplenomegaly. No bruits or masses. Good bowel sounds. ?Extremities: no cyanosis, clubbing, rash, obese extremities, no pitting edema  ?Neuro:

## 2022-02-06 NOTE — Telephone Encounter (Signed)
Heart Failure Nurse Navigator Progress Note  ? ? ?Confirmed with patient appointment for 02/06/22 @ 10 am. Reminded patient of the address and what to bring with him.  ? ?Rhae Hammock, BSN, RN ?Heart Failure Nurse Navigator ?Secure Chat Only  ?

## 2022-02-06 NOTE — Patient Instructions (Signed)
Continue current medications ? ?Thank you for allowing [place] to provider your heart failure care after your recent hospitalization. Please follow-up with Dr Elease Hashimoto at [facility], his office will call you for an appointment ? ?You have been referred to Dr Craige Cotta at [facility], they will call you for an appointment ? ?If you have any questions, issues, or concerns before your next appointment please call our office at [phone], opt. 2 and leave a message for the triage nurse. ? ?

## 2022-03-04 ENCOUNTER — Encounter: Payer: Self-pay | Admitting: Pulmonary Disease

## 2022-03-04 ENCOUNTER — Ambulatory Visit (INDEPENDENT_AMBULATORY_CARE_PROVIDER_SITE_OTHER): Payer: No Typology Code available for payment source | Admitting: Pulmonary Disease

## 2022-03-04 VITALS — BP 122/76 | HR 83 | Temp 98.2°F | Ht 76.5 in | Wt >= 6400 oz

## 2022-03-04 DIAGNOSIS — R0602 Shortness of breath: Secondary | ICD-10-CM

## 2022-03-04 NOTE — Progress Notes (Signed)
Jeffrey Skinner    [id number]    15-Nov-1955  Primary Care Physician:King, Shana Chute, NP  Referring Physician: Roland Rack, MD [address] [place],  [place] [address]  Chief complaint:   Shortness of breath  HPI:  History of obstructive sleep apnea/obesity hypoventilation syndrome, hypertension, diastolic heart failure  Recent hospitalization for hypoxic/hypercarbic respiratory failure  History of noncompliance  Since recent hospitalization has managed to lose some weight  He does have home oxygen, discharged home with a CPAP device  Usually tries to go to bed between 11 and 12, wakes up about 8 AM Multiple awakenings  He feels rested in the mornings  He is feeling better generally Trying to get more active  Denies shortness of breath at rest  No pertinent occupational history  Outpatient Encounter Medications as of 03/04/2022  Medication Sig   albuterol (VENTOLIN HFA) 108 (90 Base) MCG/ACT inhaler Inhale 2 puffs into the lungs every 6 (six) hours as needed for wheezing or shortness of breath.   Catheters (GIZMO CONDOM CATHETER) MISC 1 application by Does not apply route in the morning and at bedtime.   empagliflozin (JARDIANCE) 10 MG TABS tablet Take 1 tablet (10 mg total) by mouth daily.   fluticasone furoate-vilanterol (BREO ELLIPTA) 200-25 MCG/INH AEPB Inhale 1 puff into the lungs daily.   furosemide (LASIX) 40 MG tablet Take 1 tablet (40 mg total) by mouth 2 (two) times daily.   Incontinence Supplies (BARD URINARY DRAINAGE BAG) MISC 1 application by Does not apply route in the morning and at bedtime.   polyethylene glycol powder (GLYCOLAX/MIRALAX) 17 GM/SCOOP powder Take 17 g by mouth daily as needed for moderate constipation.   sildenafil (VIAGRA) 100 MG tablet Take 1 tablet (100 mg total) by mouth daily as needed for erectile dysfunction.   spironolactone (ALDACTONE) 25 MG tablet Take 1 tablet (25 mg total) by mouth daily.    ipratropium-albuterol (DUONEB) 0.5-2.5 (3) MG/3ML SOLN Take 3 mLs by nebulization every 6 (six) hours as needed (shortness of breath).   No facility-administered encounter medications on file as of 03/04/2022.    Allergies as of 03/04/2022 - Review Complete 03/04/2022  Allergen Reaction Noted   Dog epithelium Other (See Comments) 02/02/2020    Past Medical History:  Diagnosis Date   Acute diastolic CHF (congestive heart failure) (HCC) 06/14/2019   Anxiety 12/16/2019   Anxiety 11/2019   Arthritis    COPD (chronic obstructive pulmonary disease) (HCC) 12/30/2020   Depression    Dyspnea    Hypertension    Insomnia 11/2029   Morbid obesity with BMI of 60.0-69.9, adult (HCC)    Sleep apnea    Vitamin D deficiency 06/2019    Past Surgical History:  Procedure Laterality Date   APPENDECTOMY     CHOLECYSTECTOMY     HERNIA REPAIR     VASECTOMY  1998    Family History  Problem Relation Age of Onset   Rheum arthritis Mother    Arthritis Sister    Cancer - Lung Brother    Heart disease Brother     Social History   Socioeconomic History   Marital status: Married    Spouse name: Erie Noe   Number of children: 2   Years of education: Not on file   Highest education level: High school graduate  Occupational History   Not on file  Tobacco Use   Smoking status: Never   Smokeless tobacco: Never  Vaping Use  Vaping Use: Never used  Substance and Sexual Activity   Alcohol use: Yes    Alcohol/week: 1.0 standard drink    Types: 1 Shots of liquor per week    Comment: - once a month   Drug use: Yes    Types: Marijuana    Comment: High School   Sexual activity: Not Currently  Other Topics Concern   Not on file  Social History Narrative   Not on file   Social Determinants of Health   Financial Resource Strain: Low Risk    Difficulty of Paying Living Expenses: Not hard at all  Food Insecurity: No Food Insecurity   Worried About Programme researcher, broadcasting/film/video in the Last Year: Never true    Ran Out of Food in the Last Year: Never true  Transportation Needs: No Transportation Needs   Lack of Transportation (Medical): No   Lack of Transportation (Non-Medical): No  Physical Activity: Not on file  Stress: Not on file  Social Connections: Not on file  Intimate Partner Violence: Not on file    Review of Systems  Constitutional:  Negative for fatigue.  Respiratory:  Positive for shortness of breath.   Psychiatric/Behavioral:  Positive for sleep disturbance.     There were no vitals filed for this visit.   Physical Exam Constitutional:      Appearance: He is obese.  HENT:     Head: Normocephalic.     Nose: No congestion.     Mouth/Throat:     Mouth: Mucous membranes are moist.  Cardiovascular:     Rate and Rhythm: Normal rate and regular rhythm.     Heart sounds: No murmur heard.    No friction rub.  Pulmonary:     Effort: No respiratory distress.     Breath sounds: No stridor. No wheezing or rhonchi.  Musculoskeletal:        General: Swelling present.     Cervical back: No rigidity or tenderness.     Right lower leg: Edema present.     Left lower leg: Edema present.  Neurological:     Mental Status: He is alert.  Psychiatric:        Mood and Affect: Mood normal.      Data Reviewed: Most recent chest x-ray 01/24/2022 consistent with pulmonary edema Most recent echocardiogram 01/23/2022 with ejection fraction of 60 to 65%, normal right ventricular systolic function  Assessment:  Shortness of breath  Hypoxemic respiratory failure  History of hypercapnic respiratory failure -His bicarbonate is trending well  History of congestive heart failure -Tolerating diuresis well   Plan/Recommendations: Follow-up in about 4 weeks  Graded exercise as tolerated  Ambulatory pulse oximetry check to qualify for oxygen if needed  Continue current inhaler-Breo  Use nebulizer as needed  Continue weight loss efforts   Virl Diamond MD [facility] 03/04/2022, 11:22 AM  CC: Roland Rack, MD

## 2022-03-04 NOTE — Patient Instructions (Signed)
I will see you in about 4 weeks  Continue graded exercise as tolerated  Check your oxygen on a regular basis to make sure that saturations are staying above 90%  You can call us in about a week or so to have a walking qualification for oxygen supplementation  Continue inhaler -Breo  Nebulizer use as needed  Continue weight loss efforts  Call us with significant concerns

## 2022-03-12 ENCOUNTER — Encounter: Payer: Self-pay | Admitting: Cardiovascular Disease

## 2022-03-12 ENCOUNTER — Ambulatory Visit (INDEPENDENT_AMBULATORY_CARE_PROVIDER_SITE_OTHER): Payer: 59 | Admitting: Cardiovascular Disease

## 2022-03-12 DIAGNOSIS — I10 Essential (primary) hypertension: Secondary | ICD-10-CM

## 2022-03-12 MED ORDER — METOPROLOL SUCCINATE ER 25 MG PO TB24: 25.0000 mg | ORAL_TABLET | Freq: Every day | ORAL | 3 refills | Status: DC

## 2022-03-12 NOTE — Patient Instructions (Addendum)
  Medication Instructions:  START metoprolol succinate (Toprol XL) 25mg  daily *If you need a refill on your cardiac medications before your next appointment, please call your pharmacy*   Lab Work: BMET Today If you have labs (blood work) drawn today and your tests are completely normal, you will receive your results only by: MyChart Message (if you have MyChart) OR A paper copy in the mail If you have any lab test that is abnormal or we need to change your treatment, we will call you to review the results.   Testing/Procedures: NONE   Follow-Up: At [facility], you and your health needs are our priority.  As part of our continuing mission to provide you with exceptional heart care, we have created designated Provider Care Teams.  These Care Teams include your primary Cardiologist (physician) and Advanced Practice Providers (APPs -  Physician Assistants and Nurse Practitioners) who all work together to provide you with the care you need, when you need it.  We recommend signing up for the patient portal called "MyChart".  Sign up information is provided on this After Visit Summary.  MyChart is used to connect with patients for Virtual Visits (Telemedicine).  Patients are able to view lab/test results, encounter notes, upcoming appointments, etc.  Non-urgent messages can be sent to your provider as well.   To learn more about what you can do with MyChart, go to [facility].    Your next appointment:   6 month(s)  The format for your next appointment:   In Person  Provider:   [email or web address], [email or web address] {  Important Information About Sugar

## 2022-03-12 NOTE — Progress Notes (Signed)
Cardiology Office Note:    Date:  03/12/2022   ID:  Jeffrey Skinner, DOB December 14, 1955, MRN [id number]  PCP:  Vevelyn Francois, NP   [facility] Providers Cardiologist:  Christiano Blandon   Referring MD: Lawernce Ion*   Chief Complaint  Patient presents with   Congestive Heart Failure          History of Present Illness:    Jeffrey Skinner is a 66 y.o. male with a hx of   acute on chronic diastolic CHF, morbid obesity , HTN   Goes to the [place] [facility] in [place],  I met him in the hospital in April, 2022.  He has obstructive sleep apnea. He uses a CPAP regularly.  He did not have his CPAP when he was admitted in April, 2022.  Echocardiogram in April, 2022 showed normal of left ventricular systolic function and normal LV diastolic function.  He was admitted in April, 2023 with acute respiratory failure.  He had hypercarbic respiratory failure.  He improved on BiPAP.  Echo at that time revealed preserved left ventricular systolic function his diastolic function was also normal. Mild - mod TR   Was diuresed 8.6 liters  Avoids salt  Eats lots of carbs ( cakes, cookies, bread )  Has lost 28 lbs since he was discharged from the hospital   Some walking  Working out with PT, OT   Runs a food truck  Past Medical History:  Diagnosis Date   Acute diastolic CHF (congestive heart failure) ([place]) 06/14/2019   Anxiety 12/16/2019   Anxiety 11/2019   Arthritis    COPD (chronic obstructive pulmonary disease) ([place]) 12/30/2020   Depression    Dyspnea    Hypertension    Insomnia 11/2029   Morbid obesity with BMI of 60.0-69.9, adult ([place])    Sleep apnea    Vitamin D deficiency 06/2019    Past Surgical History:  Procedure Laterality Date   APPENDECTOMY     CHOLECYSTECTOMY     HERNIA REPAIR     VASECTOMY  1998    Current Medications: Current Meds  Medication Sig   albuterol (VENTOLIN HFA) 108 (90 Base) MCG/ACT inhaler Inhale 2 puffs into the lungs every 6 (six) hours as  needed for wheezing or shortness of breath.   Catheters (GIZMO CONDOM CATHETER) MISC 1 application by Does not apply route in the morning and at bedtime.   furosemide (LASIX) 40 MG tablet Take 1 tablet (40 mg total) by mouth 2 (two) times daily.   Incontinence Supplies (BARD URINARY DRAINAGE BAG) MISC 1 application by Does not apply route in the morning and at bedtime.   ipratropium-albuterol (DUONEB) 0.5-2.5 (3) MG/3ML SOLN Take 3 mLs by nebulization every 6 (six) hours as needed (shortness of breath).   metoprolol succinate (TOPROL XL) 25 MG 24 hr tablet Take 1 tablet (25 mg total) by mouth daily.   spironolactone (ALDACTONE) 25 MG tablet Take 1 tablet (25 mg total) by mouth daily.     Allergies:   Dog epithelium   Social History   Socioeconomic History   Marital status: Married    Spouse name: Jeffrey Skinner   Number of children: 2   Years of education: Not on file   Highest education level: High school graduate  Occupational History   Not on file  Tobacco Use   Smoking status: Never   Smokeless tobacco: Never  Vaping Use   Vaping Use: Never used  Substance and Sexual Activity   Alcohol  use: Yes    Alcohol/week: 1.0 standard drink of alcohol    Types: 1 Shots of liquor per week    Comment: - once a month   Drug use: Yes    Types: Marijuana    Comment: High School   Sexual activity: Not Currently  Other Topics Concern   Not on file  Social History Narrative   Not on file   Social Determinants of Health   Financial Resource Strain: Low Risk  (01/28/2022)   Overall Financial Resource Strain (CARDIA)    Difficulty of Paying Living Expenses: Not hard at all  Food Insecurity: No Food Insecurity (01/28/2022)   Hunger Vital Sign    Worried About Running Out of Food in the Last Year: Never true    Ran Out of Food in the Last Year: Never true  Transportation Needs: No Transportation Needs (01/28/2022)   PRAPARE - Hydrologist (Medical): No    Lack of  Transportation (Non-Medical): No  Physical Activity: Inactive (06/09/2019)   Exercise Vital Sign    Days of Exercise per Week: 0 days    Minutes of Exercise per Session: 0 min  Stress: Stress Concern Present (06/09/2019)   [place]    Feeling of Stress : Rather much  Social Connections: Moderately Isolated (06/09/2019)   Social Connection and Isolation Panel [NHANES]    Frequency of Communication with Friends and Family: More than three times a week    Frequency of Social Gatherings with Friends and Family: Three times a week    Attends Religious Services: Never    Active Member of Clubs or Organizations: No    Attends Archivist Meetings: Never    Marital Status: Married     Family History: The patient's family history includes Arthritis in his sister; Cancer - Lung in his brother; Heart disease in his brother; Rheum arthritis in his mother.  ROS:   Please see the history of present illness.     All other systems reviewed and are negative.  EKGs/Labs/Other Studies Reviewed:    The following studies were reviewed today:   EKG:  EKG is  ordered today.  The ekg ordered today demonstrates   Recent Labs: 01/23/2022: ALT 15; B Natriuretic Peptide 19.1 01/27/2022: Magnesium 1.8 01/28/2022: Hemoglobin 11.7; Platelets 167 02/06/2022: BUN 8; Creatinine, Ser 0.94; Potassium 4.0; Sodium 139  Recent Lipid Panel    Component Value Date/Time   CHOL 161 06/23/2019 1459   TRIG 58 06/23/2019 1459   HDL 39 (L) 06/23/2019 1459   CHOLHDL 4.1 06/23/2019 1459   CHOLHDL 3.9 01/09/2017 0138   VLDL 9 01/09/2017 0138   LDLCALC 110 (H) 06/23/2019 1459     Risk Assessment/Calculations:           Physical Exam:    VS:  BP 131/78   Pulse 96   Ht 6' 4.5" (1.943 m)   Wt (!) 505 lb (229.1 kg)   SpO2 94%   BMI 60.67 kg/m     Wt Readings from Last 3 Encounters:  03/12/22 (!) 505 lb (229.1 kg)  03/04/22 (!) 505 lb  (229.1 kg)  02/06/22 (!) 534 lb (242.2 kg)     GEN:  middle age male, morbidly obese.  HEENT: Normal NECK: No JVD; No carotid bruits LYMPHATICS: No lymphadenopathy CARDIAC: RRR, no murmurs, rubs, gallops RESPIRATORY:  Clear to auscultation without rales, wheezing or rhonchi  ABDOMEN: Soft, non-tender, non-distended MUSCULOSKELETAL:  No edema; chronic stasis changes SKIN: Warm and dry NEUROLOGIC:  Alert and oriented x 3 PSYCHIATRIC:  Normal affect   ASSESSMENT:    1. Morbid obesity ([place])   2. Primary hypertension    PLAN:    In order of problems listed above:  Obesity hypoventilation: Vasily presents for further evaluation of his obesity hypoventilation.  He has done well on Lasix and spironolactone.  His heart rate is still a little fast so we will add low-dose Toprol-XL.  His cardiac function is doing as well as it can but I suspect that his cardiac output is inadequate for his body size.   Echocardiogram reveals normal left ventricular systolic and normal left ventricular diastolic function.  I suspect that his symptoms are almost solely due to obesity hypoventilation.   We will have him follow-up with Richardson Dopp, PA in 6 months.  2.  Super morbid obesity:-Courage him to work on weight loss.  Continue carb restriction.          Medication Adjustments/Labs and Tests Ordered: Current medicines are reviewed at length with the patient today.  Concerns regarding medicines are outlined above.  Orders Placed This Encounter  Procedures   Basic metabolic panel   Meds ordered this encounter  Medications   metoprolol succinate (TOPROL XL) 25 MG 24 hr tablet    Sig: Take 1 tablet (25 mg total) by mouth daily.    Dispense:  90 tablet    Refill:  3    Patient Instructions   Medication Instructions:  START metoprolol succinate (Toprol XL) 66m daily *If you need a refill on your cardiac medications before your next appointment, please call your pharmacy*   Lab  Work: BMET Today If you have labs (blood work) drawn today and your tests are completely normal, you will receive your results only by: [place](if you have MyChart) OR A paper copy in the mail If you have any lab test that is abnormal or we need to change your treatment, we will call you to review the results.   Testing/Procedures: NONE   Follow-Up: [facility] and your health needs are our priority.  As part of our continuing mission to provide you with exceptional heart care, we have created designated Provider Care Teams.  These Care Teams include your primary Cardiologist (physician) and Advanced Practice Providers (APPs -  Physician Assistants and Nurse Practitioners) who all work together to provide you with the care you need, when you need it.  We recommend signing up for the patient portal called "MyChart".  Sign up information is provided on this After Visit Summary.  MyChart is used to connect with patients for Virtual Visits (Telemedicine).  Patients are able to view lab/test results, encounter notes, upcoming appointments, etc.  Non-urgent messages can be sent to your provider as well.   To learn more about what you can do with MyChart, go to [email or web address]    Your next appointment:   6 month(s)  The format for your next appointment:   In Person  Provider:   SRichardson Dopp PA {  Important Information About Sugar         Signed, PMertie Moores MD  03/12/2022 5:08 PM    [place]

## 2022-03-13 LAB — BASIC METABOLIC PANEL
BUN/Creatinine Ratio: 17 (ref 10–24)
BUN: 16 mg/dL (ref 8–27)
CO2: 23 mmol/L (ref 20–29)
Calcium: 9.1 mg/dL (ref 8.6–10.2)
Chloride: 101 mmol/L (ref 96–106)
Creatinine, Ser: 0.94 mg/dL (ref 0.76–1.27)
Glucose: 119 mg/dL — ABNORMAL HIGH (ref 70–99)
Potassium: 4.1 mmol/L (ref 3.5–5.2)
Sodium: 139 mmol/L (ref 134–144)
eGFR: 91 mL/min/{1.73_m2} (ref >59)

## 2022-03-14 ENCOUNTER — Encounter: Payer: Self-pay | Admitting: Pulmonary Disease

## 2022-03-14 ENCOUNTER — Telehealth: Payer: Self-pay | Admitting: Pulmonary Disease

## 2022-03-14 DIAGNOSIS — I5031 Acute diastolic (congestive) heart failure: Secondary | ICD-10-CM

## 2022-03-14 DIAGNOSIS — J9691 Respiratory failure, unspecified with hypoxia: Secondary | ICD-10-CM

## 2022-03-14 DIAGNOSIS — I10 Essential (primary) hypertension: Secondary | ICD-10-CM

## 2022-03-15 NOTE — Telephone Encounter (Signed)
Referral to Cardiology has been placed. Nothing further needed at this time.

## 2022-03-15 NOTE — Telephone Encounter (Signed)
Okay to place referral to cardiology

## 2022-03-15 NOTE — Telephone Encounter (Signed)
Looked at patient and did not see a cardiology referral from Dr. Wynona Neat.   Dr. Wynona Neat, can you please advise? Thanks!

## 2022-03-20 ENCOUNTER — Telehealth: Payer: Self-pay | Admitting: Pulmonary Disease

## 2022-03-20 NOTE — Telephone Encounter (Signed)
Called Jeffrey Skinner back and looks like this may have been sent in error patient saw Cardiology at [facility] on 03/12/2022

## 2022-03-27 NOTE — Telephone Encounter (Signed)
Noted     Closing encounter

## 2022-04-16 ENCOUNTER — Ambulatory Visit: Payer: 59 | Admitting: Pulmonary Disease

## 2023-03-02 ENCOUNTER — Other Ambulatory Visit: Payer: Self-pay

## 2023-03-02 ENCOUNTER — Emergency Department (HOSPITAL_COMMUNITY)
Admission: EM | Admit: 2023-03-02 | Discharge: 2023-03-02 | Disposition: A | Discharge status: Home / Self Care | Payer: No Typology Code available for payment source | Attending: Emergency Medicine | Admitting: Emergency Medicine

## 2023-03-02 ENCOUNTER — Emergency Department (HOSPITAL_COMMUNITY): Payer: No Typology Code available for payment source

## 2023-03-02 DIAGNOSIS — I5031 Acute diastolic (congestive) heart failure: Secondary | ICD-10-CM | POA: Insufficient documentation

## 2023-03-02 DIAGNOSIS — Z7984 Long term (current) use of oral hypoglycemic drugs: Secondary | ICD-10-CM | POA: Insufficient documentation

## 2023-03-02 DIAGNOSIS — R1011 Right upper quadrant pain: Secondary | ICD-10-CM | POA: Diagnosis present

## 2023-03-02 DIAGNOSIS — J449 Chronic obstructive pulmonary disease, unspecified: Secondary | ICD-10-CM | POA: Insufficient documentation

## 2023-03-02 DIAGNOSIS — Z79899 Other long term (current) drug therapy: Secondary | ICD-10-CM | POA: Insufficient documentation

## 2023-03-02 DIAGNOSIS — N201 Calculus of ureter: Secondary | ICD-10-CM | POA: Insufficient documentation

## 2023-03-02 DIAGNOSIS — I110 Hypertensive heart disease with heart failure: Secondary | ICD-10-CM | POA: Diagnosis not present

## 2023-03-02 DIAGNOSIS — Z7951 Long term (current) use of inhaled steroids: Secondary | ICD-10-CM | POA: Diagnosis not present

## 2023-03-02 DIAGNOSIS — D72829 Elevated white blood cell count, unspecified: Secondary | ICD-10-CM | POA: Insufficient documentation

## 2023-03-02 LAB — COMPREHENSIVE METABOLIC PANEL
ALT: 18 U/L (ref 0–44)
AST: 26 U/L (ref 15–41)
Albumin: 3.6 g/dL (ref 3.5–5.0)
Alkaline Phosphatase: 72 U/L (ref 38–126)
Anion gap: 9 (ref 5–15)
BUN: 9 mg/dL (ref 8–23)
CO2: 30 mmol/L (ref 22–32)
Calcium: 8.5 mg/dL — ABNORMAL LOW (ref 8.9–10.3)
Chloride: 97 mmol/L — ABNORMAL LOW (ref 98–111)
Creatinine, Ser: 1.15 mg/dL (ref 0.61–1.24)
GFR, Estimated: >60 mL/min (ref >60)
Glucose, Bld: 139 mg/dL — ABNORMAL HIGH (ref 70–99)
Potassium: 5 mmol/L (ref 3.5–5.1)
Sodium: 136 mmol/L (ref 135–145)
Total Bilirubin: 1.5 mg/dL — ABNORMAL HIGH (ref 0.3–1.2)
Total Protein: 8.4 g/dL — ABNORMAL HIGH (ref 6.5–8.1)

## 2023-03-02 LAB — URINALYSIS, ROUTINE W REFLEX MICROSCOPIC
Bacteria, UA: NONE SEEN
Bilirubin Urine: NEGATIVE
Glucose, UA: NEGATIVE mg/dL
Ketones, ur: NEGATIVE mg/dL
Leukocytes,Ua: NEGATIVE
Nitrite: NEGATIVE
Protein, ur: NEGATIVE mg/dL
RBC / HPF: >50 RBC/hpf (ref 0–5)
Specific Gravity, Urine: 1.025 (ref 1.005–1.030)
pH: 6 (ref 5.0–8.0)

## 2023-03-02 LAB — CBC WITH DIFFERENTIAL/PLATELET
Abs Immature Granulocytes: 0.04 10*3/uL (ref 0.00–0.07)
Basophils Absolute: 0.1 10*3/uL (ref 0.0–0.1)
Basophils Relative: 1 %
Eosinophils Absolute: 0.1 10*3/uL (ref 0.0–0.5)
Eosinophils Relative: 1 %
HCT: 45.3 % (ref 39.0–52.0)
Hemoglobin: 14 g/dL (ref 13.0–17.0)
Immature Granulocytes: 0 %
Lymphocytes Relative: 10 %
Lymphs Abs: 1.2 10*3/uL (ref 0.7–4.0)
MCH: 29.2 pg (ref 26.0–34.0)
MCHC: 30.9 g/dL (ref 30.0–36.0)
MCV: 94.4 fL (ref 80.0–100.0)
Monocytes Absolute: 1.3 10*3/uL — ABNORMAL HIGH (ref 0.1–1.0)
Monocytes Relative: 11 %
Neutro Abs: 9.6 10*3/uL — ABNORMAL HIGH (ref 1.7–7.7)
Neutrophils Relative %: 77 %
Platelets: 227 10*3/uL (ref 150–400)
RBC: 4.8 MIL/uL (ref 4.22–5.81)
RDW: 13.1 % (ref 11.5–15.5)
WBC: 12.3 10*3/uL — ABNORMAL HIGH (ref 4.0–10.5)
nRBC: 0 % (ref 0.0–0.2)

## 2023-03-02 LAB — LIPASE, BLOOD: Lipase: 26 U/L (ref 11–51)

## 2023-03-02 MED ORDER — IOHEXOL 300 MG/ML  SOLN
100.0000 mL | Freq: Once | INTRAMUSCULAR | Status: AC | PRN
  Administered 2023-03-02: 100 mL via INTRAVENOUS

## 2023-03-02 MED ORDER — HYDROCODONE-ACETAMINOPHEN 5-325 MG PO TABS: 1.0000 | ORAL_TABLET | ORAL | 0 refills | Status: DC | PRN

## 2023-03-02 MED ORDER — TAMSULOSIN HCL 0.4 MG PO CAPS: 0.4000 mg | ORAL_CAPSULE | Freq: Every day | ORAL | 0 refills | Status: DC

## 2023-03-02 MED ORDER — IBUPROFEN 400 MG PO TABS: 400.0000 mg | ORAL_TABLET | Freq: Four times a day (QID) | ORAL | 0 refills | Status: DC | PRN

## 2023-03-02 MED ORDER — ONDANSETRON HCL 4 MG/2ML IJ SOLN
4.0000 mg | Freq: Once | INTRAMUSCULAR | Status: AC
  Administered 2023-03-02: 4 mg via INTRAVENOUS
  Filled 2023-03-02: qty 2

## 2023-03-02 MED ORDER — KETOROLAC TROMETHAMINE 15 MG/ML IJ SOLN
15.0000 mg | Freq: Once | INTRAMUSCULAR | Status: AC
  Administered 2023-03-02: 15 mg via INTRAVENOUS
  Filled 2023-03-02: qty 1

## 2023-03-02 MED ORDER — MORPHINE SULFATE (PF) 4 MG/ML IV SOLN
4.0000 mg | Freq: Once | INTRAVENOUS | Status: AC
Start: 2023-03-02 — End: 2023-03-02
  Administered 2023-03-02: 4 mg via INTRAVENOUS
  Filled 2023-03-02: qty 1

## 2023-03-02 NOTE — ED Triage Notes (Addendum)
Patient reports upper and lower left sided abd pain. Nausea/vomiting. Bariatric patient. Reports starting Ozempic recently.

## 2023-03-02 NOTE — ED Provider Notes (Signed)
[place] EMERGENCY DEPARTMENT AT [facility] Provider Note   CSN: [id number] Arrival date & time: 03/02/23  0510     History  Chief Complaint  Patient presents with   Abdominal Pain    Jeffrey Skinner is a 67 y.o. male.  HPI     This is a 67 year old male with a history of morbid obesity and heart failure who presents with abdominal pain.  Patient reports 5-hour history of upper abdominal pain.  He states it feels similar to when he had gallbladder issues.  He reports some nausea.  Recently was started on a generic Ozempic but has been tolerating that well.  Reports normal bowel movements.  No fevers or urinary symptoms.  Home Medications Prior to Admission medications   Medication Sig Start Date End Date Taking? Authorizing Provider  albuterol (VENTOLIN HFA) 108 (90 Base) MCG/ACT inhaler Inhale 2 puffs into the lungs every 6 (six) hours as needed for wheezing or shortness of breath.    [provider]  empagliflozin (JARDIANCE) 10 MG TABS tablet Take 1 tablet (10 mg total) by mouth daily. Patient not taking: Reported on 03/12/2022 02/06/22   Robbie Lis M, PA-C  fluticasone furoate-vilanterol (BREO ELLIPTA) 200-25 MCG/INH AEPB Inhale 1 puff into the lungs daily. Patient not taking: Reported on 03/12/2022 01/02/21   Rai, Delene Ruffini, MD  furosemide (LASIX) 40 MG tablet Take 1 tablet (40 mg total) by mouth 2 (two) times daily. 02/06/22   Allayne Butcher, PA-C  Incontinence Supplies (BARD URINARY DRAINAGE BAG) MISC 1 application by Does not apply route in the morning and at bedtime. 04/11/21   Barbette Merino, NP  ipratropium-albuterol (DUONEB) 0.5-2.5 (3) MG/3ML SOLN Take 3 mLs by nebulization every 6 (six) hours as needed (shortness of breath). 01/02/22   Mesner, Barbara Cower, MD  metoprolol succinate (TOPROL XL) 25 MG 24 hr tablet Take 1 tablet (25 mg total) by mouth daily. 03/12/22   Nahser, Deloris Ping, MD  polyethylene glycol powder (GLYCOLAX/MIRALAX) 17 GM/SCOOP  powder Take 17 g by mouth daily as needed for moderate constipation. Patient not taking: Reported on 03/12/2022 01/29/22   Zannie Cove, MD  sildenafil (VIAGRA) 100 MG tablet Take 1 tablet (100 mg total) by mouth daily as needed for erectile dysfunction. Patient not taking: Reported on 03/12/2022 04/11/21   Barbette Merino, NP  spironolactone (ALDACTONE) 25 MG tablet Take 1 tablet (25 mg total) by mouth daily. 02/06/22   Allayne Butcher, PA-C      Allergies    Dog epithelium    Review of Systems   Review of Systems  Constitutional:  Negative for fever.  Gastrointestinal:  Positive for abdominal pain and nausea. Negative for constipation, diarrhea and vomiting.  All other systems reviewed and are negative.   Physical Exam Updated Vital Signs BP (!) 174/87 (BP Location: Right Arm)   Pulse 83   Resp 20   SpO2 100%  Physical Exam Vitals and nursing note reviewed.  Constitutional:      Appearance: He is well-developed.     Comments: Morbidly obese, nontoxic.  HENT:     Head: Normocephalic and atraumatic.  Eyes:     Pupils: Pupils are equal, round, and reactive to light.  Cardiovascular:     Rate and Rhythm: Normal rate and regular rhythm.     Heart sounds: Normal heart sounds. No murmur heard. Pulmonary:     Effort: Pulmonary effort is normal. No respiratory distress.     Breath sounds: Normal  breath sounds. No wheezing.  Abdominal:     General: Bowel sounds are normal.     Palpations: Abdomen is soft.     Tenderness: There is abdominal tenderness in the right upper quadrant and epigastric area. There is no guarding or rebound.  Musculoskeletal:     Cervical back: Neck supple.  Lymphadenopathy:     Cervical: No cervical adenopathy.  Skin:    General: Skin is warm and dry.  Neurological:     General: No focal deficit present.     Mental Status: He is alert and oriented to person, place, and time.     ED Results / Procedures / Treatments   Labs (all labs ordered are  listed, but only abnormal results are displayed) Labs Reviewed  CBC WITH DIFFERENTIAL/PLATELET  COMPREHENSIVE METABOLIC PANEL  LIPASE, BLOOD  URINALYSIS, ROUTINE W REFLEX MICROSCOPIC    EKG None  Radiology No results found.  Procedures Procedures    Medications Ordered in ED Medications  morphine (PF) 4 MG/ML injection 4 mg (has no administration in time range)  ondansetron (ZOFRAN) injection 4 mg (has no administration in time range)    ED Course/ Medical Decision Making/ A&P                             Medical Decision Making Amount and/or Complexity of Data Reviewed Labs: ordered. Radiology: ordered.  Risk Prescription drug management.   This patient presents to the ED for concern of abdominal pain, this involves an extensive number of treatment options, and is a complaint that carries with it a high risk of complications and morbidity.  I considered the following differential and admission for this acute, potentially life threatening condition.  The differential diagnosis includes gastritis, pancreatitis, cholecystitis, appendicitis, kidney stones  MDM:    This is a 67 year old male who presents with acute onset of abdominal pain.  He equates this to similar pain he had with gallstone pancreatitis.  He is overall nontoxic.  He appears tender on exam although exam is severely limited secondary to body habitus.  Labs ordered.  Patient was given pain and nausea medication.  He has a slight leukocytosis to 12.3.  CMP and lipase are pending.  Patient signed out pending lab work.  Anticipate he may need imaging to further differentiate. (Labs, imaging, consults)  Labs: I Ordered, and personally interpreted labs.  The pertinent results include: CBC  Imaging Studies ordered: I ordered imaging studies including none I independently visualized and interpreted imaging. I agree with the radiologist interpretation  Additional history obtained from chart review.  External  records from outside source obtained and reviewed including prior evaluations  Cardiac Monitoring: The patient was maintained on a cardiac monitor.  If on the cardiac monitor, I personally viewed and interpreted the cardiac monitored which showed an underlying rhythm of: Sinus rhythm  Reevaluation: After the interventions noted above, I reevaluated the patient and found that they have :stayed the same  Social Determinants of Health:  lives independently  Disposition: Pending  Co morbidities that complicate the patient evaluation  Past Medical History:  Diagnosis Date   Acute diastolic CHF (congestive heart failure) ([facility]) 06/14/2019   Anxiety 12/16/2019   Anxiety 11/2019   Arthritis    COPD (chronic obstructive pulmonary disease) ([facility]) 12/30/2020   Depression    Dyspnea    Hypertension    Insomnia 11/2029   Morbid obesity with BMI of 60.0-69.9, adult ([facility])  Sleep apnea    Vitamin D deficiency 06/2019     Medicines Meds ordered this encounter  Medications   morphine (PF) 4 MG/ML injection 4 mg   ondansetron (ZOFRAN) injection 4 mg   iohexol (OMNIPAQUE) 300 MG/ML solution 100 mL   ketorolac (TORADOL) 15 MG/ML injection 15 mg   HYDROcodone-acetaminophen (NORCO) 5-325 MG tablet    Sig: Take 1 tablet by mouth every 4 (four) hours as needed.    Dispense:  15 tablet    Refill:  0   ibuprofen (ADVIL) 400 MG tablet    Sig: Take 1 tablet (400 mg total) by mouth every 6 (six) hours as needed.    Dispense:  30 tablet    Refill:  0   tamsulosin (FLOMAX) 0.4 MG CAPS capsule    Sig: Take 1 capsule (0.4 mg total) by mouth daily.    Dispense:  14 capsule    Refill:  0    I have reviewed the patients home medicines and have made adjustments as needed  Problem List / ED Course: Problem List Items Addressed This Visit   None Visit Diagnoses     Left ureteral stone    -  Primary   Relevant Medications   morphine (PF) 4 MG/ML injection 4 mg (Completed)   HYDROcodone-acetaminophen  (NORCO) 5-325 MG tablet                   Final Clinical Impression(s) / ED Diagnoses Final diagnoses:  None    Rx / DC Orders ED Discharge Orders     None         Shon Baton, MD 03/02/23 2326

## 2023-03-02 NOTE — ED Provider Notes (Signed)
7:26 AM Patient is currently feeling better. He has no reproducible tenderness on my exam. Labs hemolyzed but no sign of of biliary abnormality or pancreatitis. Will get CT for further eval. He denies any acute chest pain or dyspnea.  9:09 AM CT shows left sided ureteral stone. Pain medicine is wearing off. Will give some toradol and assess urine. No UTI symptoms currently. Based on size will put on flomax as outpatient.   10:53 AM Patient feels a lot better after toradol. Feels well enough for discharge. Will give urology follow up and treat with pain control at home.    Pricilla Loveless, MD 03/02/23 1053

## 2023-03-02 NOTE — Discharge Instructions (Addendum)
Your CT scan and today shows a stone in your ureter coming from your kidney.  This is what is causing the left-sided pain.  You are being given different treatments to help with pain.  You may also take over-the-counter Tylenol.  Follow-up with the urologist, call their office tomorrow.  If you develop fever, vomiting, urinary tract infection symptoms such as burning when you urinate, and actable pain, or any other new/concerning symptoms and return to the ER or call 911.

## 2023-03-02 NOTE — ED Notes (Signed)
Patient has a urine culture in the main lab 

## 2023-03-03 ENCOUNTER — Telehealth: Payer: Self-pay

## 2023-03-03 NOTE — Transitions of Care (Post Inpatient/ED Visit) (Cosign Needed)
03/03/2023  Name: Jeffrey Skinner MRN: [id number] DOB: Apr 02, 1956  Today's TOC FU Call Status: Today's TOC FU Call Status:: Successful TOC FU Call Competed TOC FU Call Complete Date: 03/03/23  Transition Care Management Follow-up Telephone Call Date of Discharge: 03/02/23 Discharge Facility: [facility]) Type of Discharge: Emergency Department Reason for ED Visit: Other: (abd pain) How have you been since you were released from the hospital?: Better Any questions or concerns?: No  Items Reviewed: Did you receive and understand the discharge instructions provided?: Yes Medications obtained,verified, and reconciled?: Yes (Medications Reviewed) Any new allergies since your discharge?: No Dietary orders reviewed?: NA Do you have support at home?: No  Medications Reviewed Today: Medications Reviewed Today     Reviewed by Renelda Loma, RMA (Registered Medical Assistant) on 03/03/23 at 1110  Med List Status: <None>   Medication Order Taking? Sig Documenting Provider Last Dose Status Informant  albuterol (VENTOLIN HFA) 108 (90 Base) MCG/ACT inhaler [id number]  Inhale 2 puffs into the lungs every 6 (six) hours as needed for wheezing or shortness of breath. [provider]  Active Self, Pharmacy Records  empagliflozin (JARDIANCE) 10 MG TABS tablet [id number] No Take 1 tablet (10 mg total) by mouth daily.  Patient not taking: Reported on 03/12/2022   Allayne Butcher, PA-C Not Taking Active Self, Pharmacy Records  fluticasone furoate-vilanterol (BREO ELLIPTA) 200-25 MCG/INH AEPB [id number] No Inhale 1 puff into the lungs daily.  Patient not taking: Reported on 03/12/2022   Cathren Harsh, MD Not Taking Active Self, Pharmacy Records           Med Note Daphine Deutscher, [place]   Tue Mar 12, 2022  2:56 PM)    furosemide (LASIX) 40 MG tablet [id number] Yes Take 1 tablet (40 mg total) by mouth 2 (two) times daily. Allayne Butcher, PA-C Taking Active Self, Pharmacy Records   HYDROcodone-acetaminophen [facility] 5-325 MG tablet [id number] Yes Take 1 tablet by mouth every 4 (four) hours as needed. Pricilla Loveless, MD Taking Active   ibuprofen (ADVIL) 400 MG tablet [id number] Yes Take 1 tablet (400 mg total) by mouth every 6 (six) hours as needed. Pricilla Loveless, MD Taking Active   Incontinence Supplies [facility] URINARY DRAINAGE BAG) MISC [id number] No 1 application by Does not apply route in the morning and at bedtime.  Patient not taking: Reported on 03/02/2023   Barbette Merino, NP Not Taking Active Self, Pharmacy Records  ipratropium-albuterol (DUONEB) 0.5-2.5 (3) MG/3ML SOLN [id number] Yes Take 3 mLs by nebulization every 6 (six) hours as needed (shortness of breath). Mesner, Barbara Cower, MD Taking Active Self, Pharmacy Records  metoprolol succinate (TOPROL XL) 25 MG 24 hr tablet [id number] No Take 1 tablet (25 mg total) by mouth daily.  Patient not taking: Reported on 03/02/2023   Nahser, Deloris Ping, MD Not Taking Active Self, Pharmacy Records  polyethylene glycol powder [facility]) 17 GM/SCOOP powder [id number] No Take 17 g by mouth daily as needed for moderate constipation.  Patient not taking: Reported on 03/12/2022   Zannie Cove, MD Not Taking Active Self, Pharmacy Records  sildenafil (VIAGRA) 100 MG tablet [id number] No Take 1 tablet (100 mg total) by mouth daily as needed for erectile dysfunction.  Patient not taking: Reported on 03/12/2022   Barbette Merino, NP Not Taking Active Self, Pharmacy Records           Med Note Sheppard Coil   Tue Mar 12, 2022  2:56 PM)    spironolactone (ALDACTONE) 25 MG tablet [id number] Yes  Take 1 tablet (25 mg total) by mouth daily. Allayne Butcher, PA-C Taking Active Self, Pharmacy Records  tamsulosin [facility]) 0.4 MG CAPS capsule [id number] Yes Take 1 capsule (0.4 mg total) by mouth daily. Pricilla Loveless, MD Taking Active   tirzepatide [facility]) 5 MG/0.5ML Pen [id number] Yes Inject 5 mg into the skin once a week. [provider] Taking Active Self, Pharmacy Records            Home Care and Equipment/Supplies: Were Home Health Services Ordered?: NA Any new equipment or medical supplies ordered?: NA  Functional Questionnaire: Do you need assistance with bathing/showering or dressing?: No Do you need assistance with meal preparation?: Yes Do you need assistance with eating?: No Do you have difficulty maintaining continence: No Do you need assistance with getting out of bed/getting out of a chair/moving?: No Do you have difficulty managing or taking your medications?: No  Follow up appointments reviewed: PCP Follow-up appointment confirmed?: NA Specialist Hospital Follow-up appointment confirmed?: NA Do you need transportation to your follow-up appointment?: No Do you understand care options if your condition(s) worsen?: Yes-patient verbalized understanding    SIGNATURE Renelda Loma RMA

## 2024-03-19 ENCOUNTER — Telehealth: Payer: Self-pay | Admitting: Cardiovascular Disease

## 2024-03-19 ENCOUNTER — Other Ambulatory Visit (HOSPITAL_COMMUNITY): Payer: Self-pay | Admitting: Urology

## 2024-03-19 ENCOUNTER — Other Ambulatory Visit: Payer: Self-pay | Admitting: Urology

## 2024-03-19 DIAGNOSIS — R972 Elevated prostate specific antigen [PSA]: Secondary | ICD-10-CM

## 2024-03-19 NOTE — Telephone Encounter (Signed)
   Pre-operative Risk Assessment    Patient Name: Jeffrey Skinner  DOB: Nov 30, 1955 MRN: [id number]      Request for Surgical Clearance    Procedure:  Prostate US  and Biopsy  Date of Surgery:  Clearance 04/13/24                                 Surgeon:  Dr. Valencia Gary Group or Practice Name:  [facility] Phone number:  [phone] Fax number:  [phone]   Type of Clearance Requested:   - Medical    Type of Anesthesia:  MAC   Additional requests/questions:    SignedBerniece Brisk   03/19/2024, 2:25 PM

## 2024-03-19 NOTE — Telephone Encounter (Signed)
   Name: Jeffrey Skinner  DOB: [birth year]  MRN: [id number]  Primary Cardiologist: None  Chart reviewed as part of pre-operative protocol coverage. Because of Jeffrey Skinner's past medical history and time since last visit, he will require a follow-up in-office visit in order to better assess preoperative cardiovascular risk. Last seen on 03/12/2022  Pre-op covering staff: - Please schedule appointment and call patient to inform them. If patient already had an upcoming appointment within acceptable timeframe, please add pre-op clearance to the appointment notes so provider is aware. - Please contact requesting surgeon's office via preferred method (i.e, phone, fax) to inform them of need for appointment prior to surgery.  Friddie Jetty, NP  03/19/2024, 2:39 PM

## 2024-03-19 NOTE — Telephone Encounter (Signed)
 1st attempt to call pt to schedule IN OFFICE preop appt.  Unable to leave message. After ringing it just stats that call is unable to be completed and to try call again later.

## 2024-03-22 NOTE — Telephone Encounter (Signed)
 We tried to reach the pt to x 2 to schedule an appt in the office for preop clearance. I was unable to leave message on pt's #. I called DPR pt's wife and left message for the pt to call the office to schedule in office preop appt.   I will also send FYI to requesting office pt needs in office appt as last seen 2023.

## 2024-03-23 NOTE — Telephone Encounter (Signed)
 3rd attempt to reach pt to schedule in office appt for preop clearance. We have been unable to reach the pt to schedule appt.   I will update the requesting office the pt needs to call to schedule appt in the office for preop clearance.   I will remove from preop call back at this time. When pt calls back they can let the operator now that he needs in office appt for preop clearance.

## 2024-03-30 ENCOUNTER — Encounter (HOSPITAL_COMMUNITY): Payer: Self-pay

## 2024-03-30 NOTE — Patient Instructions (Addendum)
 SURGICAL WAITING ROOM VISITATION Patients having surgery or a procedure may have no more than 2 support people in the waiting area - these visitors may rotate.    Children under the age of 66 must have an adult with them who is not the patient.  If the patient needs to stay at the hospital during part of their recovery, the visitor guidelines for inpatient rooms apply. Pre-op nurse will coordinate an appropriate time for 1 support person to accompany patient in pre-op.  This support person may not rotate.    Please refer to the [facility] website for the visitor guidelines for Inpatients (after your surgery is over and you are in a regular room).       Your procedure is scheduled on: 04-13-24   Report to [facility] Main Entrance    Report to admitting at 6:15 AM   Call this number if you have problems the morning of surgery [phone]   Do not eat food or drink liquids :After Midnight.          If you have questions, please contact your surgeon's office.  FOLLOW BOWEL PREP AND ANY ADDITIONAL PRE OP INSTRUCTIONS YOU RECEIVED FROM YOUR SURGEON'S OFFICE!!!     Oral Hygiene is also important to reduce your risk of infection.                                    Remember - BRUSH YOUR TEETH THE MORNING OF SURGERY WITH YOUR REGULAR TOOTHPASTE   Do NOT smoke after Midnight   Take these medicines the morning of surgery with A SIP OF WATER:    Tamsulosin    Hydrocodone  if needed   Okay to use inhalers  Stop all vitamins and herbal supplements 7 days before surgery  Hold Mounjaro 7 days before surgery (do not take after 04-05-24)  Bring CPAP mask and tubing day of surgery.                              You may not have any metal on your body including  jewelry, and body piercing             Do not wear  lotions, powders, cologne, or deodorant              Men may shave face and neck.   Do not bring valuables to the hospital. [place] IS NOT RESPONSIBLE   FOR  VALUABLES.   Contacts, dentures or bridgework may not be worn into surgery.  DO NOT BRING YOUR HOME MEDICATIONS TO THE HOSPITAL. PHARMACY WILL DISPENSE MEDICATIONS LISTED ON YOUR MEDICATION LIST TO YOU DURING YOUR ADMISSION IN THE HOSPITAL!    Patients discharged on the day of surgery will not be allowed to drive home.  Someone NEEDS to stay with you for the first 24 hours after anesthesia.   Special Instructions: Bring a copy of your healthcare power of attorney and living will documents the day of surgery if you haven't scanned them before.              Please read over the following fact sheets you were given: IF YOU HAVE QUESTIONS ABOUT YOUR PRE-OP INSTRUCTIONS PLEASE CALL [phone] [facility]  If you received a COVID test during your pre-op visit  it is requested that you wear a mask when out in public, stay away  from anyone that may not be feeling well and notify your surgeon if you develop symptoms. If you test positive for Covid or have been in contact with anyone that has tested positive in the last 10 days please notify you surgeon.   - Preparing for Surgery Before surgery, you can play an important role.  Because skin is not sterile, your skin needs to be as free of germs as possible.  You can reduce the number of germs on your skin by washing with CHG (chlorahexidine gluconate) soap before surgery.  CHG is an antiseptic cleaner which kills germs and bonds with the skin to continue killing germs even after washing. Please DO NOT use if you have an allergy to CHG or antibacterial soaps.  If your skin becomes reddened/irritated stop using the CHG and inform your nurse when you arrive at Short Stay. Do not shave (including legs and underarms) for at least 48 hours prior to the first CHG shower.  You may shave your face/neck.  Please follow these instructions carefully:  1.  Shower with CHG Soap the night before surgery and the  morning of surgery.  2.  If you choose to wash your  hair, wash your hair first as usual with your normal  shampoo.  3.  After you shampoo, rinse your hair and body thoroughly to remove the shampoo.                             4.  Use CHG as you would any other liquid soap.  You can apply chg directly to the skin and wash.  Gently with a scrungie or clean washcloth.  5.  Apply the CHG Soap to your body ONLY FROM THE NECK DOWN.   Do   not use on face/ open                           Wound or open sores. Avoid contact with eyes, ears mouth and   genitals (private parts).                       Wash face,  Genitals (private parts) with your normal soap.             6.  Wash thoroughly, paying special attention to the area where your    surgery  will be performed.  7.  Thoroughly rinse your body with warm water from the neck down.  8.  DO NOT shower/wash with your normal soap after using and rinsing off the CHG Soap.                9.  Pat yourself dry with a clean towel.            10.  Wear clean pajamas.            11.  Place clean sheets on your bed the night of your first shower and do not  sleep with pets. Day of Surgery : Do not apply any lotions/deodorants the morning of surgery.  Please wear clean clothes to the hospital/surgery center.  FAILURE TO FOLLOW THESE INSTRUCTIONS MAY RESULT IN THE CANCELLATION OF YOUR SURGERY  PATIENT SIGNATURE_________________________________  NURSE SIGNATURE__________________________________  ________________________________________________________________________

## 2024-03-30 NOTE — Progress Notes (Addendum)
  Date of COVID positive in last 90 days:  PCP -  Cardiologist - [facility]/Philip Nahser, MD  Chest x-ray -  EKG - 10-15-23 at [facility] Stress Test -  ECHO - 01-23-22 Epic Cardiac Cath -  Pacemaker/ICD device last checked: Spinal Cord Stimulator:  Bowel Prep -   Sleep Study - Yes, +sleep apnea CPAP -   Fasting Blood Sugar -  Checks Blood Sugar _____ times a day  Last dose of GLP1 agonist-  N/A GLP1 instructions:  Do not take after     Last dose of SGLT-2 inhibitors-  N/A SGLT-2 instructions:  Do not take after    Blood Thinner Instructions:  Last dose:   Time: Aspirin Instructions: Last Dose:  Activity level:  Can go up a flight of stairs and perform activities of daily living without stopping and without symptoms of chest pain or shortness of breath.  Able to exercise without symptoms  Unable to go up a flight of stairs without symptoms of     Anesthesia review:  CHF, COPD, mild aortic dilation 37 mm  Patient denies shortness of breath, fever, cough and chest pain at PAT appointment  Patient verbalized understanding of instructions that were given to them at the PAT appointment. Patient was also instructed that they will need to review over the PAT instructions again at home before surgery.

## 2024-03-31 ENCOUNTER — Encounter (HOSPITAL_COMMUNITY)
Admission: RE | Admit: 2024-03-31 | Discharge: 2024-03-31 | Disposition: A | Discharge status: Home / Self Care | Source: Ambulatory Visit | Attending: Anesthesiology | Admitting: Anesthesiology

## 2024-03-31 DIAGNOSIS — I10 Essential (primary) hypertension: Secondary | ICD-10-CM

## 2024-03-31 DIAGNOSIS — D649 Anemia, unspecified: Secondary | ICD-10-CM

## 2024-03-31 HISTORY — DX: Anemia, unspecified: D64.9

## 2024-04-04 IMAGING — DX DG CHEST 1V PORT
2 series · 2 of 2 positions shown · non-contrast
Comparison: 01/01/2022

CLINICAL DATA: Short of breath.  Altered mental status

EXAM:
PORTABLE CHEST 1 VIEW

[chest ap (1 of 2)]
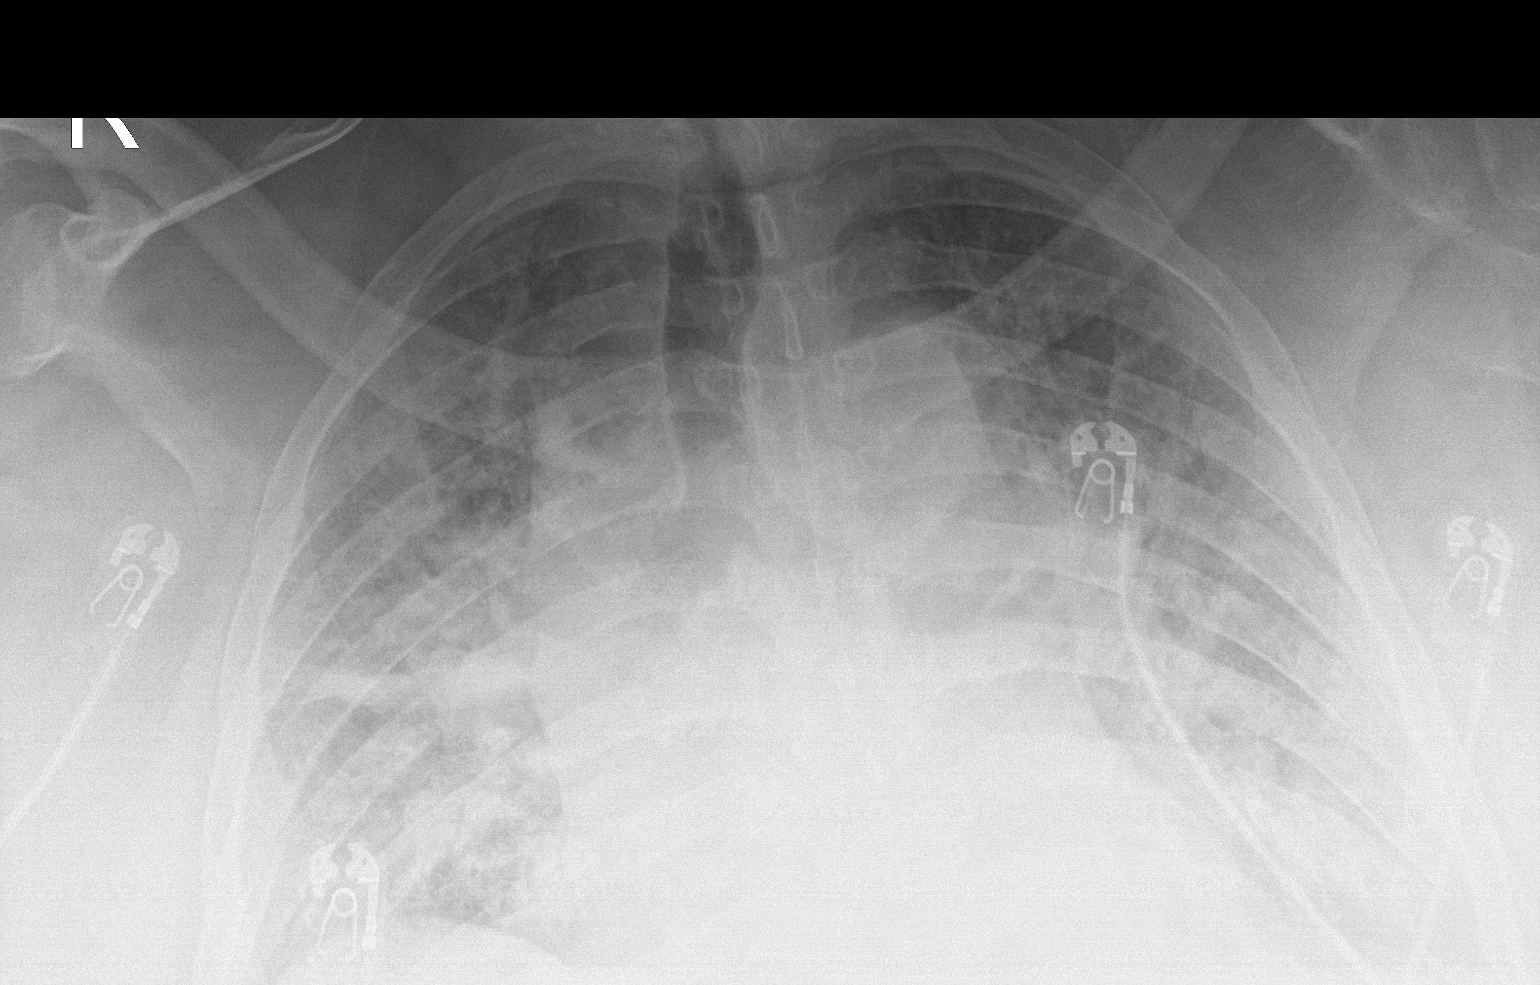

[chest ap (2 of 2)]
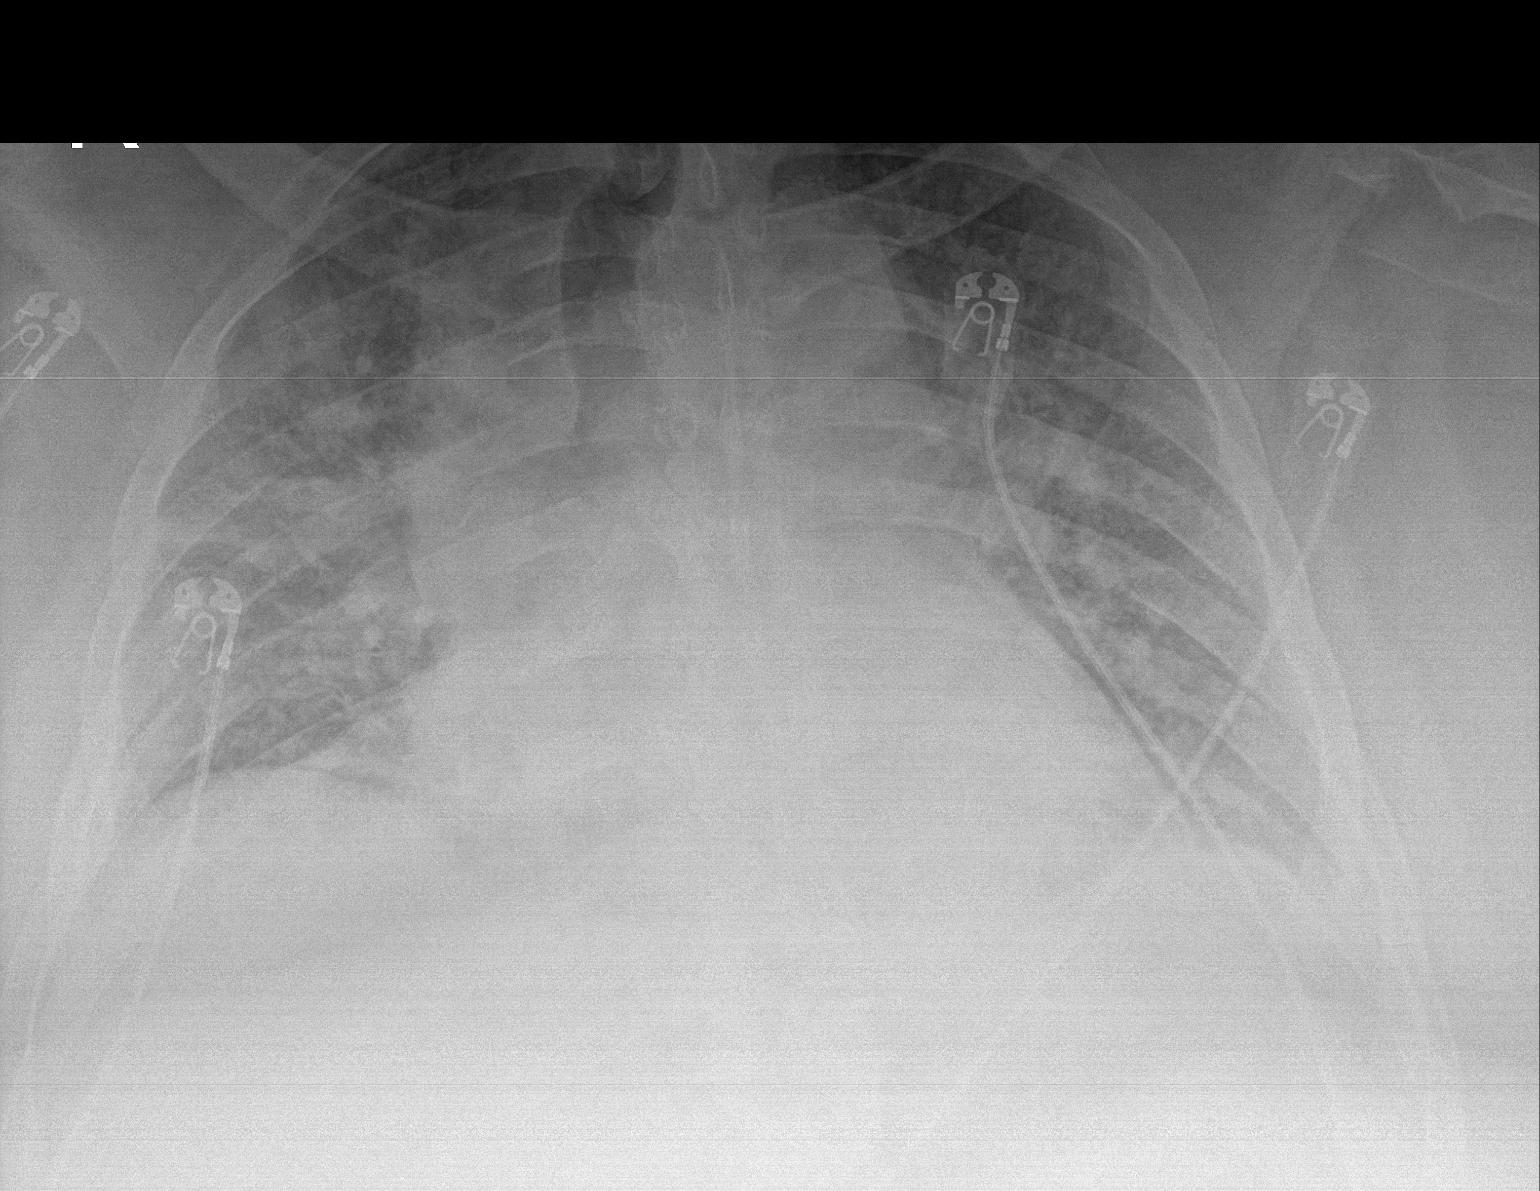

[2 of 2 positions shown; findings below may reference images not displayed]

FINDINGS: To low volume portable radiographs, limited by patient body habitus.

Numerous leads and wires project over the chest. Patient rotated
right. Mild cardiomegaly. No pleural effusion or pneumothorax.
Development of diffuse interstitial and airspace disease
bilaterally.
IMPRESSION: Mildly limited portable radiograph, as detailed above.

Development of diffuse interstitial and airspace disease. Pulmonary
edema favored over multifocal infection.

Cardiomegaly.

## 2024-04-05 IMAGING — DX DG CHEST 1V PORT
1 series · 1 of 1 positions shown · non-contrast
Comparison: Chest radiograph one day prior

CLINICAL DATA: Pulmonary edema

EXAM:
PORTABLE CHEST 1 VIEW

[chest]
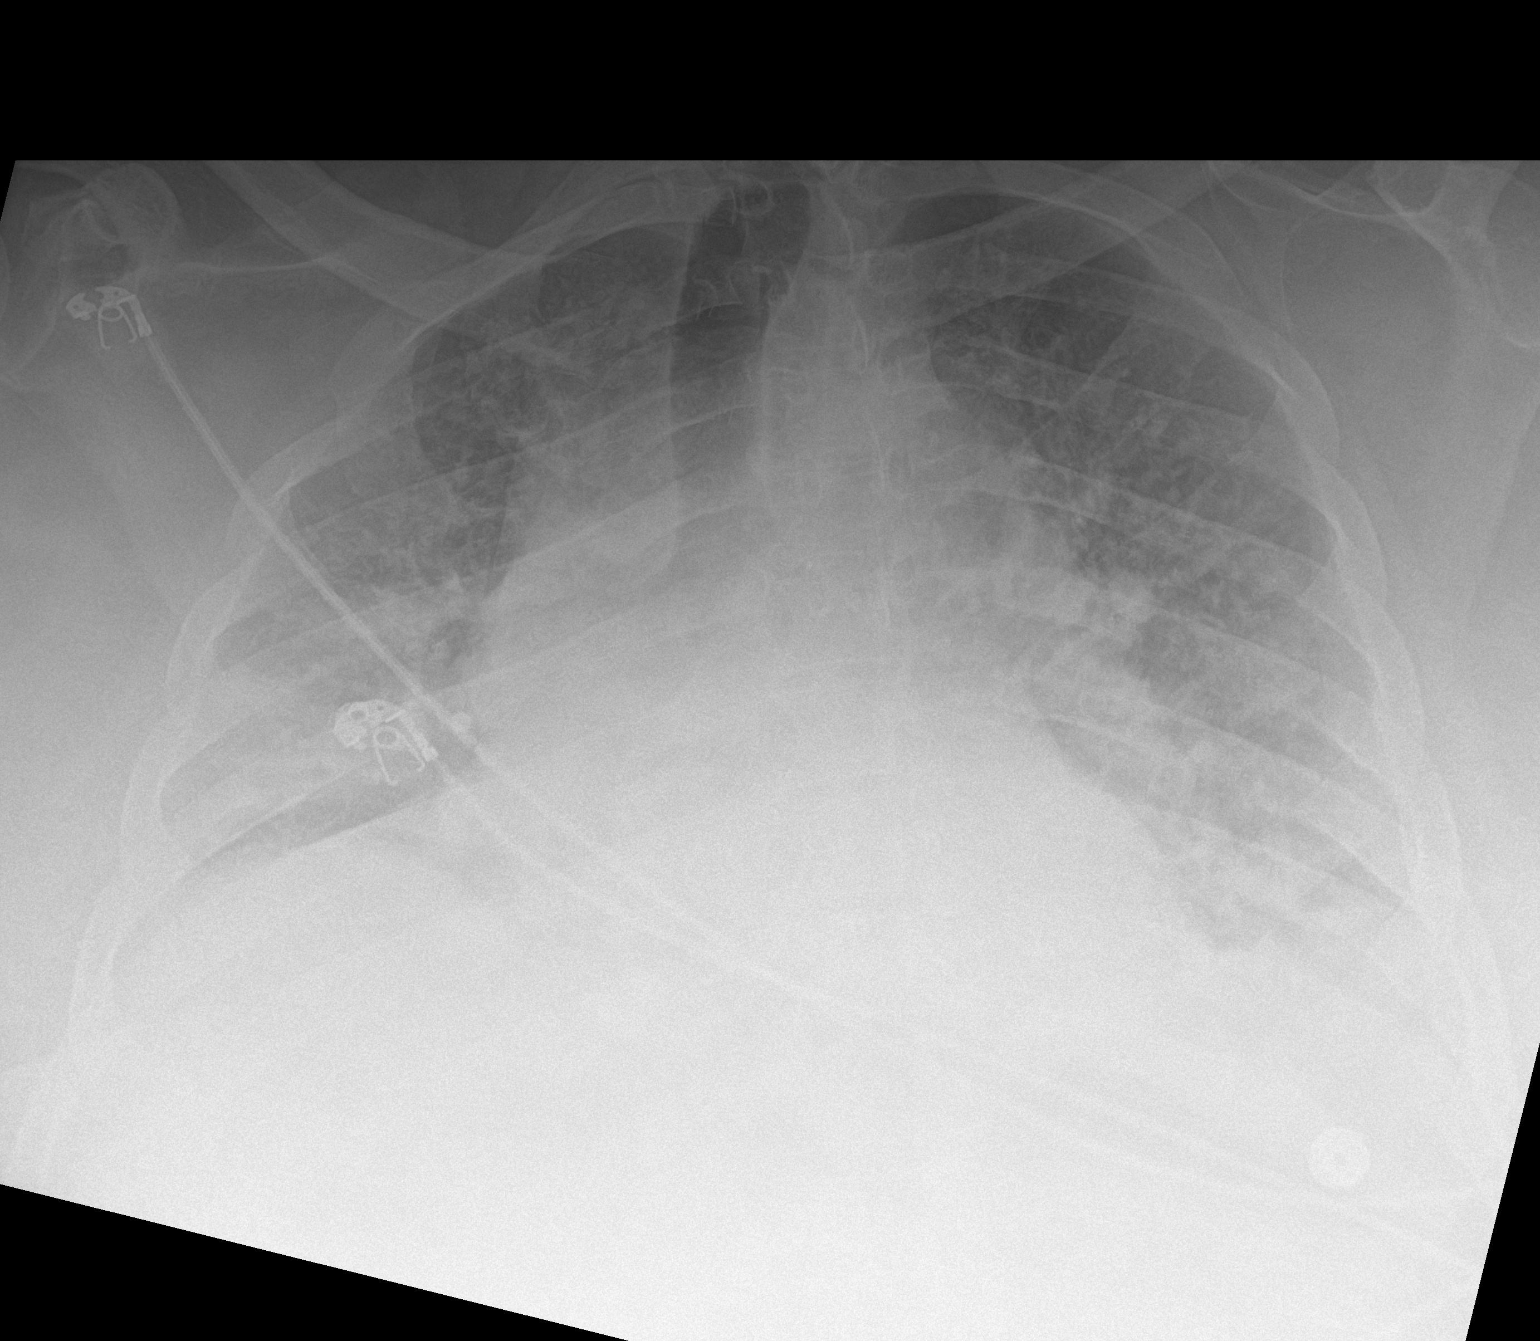

[1 of 1 positions shown; findings below may reference images not displayed]

FINDINGS: The heart and mediastinal contours are enlarged, unchanged.

There is vascular congestion with increased interstitial markings
bilaterally consistent with pulmonary edema, stable to minimally
improved compared to the study from 1 day prior. There is no new or
worsening focal airspace disease. There is no large pleural
effusion. There is no pneumothorax

There is no acute osseous abnormality.
IMPRESSION: Increased interstitial markings bilaterally likely reflecting
pulmonary edema, stable to slightly improved compared to the study
from 1 day prior. No new or worsening focal airspace disease. No
large pleural effusion.

## 2024-04-05 NOTE — Patient Instructions (Signed)
 SURGICAL WAITING ROOM VISITATION  Patients having surgery or a procedure may have no more than 2 support people in the waiting area - these visitors may rotate.    Children under the age of 35 must have an adult with them who is not the patient.  Visitors with respiratory illnesses are discouraged from visiting and should remain at home.  If the patient needs to stay at the hospital during part of their recovery, the visitor guidelines for inpatient rooms apply. Pre-op nurse will coordinate an appropriate time for 1 support person to accompany patient in pre-op.  This support person may not rotate.    Please refer to the [facility] website for the visitor guidelines for Inpatients (after your surgery is over and you are in a regular room).       Your procedure is scheduled on: 04/13/24   Report to [facility] Main Entrance    Report to admitting at 6:15 AM   Call this number if you have problems the morning of surgery [phone]   Do not eat food or drink liquids:After Midnight.but may have sips of water with meds     FOLLOW BOWEL PREP AND ANY ADDITIONAL PRE OP INSTRUCTIONS YOU RECEIVED FROM YOUR SURGEON'S OFFICE!!!     Oral Hygiene is also important to reduce your risk of infection.                                    Remember - BRUSH YOUR TEETH THE MORNING OF SURGERY WITH YOUR REGULAR TOOTHPASTE  DENTURES WILL BE REMOVED PRIOR TO SURGERY PLEASE DO NOT APPLY Poly grip OR ADHESIVES!!!   Do NOT smoke after Midnight   Stop all vitamins and herbal supplements 7 days before surgery.   Take these medicines the morning of surgery with A SIP OF WATER: Hydrocodone , tamsulosin                Hold Mounjaro for at least 7 days prior to surgery.             You may not have any metal on your body including hair pins, jewelry, and body piercing             Do not wear , lotions, powders, cologne, or deodorant              Men may shave face and neck.   Do not bring  valuables to the hospital. [facility] IS NOT             RESPONSIBLE   FOR VALUABLES.   Contacts, glasses, dentures or bridgework may not be worn into surgery.  DO NOT BRING YOUR HOME MEDICATIONS TO THE HOSPITAL. PHARMACY WILL DISPENSE MEDICATIONS LISTED ON YOUR MEDICATION LIST TO YOU DURING YOUR ADMISSION IN THE HOSPITAL!    Patients discharged on the day of surgery will not be allowed to drive home.  Someone NEEDS to stay with you for the first 24 hours after anesthesia.   Special Instructions: Bring a copy of your healthcare power of attorney and living will documents the day of surgery if you haven't scanned them before.              Please read over the following fact sheets you were given: IF YOU HAVE QUESTIONS ABOUT YOUR PRE-OP INSTRUCTIONS PLEASE CALL [phone]   If you received a COVID test during your pre-op visit  it is requested that  you wear a mask when out in public, stay away from anyone that may not be feeling well and notify your surgeon if you develop symptoms. If you test positive for Covid or have been in contact with anyone that has tested positive in the last 10 days please notify you surgeon.    [facility] - Preparing for Surgery Before surgery, you can play an important role.  Because skin is not sterile, your skin needs to be as free of germs as possible.  You can reduce the number of germs on your skin by washing with CHG (chlorahexidine gluconate) soap before surgery.  CHG is an antiseptic cleaner which kills germs and bonds with the skin to continue killing germs even after washing. Please DO NOT use if you have an allergy to CHG or antibacterial soaps.  If your skin becomes reddened/irritated stop using the CHG and inform your nurse when you arrive at Short Stay. Do not shave (including legs and underarms) for at least 48 hours prior to the first CHG shower.  You may shave your face/neck.  Please follow these instructions carefully:  1.  Shower with CHG Soap  the night before surgery and the  morning of surgery.  2.  If you choose to wash your hair, wash your hair first as usual with your normal  shampoo.  3.  After you shampoo, rinse your hair and body thoroughly to remove the shampoo.                             4.  Use CHG as you would any other liquid soap.  You can apply chg directly to the skin and wash.  Gently with a scrungie or clean washcloth.  5.  Apply the CHG Soap to your body ONLY FROM THE NECK DOWN.   Do   not use on face/ open                           Wound or open sores. Avoid contact with eyes, ears mouth and   genitals (private parts).                       Wash face,  Genitals (private parts) with your normal soap.             6.  Wash thoroughly, paying special attention to the area where your    surgery  will be performed.  7.  Thoroughly rinse your body with warm water from the neck down.  8.  DO NOT shower/wash with your normal soap after using and rinsing off the CHG Soap.                9.  Pat yourself dry with a clean towel.            10.  Wear clean pajamas.            11.  Place clean sheets on your bed the night of your first shower and do not  sleep with pets. Day of Surgery : Do not apply any lotions/deodorants the morning of surgery.  Please wear clean clothes to the hospital/surgery center.  FAILURE TO FOLLOW THESE INSTRUCTIONS MAY RESULT IN THE CANCELLATION OF YOUR SURGERY  PATIENT SIGNATURE_________________________________  NURSE SIGNATURE__________________________________  ________________________________________________________________________

## 2024-04-06 ENCOUNTER — Encounter (HOSPITAL_COMMUNITY)
Admission: RE | Admit: 2024-04-06 | Discharge: 2024-04-06 | Disposition: A | Discharge status: Home / Self Care | Source: Ambulatory Visit

## 2024-04-08 ENCOUNTER — Encounter (HOSPITAL_COMMUNITY)
Admission: RE | Admit: 2024-04-08 | Discharge: 2024-04-08 | Disposition: A | Discharge status: Home / Self Care | Source: Ambulatory Visit | Attending: Urology | Admitting: Urology

## 2024-04-08 ENCOUNTER — Encounter (HOSPITAL_COMMUNITY): Payer: Self-pay

## 2024-04-08 ENCOUNTER — Other Ambulatory Visit: Payer: Self-pay

## 2024-04-08 DIAGNOSIS — J449 Chronic obstructive pulmonary disease, unspecified: Secondary | ICD-10-CM | POA: Insufficient documentation

## 2024-04-08 DIAGNOSIS — I5032 Chronic diastolic (congestive) heart failure: Secondary | ICD-10-CM | POA: Diagnosis not present

## 2024-04-08 DIAGNOSIS — R972 Elevated prostate specific antigen [PSA]: Secondary | ICD-10-CM | POA: Diagnosis not present

## 2024-04-08 DIAGNOSIS — I110 Hypertensive heart disease with heart failure: Secondary | ICD-10-CM | POA: Insufficient documentation

## 2024-04-08 DIAGNOSIS — Z01812 Encounter for preprocedural laboratory examination: Secondary | ICD-10-CM | POA: Insufficient documentation

## 2024-04-08 DIAGNOSIS — D649 Anemia, unspecified: Secondary | ICD-10-CM

## 2024-04-08 DIAGNOSIS — G4730 Sleep apnea, unspecified: Secondary | ICD-10-CM | POA: Insufficient documentation

## 2024-04-08 DIAGNOSIS — I10 Essential (primary) hypertension: Secondary | ICD-10-CM

## 2024-04-08 HISTORY — DX: Personal history of urinary calculi: Z87.442

## 2024-04-08 LAB — CBC
HCT: 42.6 % (ref 39.0–52.0)
Hemoglobin: 12.9 g/dL — ABNORMAL LOW (ref 13.0–17.0)
MCH: 28.9 pg (ref 26.0–34.0)
MCHC: 30.3 g/dL (ref 30.0–36.0)
MCV: 95.3 fL (ref 80.0–100.0)
Platelets: 263 K/uL (ref 150–400)
RBC: 4.47 MIL/uL (ref 4.22–5.81)
RDW: 13.7 % (ref 11.5–15.5)
WBC: 11.2 K/uL — ABNORMAL HIGH (ref 4.0–10.5)
nRBC: 0 % (ref 0.0–0.2)

## 2024-04-08 LAB — BASIC METABOLIC PANEL WITH GFR
Anion gap: 11 (ref 5–15)
BUN: 7 mg/dL — ABNORMAL LOW (ref 8–23)
CO2: 26 mmol/L (ref 22–32)
Calcium: 8.8 mg/dL — ABNORMAL LOW (ref 8.9–10.3)
Chloride: 102 mmol/L (ref 98–111)
Creatinine, Ser: 0.95 mg/dL (ref 0.61–1.24)
GFR, Estimated: >60 mL/min (ref >60)
Glucose, Bld: 143 mg/dL — ABNORMAL HIGH (ref 70–99)
Potassium: 3.6 mmol/L (ref 3.5–5.1)
Sodium: 139 mmol/L (ref 135–145)

## 2024-04-08 NOTE — Progress Notes (Addendum)
  Date of COVID positive in last 105 days:no  [facility] Cardiologist - [facility]/Philip Nahser, MD  Chest x-ray -  EKG - 10-15-23 at the [facility] Stress Test -  ECHO - 01-23-22 Epic Cardiac Cath -  Request sent to [facility] for records 04/08/24  Pacemaker/ICD device last checked:no Spinal Cord Stimulator:no  Bowel Prep -   Sleep Study - Yes, +sleep apnea CPAP - compliant  Fasting Blood Sugar - n/a Checks Blood Sugar ____0_ times a day  Last dose of GLP1 agonist- Mounjaro- last dose 04/05/24 Instructed to hold until after surgery. GLP1 instructions:  Do not take after     Last dose of SGLT-2 inhibitors-  N/A SGLT-2 instructions:  Do not take after    Blood Thinner Instructions:  Last dose: n/a  Time: Aspirin Instructions:n/a Last Dose:  Able to exercise without symptoms  Unable to go up a flight of stairs without symptoms of Knee and hip dysfunction    Anesthesia review:  CHF, COPD, mild aortic dilation 37 mm, HTN, OSA  Patient denies  fever, cough and chest pain at PAT appointment  Patient verbalized understanding of instructions that were given to them at the PAT appointment. Patient was also instructed that they will need to review over the PAT instructions again at home before surgery.

## 2024-04-08 NOTE — Patient Instructions (Signed)
 SURGICAL WAITING ROOM VISITATION  Patients having surgery or a procedure may have no more than 2 support people in the waiting area - these visitors may rotate.    Children under the age of 64 must have an adult with them who is not the patient.  Visitors with respiratory illnesses are discouraged from visiting and should remain at home.  If the patient needs to stay at the hospital during part of their recovery, the visitor guidelines for inpatient rooms apply. Pre-op nurse will coordinate an appropriate time for 1 support person to accompany patient in pre-op.  This support person may not rotate.    Please refer to the [facility] website for the visitor guidelines for Inpatients (after your surgery is over and you are in a regular room).       Your procedure is scheduled on: 04/13/24   Report to [facility] Main Entrance    Report to admitting at 6:15 AM   Call this number if you have problems the morning of surgery [phone]   Do not eat food or drink liquids:After Midnight. But can have sips of water with meds      FOLLOW BOWEL PREP AND ANY ADDITIONAL PRE OP INSTRUCTIONS YOU RECEIVED FROM YOUR SURGEON'S OFFICE!!!     Oral Hygiene is also important to reduce your risk of infection.                                    Remember - BRUSH YOUR TEETH THE MORNING OF SURGERY WITH YOUR REGULAR TOOTHPASTE   Stop all vitamins and herbal supplements 7 days before surgery.   Take these medicines the morning of surgery with A SIP OF WATER: Hydrocodone , Tamsulosin   Bring CPAP mask and tubing day of surgery.                              You may not have any metal on your body including hair pins, jewelry, and body piercing             Do not wear make-up, lotions, powders, perfumes/cologne, or deodorant              Men may shave face and neck.   Do not bring valuables to the hospital. [facility] IS NOT             RESPONSIBLE   FOR VALUABLES.   Contacts, glasses,  dentures or bridgework may not be worn into surgery.  DO NOT BRING YOUR HOME MEDICATIONS TO THE HOSPITAL. PHARMACY WILL DISPENSE MEDICATIONS LISTED ON YOUR MEDICATION LIST TO YOU DURING YOUR ADMISSION IN THE HOSPITAL!    Patients discharged on the day of surgery will not be allowed to drive home.  Someone NEEDS to stay with you for the first 24 hours after anesthesia.   Special Instructions: Bring a copy of your healthcare power of attorney and living will documents the day of surgery if you haven't scanned them before.              Please read over the following fact sheets you were given: IF YOU HAVE QUESTIONS ABOUT YOUR PRE-OP INSTRUCTIONS PLEASE CALL [phone] [place]   If you received a COVID test during your pre-op visit  it is requested that you wear a mask when out in public, stay away from anyone that may not be feeling well  and notify your surgeon if you develop symptoms. If you test positive for Covid or have been in contact with anyone that has tested positive in the last 10 days please notify you surgeon.    [facility] - Preparing for Surgery Before surgery, you can play an important role.  Because skin is not sterile, your skin needs to be as free of germs as possible.  You can reduce the number of germs on your skin by washing with CHG (chlorahexidine gluconate) soap before surgery.  CHG is an antiseptic cleaner which kills germs and bonds with the skin to continue killing germs even after washing. Please DO NOT use if you have an allergy to CHG or antibacterial soaps.  If your skin becomes reddened/irritated stop using the CHG and inform your nurse when you arrive at Short Stay. Do not shave (including legs and underarms) for at least 48 hours prior to the first CHG shower.  You may shave your face/neck.  Please follow these instructions carefully:  1.  Shower with CHG Soap the night before surgery and the  morning of surgery.  2.  If you choose to wash your hair, wash your  hair first as usual with your normal  shampoo.  3.  After you shampoo, rinse your hair and body thoroughly to remove the shampoo.                             4.  Use CHG as you would any other liquid soap.  You can apply chg directly to the skin and wash.  Gently with a scrungie or clean washcloth.  5.  Apply the CHG Soap to your body ONLY FROM THE NECK DOWN.   Do   not use on face/ open                           Wound or open sores. Avoid contact with eyes, ears mouth and   genitals (private parts).                       Wash face,  Genitals (private parts) with your normal soap.             6.  Wash thoroughly, paying special attention to the area where your    surgery  will be performed.  7.  Thoroughly rinse your body with warm water from the neck down.  8.  DO NOT shower/wash with your normal soap after using and rinsing off the CHG Soap.                9.  Pat yourself dry with a clean towel.            10.  Wear clean pajamas.            11.  Place clean sheets on your bed the night of your first shower and do not  sleep with pets. Day of Surgery : Do not apply any lotions/deodorants the morning of surgery.  Please wear clean clothes to the hospital/surgery center.  FAILURE TO FOLLOW THESE INSTRUCTIONS MAY RESULT IN THE CANCELLATION OF YOUR SURGERY  PATIENT SIGNATURE_________________________________  NURSE SIGNATURE__________________________________  ________________________________________________________________________

## 2024-04-12 ENCOUNTER — Encounter (HOSPITAL_COMMUNITY): Payer: Self-pay | Admitting: Physician Assistant

## 2024-04-12 ENCOUNTER — Ambulatory Visit: Admitting: Physician Assistant

## 2024-04-12 MED ORDER — GENTAMICIN SULFATE 40 MG/ML IJ SOLN
5.0000 mg/kg | INTRAVENOUS | Status: DC
  Filled 2024-04-12: qty 18

## 2024-04-12 NOTE — H&P (Signed)
 03/17/24: Jeffrey Skinner is a 68 yo male who is sent by the [facility] for a rising PSA that was 6.34 in 1/24, 5.41 in 11/22 and 4.0 in 9/20. He ways 500lb and wasn't able to have an MRI at the [facility]. he had a 29ml prostate on a CT 6/24. He is on Zepbound and is losing some weight but he doesn't know how much. He is on lasix  and has a good stream but frequency. He has had a reddish hue in the urine 6-8 months ago but none since. He has had a UTI with the last about 8 months ago. He has passed a urinary stone. He has had no GU surgery.     ALLERGIES: No Allergies    MEDICATIONS: Furosemide   Spironolactone   Zepbound     GU PSH: None     PSH Notes: Ankle surgery   NON-GU PSH: Appendectomy Cholecystectomy (laparoscopic) Hernia Repair     GU PMH: None   NON-GU PMH: Anxiety Arthritis Asthma Heart disease, unspecified Hypertension Right heart failure, unspecified Sleep Apnea    FAMILY HISTORY: Cancer - Runs in Family Death In The Family Father - Other Death In The Family Mother - Other Prostate Cancer - Brother   SOCIAL HISTORY: Marital Status: Married Preferred Language: English; Ethnicity: Not Hispanic Or Latino; Race: Black or African American Current Smoking Status: Patient has never smoked.   Tobacco Use Assessment Completed: Used Tobacco in last 30 days? Does not use smokeless tobacco. Has never drank.  Does not use drugs. Drinks 1 caffeinated drink per day. 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 Patient denies burning/ pain with urination, get up at night to urinate, stream starts and stops, trouble starting your stream, and have to strain to urinate .  Gastrointestinal (Upper):   Patient denies nausea, vomiting, and indigestion/ heartburn.  Gastrointestinal (Lower):   Patient reports constipation. Patient denies diarrhea.   Constitutional:   Patient reports weight loss and fatigue. Patient denies fever and night sweats.  Skin:   Patient reports skin rash/ lesion and itching.   Eyes:   Patient reports blurred vision and double vision.   Ears/ Nose/ Throat:   Patient reports sore throat and sinus problems.   Hematologic/Lymphatic:   Patient reports swollen glands. Patient denies easy bruising.  Cardiovascular:   Patient reports leg swelling. Patient denies chest pains.  Respiratory:   Patient reports cough and shortness of breath.   Endocrine:   Patient reports excessive thirst.   Musculoskeletal:   Patient reports joint pain. Patient denies back pain.  Neurological:   Patient reports dizziness. Patient denies headaches.  Psychologic:   Patient reports depression and anxiety.    Notes: Urinary tract infection, sexually transmitted disease    VITAL SIGNS:      03/17/2024 02:58 PM  Weight 500 lb / 226.8 kg  Height 77 in / 195.58 cm  BP 123/85 mmHg  Heart Rate 101 /min  Temperature 96.9 F / 36.0 C  BMI 59.3 kg/m   GU PHYSICAL EXAMINATION:      Notes: He declined rectal exam   MULTI-SYSTEM PHYSICAL EXAMINATION:    Constitutional: Morbidly Obese. No physical deformities. Normally developed. Good grooming.   Respiratory: Normal breath sounds. No labored breathing, no use of accessory muscles.   Cardiovascular: Regular rate and rhythm. No murmur, no gallop. Extremity swelling. Normal  temperature  Lymphatic: No enlargement, no tenderness of supraclavicular lymph nodes.  Skin: No paleness, no jaundice, no cyanosis. No lesion, no ulcer, no rash.  Neurologic / Psychiatric: Oriented to time, oriented to place, oriented to person. No depression, no anxiety, no agitation.  Gastrointestinal: Obese abdomen. No mass, no tenderness, no rigidity.   Musculoskeletal: in wheelchair     Complexity of Data:  Source Of History:  Medical Record Summary  Lab Test Review:   PSA  X-Ray Review: C.T. Abdomen/Pelvis:  Reviewed Films. Reviewed Report. Discussed With Patient. prostate was 29ml on CT in 6/24.    Notes:                     HE was unable to get a UA.    PROCEDURES: None   ASSESSMENT:      ICD-10 Details  1 GU:   Elevated PSA - R97.20 Undiagnosed New Problem - His PSA has been rising for the last few years and his prostate was small on CT so his PSDT is elevated to >0.20 which is associated with a higher risk of prostate cancer. His last PSA was in 1/24 so I will repeat that today and if it remains elevated, he will need a biopsy. I don't think an MRI would add much since his prostate is small and I am not sure we could get one with his elevated weight. The PSA could also be impacted by his large plasma volume which can dilute the PSA.   If the PSA has continued to rise, I would plan to attempt a biopsy in the OR as our tables are not rated for his weight. I reviewed the risks of bleeding, infection and voiding difficulty.    PLAN:           Orders Labs PSA with Reflex          Schedule Labs: 4 Months - PSA with Reflex    4 Months - Urinalysis  Return Visit/Planned Activity: 4 Months - Office Visit          Document Letter(s):  Created for Patient: Clinical Summary         Notes:   CC: Dr. Grayce at [facility].         Next Appointment:      Next Appointment: 07/05/2024 01:30 PM    Appointment Type: Laboratory Appointment    Location: [facility] [phone]    Provider: Lab LAB    Reason for Visit: 4 mo PSA w . reflex

## 2024-04-12 NOTE — Progress Notes (Signed)
 Anesthesia Chart Review   Case: [id number] Date/Time: 04/13/24 0845   Procedures:      BIOPSY, PROSTATE, RECTAL APPROACH, WITH US  GUIDANCE     ULTRASOUND, RECTAL APPROACH   Anesthesia type: Monitor Anesthesia Care   Diagnosis: Elevated PSA [R97.20]   Pre-op diagnosis: ELEVATED PSA   Location: [facility] PROCEDURE RO[facility] / [facility]   Surgeons: Watt Rush, MD       DISCUSSION:68 y.o. never smoker with h/o HTN, sleep apnea, COPD, diastolic CHF, elevated PSA scheduled for above procedure 04/13/2024 with Dr. Rush Watt.   Pt last seen by cardiology with [facility] 10/15/2023. Pt admitted 12/2021 for acute respiratory failure, improved with BiPAP.  Echo at that time with preserved left ventricular systolic function and diastolic function normal, mild to moderate TR. Per last cardio note this episode related to obesity hypoventilation.  Per most recent cardio note pt doing well.  No changes made at this visit, continued on Spironolactone  25mg , Lasix  40mg  BID. 6 month follow up recommended. Per notes EKG 10/15/2023 with NSR, inferior t wave inversions, pr 162, prs 86, qtc 451. Tracing requested.   Pt reports he can exercise without chest pain or shortness of breath, unable to go up stairs due to knee and hip pain.   Cardio clearance requested by urology, has not been received.  VS: BP (!) 153/84   Pulse 87   Temp 36.5 C (Oral)   Resp 20   Ht 6' 4.5 (1.943 m)   Wt (!) 226.8 kg   SpO2 96%   BMI 60.07 kg/m   PROVIDERS: Pcp, No   LABS: Labs reviewed: Acceptable for surgery. (all labs ordered are listed, but only abnormal results are displayed)  Labs Reviewed  BASIC METABOLIC PANEL WITH GFR - Abnormal; Notable for the following components:      Result Value   Glucose, Bld 143 (*)    BUN 7 (*)    Calcium 8.8 (*)    All other components within normal limits  CBC - Abnormal; Notable for the following components:   WBC 11.2 (*)    Hemoglobin 12.9 (*)    All other components within normal limits      IMAGES:   EKG:   CV: Echo 01/23/2022 1. Left ventricular ejection fraction, by estimation, is 60 to 65%. The  left ventricle has normal function. The left ventricle has no regional  wall motion abnormalities. There is moderate left ventricular hypertrophy.  Left ventricular diastolic  parameters were normal.   2. Right ventricular systolic function is normal. The right ventricular  size is normal. There is normal pulmonary artery systolic pressure.   3. The mitral valve is normal in structure. No evidence of mitral valve  regurgitation. No evidence of mitral stenosis.   4. The aortic valve is tricuspid. Aortic valve regurgitation is not  visualized. No aortic stenosis is present.   5. Aortic dilatation noted. There is mild dilatation of the ascending  aorta, measuring 37 mm.   6. The inferior vena cava is dilated in size with <50% respiratory  variability, suggesting right atrial pressure of 15 mmHg.  Past Medical History:  Diagnosis Date   Acute diastolic CHF (congestive heart failure) (HCC) 06/14/2019   Anemia    Anxiety 12/16/2019   Anxiety 11/2019   Arthritis    COPD (chronic obstructive pulmonary disease) (HCC) 12/30/2020   Depression    Dyspnea    History of kidney stones    Hypertension    Insomnia 11/2029   Morbid  obesity with BMI of 60.0-69.9, adult (HCC)    Sleep apnea    Vitamin D  deficiency 06/2019    Past Surgical History:  Procedure Laterality Date   ANKLE SURGERY     APPENDECTOMY     CHOLECYSTECTOMY     HERNIA REPAIR     VASECTOMY  1998    MEDICATIONS:  albuterol  (VENTOLIN  HFA) 108 (90 Base) MCG/ACT inhaler   furosemide  (LASIX ) 40 MG tablet   ipratropium-albuterol  (DUONEB) 0.5-2.5 (3) MG/3ML SOLN   spironolactone  (ALDACTONE ) 25 MG tablet   tirzepatide (ZEPBOUND) 12.5 MG/0.5ML Pen   No current facility-administered medications for this encounter.    [START ON 04/13/2024] gentamicin  (GARAMYCIN ) 720 mg in dextrose  5 % 100 mL IVPB        [facility], PA-C [facility]Surgical [facility] [phone]

## 2024-04-12 NOTE — Progress Notes (Deleted)
  Cardiology Office Note   Date:  04/12/2024  ID:  LESHAWN STRAKA, DOB 12/30/1955, MRN [id number] PCP: Pcp, No  [facility] Providers Cardiologist:  None { Click to update primary MD,subspecialty MD or APP then REFRESH:1}    History of Present Illness Jeffrey Skinner is a 68 y.o. male with a past medical history of acute on chronic diastolic CHF, morbid obesity, and hypertension here for preop clearance.  Usually is in the [facility] in [place].  Was last seen by one of our providers in 2023.  Has obstructive sleep apnea.  Uses CPAP regularly.  Echocardiogram April 2022 showed normal left ventricular systolic function and normal LV diastolic function.  Admitted April 2023 with acute respiratory failure.  Had hypercarbic respiratory failure at that time.  Improved with BiPAP.  Echo revealed preserved LVEF with diastolic function also normal.  Mild to moderate TR.  He was diuresed 8.6 L at that time.  Avoid salt but eats lots of carbohydrates.  Lost 28 pounds since his discharge from the hospital.  He was working out with PT/OT.  Today, he***  Preop clearance  ROS: ***  Studies Reviewed      *** Risk Assessment/Calculations {Does this patient have ATRIAL FIBRILLATION?:[phone]} No BP recorded.  {Refresh Note OR Click here to enter BP  :1}***       Physical Exam VS:  There were no vitals taken for this visit.       Wt Readings from Last 3 Encounters:  04/08/24 (!) 500 lb (226.8 kg)  03/12/22 (!) 505 lb (229.1 kg)  03/04/22 (!) 505 lb (229.1 kg)    GEN: Well nourished, well developed in no acute distress NECK: No JVD; No carotid bruits CARDIAC: ***RRR, no murmurs, rubs, gallops RESPIRATORY:  Clear to auscultation without rales, wheezing or rhonchi  ABDOMEN: Soft, non-tender, non-distended EXTREMITIES:  No edema; No deformity   ASSESSMENT AND PLAN Preop clearance  {Click Here to Calculate RCRI      :[id number]}  { Click Here to Calculate DASI       :789639253} {Select to add RCRI Risk (<1%=LOW; >/=1%=HIGH) (Optional):21036017}  {Select if HIGH (RCRI >/=1%) Risk (Optional):21036030} Recommendations: {2014 ACC/AHA Perioperative Guidelines  :21036001} Antiplatelet and/or Anticoagulation Recommendations: {Antiplatelet Recommendations                  :21036016} {Anticoagulation Recommendations           :78963980}   Obesity  Hypertension    {Are you ordering a CV Procedure (e.g. stress test, cath, DCCV, TEE, etc)?   Press F2        :[id number]}  Dispo: ***  Signed, Orren LOISE Fabry, PA-C

## 2024-04-13 ENCOUNTER — Ambulatory Visit (HOSPITAL_COMMUNITY): Admission: RE | Admit: 2024-04-13 | Source: Home / Self Care | Admitting: Urology

## 2024-04-13 ENCOUNTER — Encounter (HOSPITAL_COMMUNITY): Admission: RE | Payer: Self-pay | Source: Home / Self Care

## 2024-04-13 ENCOUNTER — Ambulatory Visit (HOSPITAL_COMMUNITY): Admission: RE | Admit: 2024-04-13 | Source: Ambulatory Visit

## 2024-04-13 SURGERY — BIOPSY, PROSTATE, RECTAL APPROACH, WITH US GUIDANCE
Anesthesia: Monitor Anesthesia Care
